# Patient Record
Sex: Male | Born: 1955 | Race: White | Hispanic: No | Marital: Married | Smoking: Never smoker
Health system: Southern US, Community
[De-identification: ages and names within clinical notes are randomized; demographics above are authoritative.]

## PROBLEM LIST (undated history)

## (undated) DIAGNOSIS — E119 Type 2 diabetes mellitus without complications: Secondary | ICD-10-CM

## (undated) DIAGNOSIS — I10 Essential (primary) hypertension: Secondary | ICD-10-CM

## (undated) HISTORY — PX: OTHER SURGICAL HISTORY: SHX169

## (undated) HISTORY — PX: TONSILLECTOMY: SUR1361

## (undated) HISTORY — DX: Essential (primary) hypertension: I10

## (undated) HISTORY — DX: Type 2 diabetes mellitus without complications: E11.9

---

## 2003-09-07 ENCOUNTER — Ambulatory Visit (HOSPITAL_COMMUNITY): Admission: RE | Admit: 2003-09-07 | Discharge: 2003-09-07 | Payer: Self-pay | Admitting: Family Medicine

## 2005-09-05 ENCOUNTER — Ambulatory Visit: Payer: Self-pay | Admitting: Sports Medicine

## 2005-09-05 ENCOUNTER — Observation Stay (HOSPITAL_COMMUNITY): Admission: EM | Admit: 2005-09-05 | Discharge: 2005-09-06 | Payer: Self-pay | Admitting: Emergency Medicine

## 2005-09-24 ENCOUNTER — Encounter: Admission: RE | Admit: 2005-09-24 | Discharge: 2005-09-24 | Payer: Self-pay | Admitting: Family Medicine

## 2009-03-20 ENCOUNTER — Encounter (HOSPITAL_BASED_OUTPATIENT_CLINIC_OR_DEPARTMENT_OTHER): Admission: RE | Admit: 2009-03-20 | Discharge: 2009-05-15 | Payer: Self-pay | Admitting: Internal Medicine

## 2009-03-21 ENCOUNTER — Ambulatory Visit (HOSPITAL_COMMUNITY): Admission: RE | Admit: 2009-03-21 | Discharge: 2009-03-21 | Payer: Self-pay | Admitting: Internal Medicine

## 2009-03-22 ENCOUNTER — Encounter (HOSPITAL_BASED_OUTPATIENT_CLINIC_OR_DEPARTMENT_OTHER): Payer: Self-pay | Admitting: Internal Medicine

## 2009-03-22 ENCOUNTER — Ambulatory Visit: Admission: RE | Admit: 2009-03-22 | Discharge: 2009-03-22 | Payer: Self-pay | Admitting: Internal Medicine

## 2009-03-22 ENCOUNTER — Ambulatory Visit: Payer: Self-pay | Admitting: Vascular Surgery

## 2009-03-25 ENCOUNTER — Emergency Department (HOSPITAL_BASED_OUTPATIENT_CLINIC_OR_DEPARTMENT_OTHER): Admission: EM | Admit: 2009-03-25 | Discharge: 2009-03-25 | Payer: Self-pay | Admitting: Emergency Medicine

## 2010-10-04 LAB — GLUCOSE, CAPILLARY: Glucose-Capillary: 180 mg/dL — ABNORMAL HIGH (ref 70–99)

## 2010-11-16 NOTE — Discharge Summary (Signed)
[name], [name]                ACCOUNT NO.:  [id number]   MEDICAL RECORD NO.:  [id number]          PATIENT TYPE:  INP   LOCATION:  6524                         FACILITY:  [facility]   PHYSICIAN:  Pearlean Brownie, M.D.DATE OF BIRTH:  1955-12-09   DATE OF ADMISSION:  09/05/2005  DATE OF DISCHARGE:  09/06/2005                                 DISCHARGE SUMMARY   PRIMARY CARE PHYSICIAN:  [facility], phone number [phone].   DISCHARGE DIAGNOSES:  1.  Chest pain, likely gastroesophageal reflux disease.  2.  New onset type 2 diabetes mellitus.  3.  Hypertension.  4.  Hyperlipidemia.   PROCEDURE:  Chest x-ray on September 05, 2005, showed no active lung disease and  mild cardiomegaly.   LABORATORY DATA:  Cardiac enzymes were negative times three. EKG was normal  sinus rhythm. Hemoglobin A1c was 11.5, creatinine 0.9.  chest x-ray showed  no acute processes. TSH was 2.043. LFTs were within normal limits. Lipid  panel revealed total cholesterol of 215, triglycerides 154, HDL 39, LDL 145.  D-dimer was within normal limits. Urinalysis negative for protein, nitrite,  or leukocyte esterase. Specific gravity elevated at 1.038.   DISCHARGE MEDICATIONS:  1.  Metformin 500 mg p.o. daily.  2.  Amaryl 2 mg p.o. daily.  3.  Aspirin 81 mg p.o. daily.  4.  Lipitor 40 mg p.o. daily.   BRIEF HISTORY OF PRESENT ILLNESS:  The patient is a 55 year old male with  past medical history significant for hypertension, who had 30 to 45 minutes  of chest discomfort described as severe pressure. The patient was seen at  [facility], was given aspirin, and sent to the emergency department  for further evaluation. The patient noted a history of having been found to  have high cholesterol as well as elevated blood sugars at an insurance  physical several weeks ago. The patient has yet to follow up with his  primary care physician at [facility]. The patient was  admitted to the  hospital for chest pain and to rule out MI.   HOSPITAL COURSE:  Problem #1:  Chest pain. The patient's cardiac enzymes  were negative times three. His EKG was normal sinus rhythm on two occasions.  The patient's chest pain at this point was felt to be secondary to GERD.  Would recommend outpatient exercise treadmill testing for the patient.   Problem #2:  New onset type 2 diabetes mellitus. The patient had an elevated  blood sugar at 389 in the emergency department. Repeat blood sugars remained  elevated as well. The patient's hemoglobin A1c was 11.5.  The patient was  started on Metformin 500 mg daily as well as Amaryl 2 mg p.o. daily. The  patient received diabetic teaching while in the hospital and will be  referred to outpatient diabetic education. The patient was also started on  aspirin 81 mg once daily.   Problem #3:  Hypertension. The patient's blood pressures were 130 systolic  over 80s. The patient's new goal blood pressures were 120/80 with diabetes,  however this can be followed as an outpatient.  Problem #4:  Hyperlipidemia. The patient's LDL of 145 and HDL of 39 are  below goal for someone with diabetes.  The patient was started on Lipitor 40  mg p.o.   DISCHARGE INSTRUCTIONS:  Carbohydrate-modified diet.   DISCHARGE FOLLOWUP:  With [facility], phone number [phone],  for an appointment as previously scheduled on Friday, September 13, 2005.   FOLLOWUP ISSUES:  Outpatient cardiac testing as well as diabetes management.      Benn Moulder, M.D.    ______________________________  Pearlean Brownie, M.D.    MR/MEDQ  D:  09/06/2005  T:  09/07/2005  Job:  [id number]   cc:   [facility]

## 2010-11-16 NOTE — H&P (Signed)
[name], [name]                ACCOUNT NO.:  [id number]   MEDICAL RECORD NO.:  [id number]          PATIENT TYPE:  INP   LOCATION:  6524                         FACILITY:  [facility]   PHYSICIAN:  Pearlean Brownie, M.D.DATE OF BIRTH:  10-Mar-1956   DATE OF ADMISSION:  09/05/2005  DATE OF DISCHARGE:                                HISTORY & PHYSICAL   PRIMARY CARE PHYSICIAN:  [facility].   CHIEF COMPLAINT:  Chest pain.   HISTORY OF PRESENT ILLNESS:  The patient is a 55 year old white male with a  history of hypertension who developed chest discomfort over his sternum  which radiated to his left chest shortly after lunch while driving back to  work.  The patient described the pain as pressure and it lasted for about 45  minutes.  It was not associated with any nausea, vomiting, or diaphoresis.  The patient was seen at Urgent Care and given aspirin before being sent to  the emergency department.  The patient states he has been stressed at work  lately and has been working exceptionally long hours.  He has had episodes  of chest pain in the past two to three years ago after injuring his  shoulder.  He had a thorough workup which included a negative exercise  treadmill test, per the patient, at that time.  Chest pain at that point was  felt to be likely musculoskeletal and/or heartburn.  Also of note, the  patient says he was found to have high cholesterol and increased blood sugar  at a recent insurance physical two to three weeks ago.  He has not followed  up with his primary MD for this yet, although he has an appointment  scheduled for next week.  The patient has been on Norvasc before for his  hypertension, but is currently controlled with diet and exercise.  He is a  non-smoker.   REVIEW OF SYSTEMS:  No nausea or diaphoresis.  No shortness of breath.  No  abdominal pain.  No palpitations.  No headaches.  No polyuria.  No  polydipsia.   PAST MEDICAL HISTORY:  1.   Hypertension.  2.  Questionable hyperlipidemia.  3.  Questionable hyperglycemia.   MEDICATIONS:  None.   ALLERGIES:  No known drug allergies.   FAMILY HISTORY:  Dad with type 2 diabetes mellitus, on oral medications, as  well as hypertension.  He is alive at age 30.  Mom age 6, without  hypertension, diabetes, or heart attacks.  Brother died at age 70 of a MI.   SOCIAL HISTORY:  The patient works as a Visual merchandiser.  He denies  any alcohol, tobacco history, or drug use.  He is currently married.   PHYSICAL EXAMINATION:  VITAL SIGNS:  Temperature 98.7, pulse 82, blood  pressure 144/85, respiratory rate 20, 98% on room air.  GENERAL:  Pleasant, in no acute distress.  HEENT:  Pupils equal, round, reactive to light and accommodation.  Extraocular movements were intact.  Mucous membranes are moist.  Sclerae are  anicteric.  NECK:  No thyromegaly or thyroid nodules.  No carotid  bruits.  LUNGS:  Clear to auscultation bilaterally.  CHEST:  Chest wall nontender to palpation.  CARDIOVASCULAR:  Regular rate and rhythm, no murmurs, rubs, or gallops.  ABDOMEN:  Positive bowel sounds, soft, nontender, nondistended, no  hepatosplenomegaly.  No mid-epigastric tenderness.  EXTREMITIES:  2+ dorsalis pedis pulses bilaterally, 2+ radial pulses  bilaterally.  No cyanosis, clubbing, or edema.  No foot ulcerations.  NEUROLOGIC:  Alert and oriented x3.  Cranial nerves II-XII intact.  The  patient with 5/5 upper and lower extremity strength bilaterally.  DTR 2+  bilaterally symmetric.   LABORATORY DATA:  Sodium 136, potassium 4.4, chloride 105, bicarbonate 26,  BUN 18, creatinine 0.9, glucose 387.  Point-of-care cardiac enzymes:  Myoglobin 66.1, CK-MB less than 1, troponin less than 0.05.  EKG with normal  sinus rhythm at 86 beats per minute.  Chest x-ray showed no acute  cardiopulmonary process.  Urinalysis is still pending.   ASSESSMENT AND PLAN:  A 55 year old male with atypical angina  and  hyperglycemia.  1.  Chest pain.  Atypical angina on his left side, but not associated with      exertional_____________.  May be brought on by stress.  Differential      diagnosis includes myocardial infarction, coronary artery disease,      angina, gastroesophageal reflux disease, or chest pain secondary to      anxiety or stress.  Risk factors for the patient include hypertension,      possible hyperlipidemia, likely diabetes mellitus, as well as a positive      family history of early heart disease.  No tobacco use.  We will admit      the patient to a telemetry bed, cycle his cardiac enzymes.  We will      recheck his EKG in the morning.  The patient is meeting risks for      coronary artery disease and will need outpatient exercise treadmill test      if myocardial infarction is ruled out.  2.  Hyperglycemia.  The patient likely with type 2 diabetes.  Received 6      units of Novolog in the emergency department.  We will follow his CBG's      q.a.c. and q.h.s.  We will check a hemoglobin A1C, as well as TSH.  His      urinalysis is pending to look for protein and ketones.  I will consider      starting Metformin in the morning.  3.  Hyperlipidemia.  We will check a fasting lipid panel in the morning.      His LDL goal is likely 100 with type 2 diabetes.  We will also check a      CMET to make sure that his liver function tests are okay before starting      possible statin therapy.  4.  Hypertension.  The patient was given Toprol XL 25 mg x1 in the emergency      room.  We will follow his blood pressures in the hospital, and continue      the Toprol XL if needed.  The patient will be on low salt diet.  5.  Gastrointestinal.  The patient with no prior history of gastroesophageal      reflux disease, although this is certainly a possibility.  We will start      him on Protonix.      Benn Moulder, M.D.    ______________________________ Pearlean Brownie, M.D.    MR/MEDQ  D:  09/05/2005  T:  09/05/2005  Job:  [id number]

## 2013-05-23 DIAGNOSIS — E113299 Type 2 diabetes mellitus with mild nonproliferative diabetic retinopathy without macular edema, unspecified eye: Secondary | ICD-10-CM | POA: Insufficient documentation

## 2013-05-23 DIAGNOSIS — I10 Essential (primary) hypertension: Secondary | ICD-10-CM | POA: Insufficient documentation

## 2018-12-16 ENCOUNTER — Encounter (HOSPITAL_BASED_OUTPATIENT_CLINIC_OR_DEPARTMENT_OTHER): Payer: 59 | Attending: Physician Assistant

## 2018-12-16 DIAGNOSIS — E1140 Type 2 diabetes mellitus with diabetic neuropathy, unspecified: Secondary | ICD-10-CM | POA: Insufficient documentation

## 2018-12-16 DIAGNOSIS — E11621 Type 2 diabetes mellitus with foot ulcer: Secondary | ICD-10-CM | POA: Insufficient documentation

## 2018-12-16 DIAGNOSIS — L97512 Non-pressure chronic ulcer of other part of right foot with fat layer exposed: Secondary | ICD-10-CM | POA: Diagnosis not present

## 2018-12-16 DIAGNOSIS — I10 Essential (primary) hypertension: Secondary | ICD-10-CM | POA: Diagnosis not present

## 2018-12-16 DIAGNOSIS — Z794 Long term (current) use of insulin: Secondary | ICD-10-CM | POA: Insufficient documentation

## 2018-12-23 ENCOUNTER — Ambulatory Visit (HOSPITAL_COMMUNITY)
Admission: RE | Admit: 2018-12-23 | Discharge: 2018-12-23 | Disposition: A | Discharge status: Home / Self Care | Payer: 59 | Source: Ambulatory Visit | Attending: Physician Assistant | Admitting: Physician Assistant

## 2018-12-23 ENCOUNTER — Other Ambulatory Visit (HOSPITAL_COMMUNITY): Payer: Self-pay | Admitting: Physician Assistant

## 2018-12-23 ENCOUNTER — Other Ambulatory Visit: Payer: Self-pay

## 2018-12-23 DIAGNOSIS — L97512 Non-pressure chronic ulcer of other part of right foot with fat layer exposed: Secondary | ICD-10-CM | POA: Diagnosis present

## 2018-12-23 DIAGNOSIS — E11621 Type 2 diabetes mellitus with foot ulcer: Secondary | ICD-10-CM | POA: Diagnosis not present

## 2018-12-25 DIAGNOSIS — E11621 Type 2 diabetes mellitus with foot ulcer: Secondary | ICD-10-CM | POA: Diagnosis not present

## 2018-12-29 DIAGNOSIS — E11621 Type 2 diabetes mellitus with foot ulcer: Secondary | ICD-10-CM | POA: Diagnosis not present

## 2018-12-30 ENCOUNTER — Other Ambulatory Visit: Payer: Self-pay

## 2018-12-30 ENCOUNTER — Encounter (HOSPITAL_BASED_OUTPATIENT_CLINIC_OR_DEPARTMENT_OTHER): Payer: 59 | Attending: Physician Assistant

## 2018-12-30 DIAGNOSIS — S91001A Unspecified open wound, right ankle, initial encounter: Secondary | ICD-10-CM | POA: Insufficient documentation

## 2018-12-30 DIAGNOSIS — I10 Essential (primary) hypertension: Secondary | ICD-10-CM | POA: Insufficient documentation

## 2018-12-30 DIAGNOSIS — E1140 Type 2 diabetes mellitus with diabetic neuropathy, unspecified: Secondary | ICD-10-CM | POA: Diagnosis not present

## 2018-12-30 DIAGNOSIS — E11621 Type 2 diabetes mellitus with foot ulcer: Secondary | ICD-10-CM | POA: Diagnosis present

## 2018-12-30 DIAGNOSIS — X58XXXA Exposure to other specified factors, initial encounter: Secondary | ICD-10-CM | POA: Diagnosis not present

## 2018-12-30 DIAGNOSIS — Z794 Long term (current) use of insulin: Secondary | ICD-10-CM | POA: Insufficient documentation

## 2018-12-30 DIAGNOSIS — L97412 Non-pressure chronic ulcer of right heel and midfoot with fat layer exposed: Secondary | ICD-10-CM | POA: Insufficient documentation

## 2019-01-06 DIAGNOSIS — E11621 Type 2 diabetes mellitus with foot ulcer: Secondary | ICD-10-CM | POA: Diagnosis not present

## 2019-01-14 DIAGNOSIS — E11621 Type 2 diabetes mellitus with foot ulcer: Secondary | ICD-10-CM | POA: Diagnosis not present

## 2019-01-20 DIAGNOSIS — E11621 Type 2 diabetes mellitus with foot ulcer: Secondary | ICD-10-CM | POA: Diagnosis not present

## 2019-02-01 ENCOUNTER — Encounter (HOSPITAL_BASED_OUTPATIENT_CLINIC_OR_DEPARTMENT_OTHER): Payer: 59 | Attending: Internal Medicine

## 2019-02-01 DIAGNOSIS — I10 Essential (primary) hypertension: Secondary | ICD-10-CM | POA: Insufficient documentation

## 2019-02-01 DIAGNOSIS — L97512 Non-pressure chronic ulcer of other part of right foot with fat layer exposed: Secondary | ICD-10-CM | POA: Insufficient documentation

## 2019-02-01 DIAGNOSIS — L84 Corns and callosities: Secondary | ICD-10-CM | POA: Insufficient documentation

## 2019-02-01 DIAGNOSIS — E11621 Type 2 diabetes mellitus with foot ulcer: Secondary | ICD-10-CM | POA: Insufficient documentation

## 2019-02-01 DIAGNOSIS — X58XXXA Exposure to other specified factors, initial encounter: Secondary | ICD-10-CM | POA: Insufficient documentation

## 2019-02-01 DIAGNOSIS — S91001A Unspecified open wound, right ankle, initial encounter: Secondary | ICD-10-CM | POA: Diagnosis not present

## 2019-02-02 ENCOUNTER — Other Ambulatory Visit: Payer: Self-pay

## 2019-02-08 DIAGNOSIS — E11621 Type 2 diabetes mellitus with foot ulcer: Secondary | ICD-10-CM | POA: Diagnosis not present

## 2019-02-22 DIAGNOSIS — E11621 Type 2 diabetes mellitus with foot ulcer: Secondary | ICD-10-CM | POA: Diagnosis not present

## 2019-02-23 DIAGNOSIS — H52222 Regular astigmatism, left eye: Secondary | ICD-10-CM | POA: Insufficient documentation

## 2019-02-23 DIAGNOSIS — H25812 Combined forms of age-related cataract, left eye: Secondary | ICD-10-CM | POA: Insufficient documentation

## 2019-12-15 NOTE — Progress Notes (Signed)
Referring-Stephen Olen Pel MD Reason for referral-Chest pain  HPI: 64 yo male for evaluation of CP at request of Orpah Melter MD. Seen at [facility] in past for PVCs.  Patient states that for the past 1 year he has noticed increased fatigue.  He has some dyspnea on exertion but no orthopnea, PND, pedal edema or syncope.  In the past several months he has had occasional substernal chest pain.  It does not radiate nor are there associated symptoms.  It is not exertional.  Lasts approximately 5 minutes and resolve spontaneously.  He does not have exertional chest pain.  Cardiology now asked to evaluate.  Current Outpatient Medications  Medication Sig Dispense Refill   lisinopril (ZESTRIL) 20 MG tablet Take 20 mg by mouth daily.     Semaglutide,0.25 or 0.5MG /DOS, (OZEMPIC, 0.25 OR 0.5 MG/DOSE,) 2 MG/1.5ML SOPN Inject 0.25 mg into the skin once a week.     sitaGLIPtin-metformin (JANUMET) 50-1000 MG tablet Take 1 tablet by mouth 2 (two) times daily.     aspirin EC 81 MG tablet Take 1 tablet (81 mg total) by mouth daily. Swallow whole. 90 tablet 3   No current facility-administered medications for this visit.    No Known Allergies   Past Medical History:  Diagnosis Date   Diabetes mellitus without complication ([place])    Hypertension     Past Surgical History:  Procedure Laterality Date   Arthroscopic knee surgery      Social History   Socioeconomic History   Marital status: Married    Spouse name: Not on file   Number of children: 4   Years of education: Not on file   Highest education level: Not on file  Occupational History   Not on file  Tobacco Use   Smoking status: Never Smoker   Smokeless tobacco: Never Used   Tobacco comment: expose to passive smoke  Vaping Use   Vaping Use: Never used  Substance and Sexual Activity   Alcohol use: Yes    Comment: occasionally   Drug use: Never   Sexual activity: Yes  Other Topics Concern   Not on file  Social  History Narrative   Not on file   Social Determinants of Health   Financial Resource Strain:    Difficulty of Paying Living Expenses:   Food Insecurity:    Worried About Charity fundraiser in the Last Year:    Arboriculturist in the Last Year:   Transportation Needs:    Film/video editor (Medical):    Lack of Transportation (Non-Medical):   Physical Activity:    Days of Exercise per Week:    Minutes of Exercise per Session:   Stress:    Feeling of Stress :   Social Connections:    Frequency of Communication with Friends and Family:    Frequency of Social Gatherings with Friends and Family:    Attends Religious Services:    Active Member of Clubs or Organizations:    Attends Music therapist:    Marital Status:   Intimate Partner Violence:    Fear of Current or Ex-Partner:    Emotionally Abused:    Physically Abused:    Sexually Abused:     Family History  Problem Relation Age of Onset   CAD Father    CAD Brother     ROS: no fevers or chills, productive cough, hemoptysis, dysphasia, odynophagia, melena, hematochezia, dysuria, hematuria, rash, seizure activity, orthopnea, PND, pedal edema, claudication.  Remaining systems are negative.  Physical Exam:   Blood pressure (!) 140/96, pulse 76, height 5\' 10"  (1.778 m), weight 222 lb (100.7 kg), SpO2 95 %.  General:  Well developed/well nourished in NAD Skin warm/dry Patient not depressed No peripheral clubbing Back-normal HEENT-normal/normal eyelids Neck supple/normal carotid upstroke bilaterally; no bruits; no JVD; no thyromegaly chest - CTA/ normal expansion CV - RRR/normal S1 and S2; no murmurs, rubs or gallops;  PMI nondisplaced Abdomen -NT/ND, no HSM, no mass, + bowel sounds, no bruit 2+ femoral pulses, no bruits Ext-no edema, chords, 2+ DP Neuro-grossly nonfocal  ECG -occasional PVC, nonspecific ST changes.  Personally reviewed  A/P  1 CP-symptoms are somewhat  atypical.  However he has multiple risk factors including strong family history of coronary disease as well as diabetes mellitus.  I will arrange a CTA to further assess.  2 hypertension-BP elevated; however he is not taking his lisinopril regularly.  He will begin and we will follow blood pressure and increase medications as needed.  3 hyperlipidemia-we will obtain most recent lipids from primary care.  Low threshold for statin given diabetes mellitus and family history.  4 DM-per primary care.  , MD

## 2019-12-16 ENCOUNTER — Encounter: Payer: Self-pay | Admitting: *Deleted

## 2019-12-16 DIAGNOSIS — R079 Chest pain, unspecified: Secondary | ICD-10-CM

## 2019-12-16 DIAGNOSIS — R5383 Other fatigue: Secondary | ICD-10-CM | POA: Insufficient documentation

## 2019-12-16 NOTE — Progress Notes (Signed)
Notes fax to [facility] [phone]

## 2019-12-22 ENCOUNTER — Other Ambulatory Visit: Payer: Self-pay

## 2019-12-22 ENCOUNTER — Encounter: Payer: Self-pay | Admitting: Cardiology

## 2019-12-22 ENCOUNTER — Ambulatory Visit (INDEPENDENT_AMBULATORY_CARE_PROVIDER_SITE_OTHER): Payer: 59 | Admitting: Cardiology

## 2019-12-22 VITALS — BP 140/96 | HR 76 | Ht 70.0 in | Wt 222.0 lb

## 2019-12-22 DIAGNOSIS — E7800 Pure hypercholesterolemia, unspecified: Secondary | ICD-10-CM

## 2019-12-22 DIAGNOSIS — I10 Essential (primary) hypertension: Secondary | ICD-10-CM

## 2019-12-22 DIAGNOSIS — R079 Chest pain, unspecified: Secondary | ICD-10-CM | POA: Diagnosis not present

## 2019-12-22 DIAGNOSIS — R072 Precordial pain: Secondary | ICD-10-CM | POA: Diagnosis not present

## 2019-12-22 MED ORDER — ASPIRIN EC 81 MG PO TBEC: 81.0000 mg | DELAYED_RELEASE_TABLET | Freq: Every day | ORAL | 3 refills | Status: AC

## 2019-12-22 MED ORDER — METOPROLOL TARTRATE 100 MG PO TABS: ORAL_TABLET | ORAL | 0 refills | Status: AC

## 2019-12-22 NOTE — Patient Instructions (Signed)
Medication Instructions:   START ASPIRIN 81 MG ONCE DAILY  *If you need a refill on your cardiac medications before your next appointment, please call your pharmacy*   Lab Work: If you have labs (blood work) drawn today and your tests are completely normal, you will receive your results only by:  [place] (if you have MyChart) OR  A paper copy in the mail If you have any lab test that is abnormal or we need to change your treatment, we will call you to review the results.   Testing/Procedures:  Your cardiac CT will be scheduled at one of the below locations:   [facility] [address] [place], [place] [address] [phone]  If scheduled at [facility], please arrive at the [facility] main entrance of [facility] 30 minutes prior to test start time. Proceed to the [facility] Radiology Department (first floor) to check-in and test prep.  Please follow these instructions carefully (unless otherwise directed):  Hold all erectile dysfunction medications at least 3 days (72 hrs) prior to test.  On the Night Before the Test:  Be sure to Drink plenty of water.  Do not consume any caffeinated/decaffeinated beverages or chocolate 12 hours prior to your test.  Do not take any antihistamines 12 hours prior to your test.  If the patient has contrast allergy:  On the Day of the Test:  Drink plenty of water. Do not drink any water within one hour of the test.  Do not eat any food 4 hours prior to the test.  You may take your regular medications prior to the test.   Take metoprolol (Lopressor) 100 MG two hours prior to test.      After the Test:  Drink plenty of water.  After receiving IV contrast, you may experience a mild flushed feeling. This is normal.  On occasion, you may experience a mild rash up to 24 hours after the test. This is not dangerous. If this occurs, you can take Benadryl 25 mg and increase your fluid intake.  If  you experience trouble breathing, this can be serious. If it is severe call 911 IMMEDIATELY. If it is mild, please call our office.  If you take any of these medications: Glipizide/Metformin, Avandament, Glucavance, please do not take 48 hours after completing test unless otherwise instructed.   Once we have confirmed authorization from your insurance company, we will call you to set up a date and time for your test.   For non-scheduling related questions, please contact the cardiac imaging nurse navigator should you have any questions/concerns: Marchia Bond, Cardiac Imaging Nurse Navigator Burley Saver, Interim Cardiac Imaging Nurse [place] [facility] Direct Office Dial: [phone]   For scheduling needs, including cancellations and rescheduling, please call [phone].      Follow-Up: At [facility], you and your health needs are our priority.  As part of our continuing mission to provide you with exceptional heart care, we have created designated Provider Care Teams.  These Care Teams include your primary Cardiologist (physician) and Advanced Practice Providers (APPs -  Physician Assistants and Nurse Practitioners) who all work together to provide you with the care you need, when you need it.  We recommend signing up for the patient portal called "MyChart".  Sign up information is provided on this After Visit Summary.  MyChart is used to connect with patients for Virtual Visits (Telemedicine).  Patients are able to view lab/test results, encounter  notes, upcoming appointments, etc.  Non-urgent messages can be sent to your provider as well.   To learn more about what you can do with MyChart, go to [email or web address].    Your next appointment:   3 month(s)  The format for your next appointment:   In Person  Provider:   Kirk Ruths, MD

## 2020-01-05 ENCOUNTER — Telehealth: Payer: Self-pay | Admitting: *Deleted

## 2020-01-05 DIAGNOSIS — E7800 Pure hypercholesterolemia, unspecified: Secondary | ICD-10-CM

## 2020-01-05 MED ORDER — ROSUVASTATIN CALCIUM 20 MG PO TABS: 20.0000 mg | ORAL_TABLET | Freq: Every day | ORAL | 3 refills | Status: DC

## 2020-01-05 NOTE — Telephone Encounter (Signed)
Lab work drawn 05/04/19 at [facility] reviewed by dr Jens Som, LDL=95. Dr Jens Som would like patient to start crestor 20 mg once daily with lipid/liver lab work in 12 weeks.   Spoke with pt, Aware of dr Ludwig Clarks recommendations. New script sent to the pharmacy AND Lab orders mailed to the pt

## 2020-01-13 ENCOUNTER — Other Ambulatory Visit: Payer: Self-pay | Admitting: *Deleted

## 2020-01-13 DIAGNOSIS — R079 Chest pain, unspecified: Secondary | ICD-10-CM

## 2020-01-13 DIAGNOSIS — R072 Precordial pain: Secondary | ICD-10-CM

## 2020-01-19 ENCOUNTER — Telehealth: Payer: Self-pay | Admitting: Cardiology

## 2020-01-19 NOTE — Telephone Encounter (Signed)
Pt mentionned that he is claustrophobic and might need something to calm him down. Has CT Scan coming up on 01/24/20

## 2020-01-19 NOTE — Telephone Encounter (Signed)
Will forward to Dr Crenshaw for review ./cy 

## 2020-01-20 NOTE — Telephone Encounter (Signed)
CTA is quick and should not require sedation [name]

## 2020-01-20 NOTE — Telephone Encounter (Signed)
Spoke with pt, Aware of dr crenshaw's recommendations.  °

## 2020-01-21 ENCOUNTER — Encounter: Payer: Self-pay | Admitting: *Deleted

## 2020-01-21 ENCOUNTER — Telehealth (HOSPITAL_COMMUNITY): Payer: Self-pay | Admitting: *Deleted

## 2020-01-21 LAB — BASIC METABOLIC PANEL
BUN/Creatinine Ratio: 22 (ref 10–24)
BUN: 27 mg/dL (ref 8–27)
CO2: 24 mmol/L (ref 20–29)
Calcium: 9.8 mg/dL (ref 8.6–10.2)
Chloride: 102 mmol/L (ref 96–106)
Creatinine, Ser: 1.21 mg/dL (ref 0.76–1.27)
GFR calc Af Amer: 73 mL/min/{1.73_m2} (ref >59)
GFR calc non Af Amer: 63 mL/min/{1.73_m2} (ref >59)
Glucose: 269 mg/dL — ABNORMAL HIGH (ref 65–99)
Potassium: 4.8 mmol/L (ref 3.5–5.2)
Sodium: 141 mmol/L (ref 134–144)

## 2020-01-21 NOTE — Telephone Encounter (Signed)
Reaching out to patient to offer assistance regarding upcoming cardiac imaging study; pt verbalizes understanding of appt date/time, parking situation and where to check in, pre-test NPO status and medications ordered, and verified current allergies; name and call back number provided for further questions should they arise ° °[name] Tai RN Navigator Cardiac Imaging °[facility] °[phone] office °[phone] cell ° °

## 2020-01-24 ENCOUNTER — Encounter (HOSPITAL_COMMUNITY): Payer: Self-pay

## 2020-01-24 ENCOUNTER — Ambulatory Visit (HOSPITAL_COMMUNITY)
Admission: RE | Admit: 2020-01-24 | Discharge: 2020-01-24 | Disposition: A | Discharge status: Home / Self Care | Payer: 59 | Source: Ambulatory Visit | Attending: Cardiology | Admitting: Cardiology

## 2020-01-24 ENCOUNTER — Other Ambulatory Visit: Payer: Self-pay

## 2020-01-24 DIAGNOSIS — R072 Precordial pain: Secondary | ICD-10-CM | POA: Diagnosis present

## 2020-01-24 MED ORDER — IOHEXOL 350 MG/ML SOLN
100.0000 mL | Freq: Once | INTRAVENOUS | Status: AC | PRN
  Administered 2020-01-24: 100 mL via INTRAVENOUS

## 2020-01-24 MED ORDER — NITROGLYCERIN 0.4 MG SL SUBL
SUBLINGUAL_TABLET | SUBLINGUAL | Status: AC
  Filled 2020-01-24: qty 2

## 2020-01-24 MED ORDER — NITROGLYCERIN 0.4 MG SL SUBL
0.8000 mg | SUBLINGUAL_TABLET | Freq: Once | SUBLINGUAL | Status: AC
  Administered 2020-01-24: 0.8 mg via SUBLINGUAL

## 2020-01-25 ENCOUNTER — Encounter (HOSPITAL_BASED_OUTPATIENT_CLINIC_OR_DEPARTMENT_OTHER): Payer: 59 | Attending: Physician Assistant | Admitting: Internal Medicine

## 2020-01-25 ENCOUNTER — Encounter (HOSPITAL_BASED_OUTPATIENT_CLINIC_OR_DEPARTMENT_OTHER): Payer: 59 | Admitting: Internal Medicine

## 2020-01-25 DIAGNOSIS — I10 Essential (primary) hypertension: Secondary | ICD-10-CM | POA: Diagnosis not present

## 2020-01-25 DIAGNOSIS — E1136 Type 2 diabetes mellitus with diabetic cataract: Secondary | ICD-10-CM | POA: Insufficient documentation

## 2020-01-25 DIAGNOSIS — E1141 Type 2 diabetes mellitus with diabetic mononeuropathy: Secondary | ICD-10-CM | POA: Diagnosis not present

## 2020-01-25 DIAGNOSIS — E11621 Type 2 diabetes mellitus with foot ulcer: Secondary | ICD-10-CM | POA: Diagnosis present

## 2020-01-25 DIAGNOSIS — L98499 Non-pressure chronic ulcer of skin of other sites with unspecified severity: Secondary | ICD-10-CM | POA: Insufficient documentation

## 2020-01-25 NOTE — Progress Notes (Signed)
[name], [name] ([id number]) Visit Report for 01/25/2020 Abuse/Suicide Risk Screen Details Patient Name: Date of Service: [name], [name] 01/25/2020 2:45 PM Medical Record Number: [id number] Patient Account Number: [id number] Date of Birth/Sex: Treating RN: 09/08/1955 (64 y.o. [name] Primary Care Malky Rudzinski: Cynda Familia Other Clinician: Referring Briar Witherspoon: Treating Cigi Bega/Extender: Willia Craze in Treatment: 0 Abuse/Suicide Risk Screen Items Answer ABUSE RISK SCREEN: Has anyone close to you tried to hurt or harm you recentlyo No Do you feel uncomfortable with anyone in your familyo No Has anyone forced you do things that you didnt want to doo No Electronic Signature(s) Signed: 01/25/2020 5:47:23 PM By: Zenaida Deed RN, BSN Entered By: Zenaida Deed on 01/25/2020 15:14:54 -------------------------------------------------------------------------------- Activities of Daily Living Details Patient Name: Date of Service: [name], [name] 01/25/2020 2:45 PM Medical Record Number: [id number] Patient Account Number: [id number] Date of Birth/Sex: Treating RN: 10/30/1955 (64 y.o. [name] Primary Care Coreena Rubalcava: Cynda Familia Other Clinician: Referring Trevino Wyatt: Treating Triston Skare/Extender: Willia Craze in Treatment: 0 Activities of Daily Living Items Answer Activities of Daily Living (Please select one for each item) Drive Automobile Completely Able T Medications ake Completely Able Use T elephone Completely Able Care for Appearance Completely Able Use T oilet Completely Able Bath / Shower Completely Able Dress Self Completely Able Feed Self Completely Able Walk Completely Able Get In / Out Bed Completely Able Housework Completely Able Prepare Meals Completely Able Handle Money Completely Able Shop for Self Completely Able Electronic Signature(s) Signed: 01/25/2020 5:47:23 PM By: Zenaida Deed RN,  BSN Entered By: Zenaida Deed on 01/25/2020 15:15:14 -------------------------------------------------------------------------------- Education Screening Details Patient Name: Date of Service: [name], [name] 01/25/2020 2:45 PM Medical Record Number: [id number] Patient Account Number: [id number] Date of Birth/Sex: Treating RN: 1956/05/31 (64 y.o. [name] Primary Care Seward Coran: Cynda Familia Other Clinician: Referring Tacuma Graffam: Treating Skylier Kretschmer/Extender: Willia Craze in Treatment: 0 Primary Learner Assessed: Patient Learning Preferences/Education Level/Primary Language Learning Preference: Explanation, Demonstration, Printed Material Highest Education Level: High School Preferred Language: English Cognitive Barrier Language Barrier: No Translator Needed: No Memory Deficit: No Emotional Barrier: No Cultural/Religious Beliefs Affecting Medical Care: No Physical Barrier Impaired Vision: No Impaired Hearing: No Decreased Hand dexterity: No Knowledge/Comprehension Knowledge Level: High Comprehension Level: High Ability to understand written instructions: High Ability to understand verbal instructions: High Motivation Anxiety Level: Calm Cooperation: Cooperative Education Importance: Acknowledges Need Interest in Health Problems: Asks Questions Perception: Coherent Willingness to Engage in Self-Management High Activities: Readiness to Engage in Self-Management High Activities: Electronic Signature(s) Signed: 01/25/2020 5:47:23 PM By: Zenaida Deed RN, BSN Entered By: Zenaida Deed on 01/25/2020 15:15:50 -------------------------------------------------------------------------------- Fall Risk Assessment Details Patient Name: Date of Service: [name], [name] 01/25/2020 2:45 PM Medical Record Number: [id number] Patient Account Number: [id number] Date of Birth/Sex: Treating RN: January 19, 1956 (64 y.o. [name] Primary Care  Tayra Dawe: Cynda Familia Other Clinician: Referring Demarie Hyneman: Treating Laverna Dossett/Extender: Willia Craze in Treatment: 0 Fall Risk Assessment Items Have you had 2 or more falls in the last 12 monthso 0 No Have you had any fall that resulted in injury in the last 12 monthso 0 No FALLS RISK SCREEN History of falling - immediate or within 3 months 0 No Secondary diagnosis (Do you have 2 or more medical diagnoseso) 0 No Ambulatory aid None/bed rest/wheelchair/nurse 0 Yes Crutches/cane/walker 0 No Furniture 0 No Intravenous therapy Access/Saline/Heparin Lock 0 No Gait/Transferring Normal/ bed rest/ wheelchair 0 Yes Weak (  short steps with or without shuffle, stooped but able to lift head while walking, may seek 0 No support from furniture) Impaired (short steps with shuffle, may have difficulty arising from chair, head down, impaired 0 No balance) Mental Status Oriented to own ability 0 Yes Electronic Signature(s) Signed: 01/25/2020 5:47:23 PM By: Zenaida Deed RN, BSN Entered By: Zenaida Deed on 01/25/2020 15:16:00 -------------------------------------------------------------------------------- Foot Assessment Details Patient Name: Date of Service: [name], [name] 01/25/2020 2:45 PM Medical Record Number: [id number] Patient Account Number: [id number] Date of Birth/Sex: Treating RN: 06-05-1956 (64 y.o. [name] Primary Care Quinn Quam: Cynda Familia Other Clinician: Referring Monica Zahler: Treating Jeromey Kruer/Extender: Willia Craze in Treatment: 0 Foot Assessment Items Site Locations + = Sensation present, - = Sensation absent, C = Callus, U = Ulcer R = Redness, W = Warmth, M = Maceration, PU = Pre-ulcerative lesion F = Fissure, S = Swelling, D = Dryness Assessment Right: Left: Other Deformity: No No Prior Foot Ulcer: Yes No Prior Amputation: No No Charcot Joint: No No Ambulatory Status: Ambulatory Without  Help Gait: Steady Electronic Signature(s) Signed: 01/25/2020 5:47:23 PM By: Zenaida Deed RN, BSN Entered By: Zenaida Deed on 01/25/2020 15:17:39 -------------------------------------------------------------------------------- Nutrition Risk Screening Details Patient Name: Date of Service: [name], [name] 01/25/2020 2:45 PM Medical Record Number: [id number] Patient Account Number: [id number] Date of Birth/Sex: Treating RN: 02-23-1956 (64 y.o. [name] Primary Care Keriann Rankin: Cynda Familia Other Clinician: Referring Cameron Katayama: Treating Jacia Sickman/Extender: Willia Craze in Treatment: 0 Height (in): 70 Weight (lbs): 222 Body Mass Index (BMI): 31.9 Nutrition Risk Screening Items Score Screening NUTRITION RISK SCREEN: I have an illness or condition that made me change the kind and/or amount of food I eat 0 No I eat fewer than two meals per day 0 No I eat few fruits and vegetables, or milk products 0 No I have three or more drinks of beer, liquor or wine almost every day 0 No I have tooth or mouth problems that make it hard for me to eat 0 No I don't always have enough money to buy the food I need 0 No I eat alone most of the time 0 No I take three or more different prescribed or over-the-counter drugs a day 1 Yes Without wanting to, I have lost or gained 10 pounds in the last six months 0 No I am not always physically able to shop, cook and/or feed myself 0 No Nutrition Protocols Good Risk Protocol 0 No interventions needed Moderate Risk Protocol High Risk Proctocol Risk Level: Good Risk Score: 1 Electronic Signature(s) Signed: 01/25/2020 5:47:23 PM By: Zenaida Deed RN, BSN Entered By: Zenaida Deed on 01/25/2020 15:16:35

## 2020-01-26 ENCOUNTER — Ambulatory Visit (HOSPITAL_COMMUNITY)
Admission: RE | Admit: 2020-01-26 | Discharge: 2020-01-26 | Disposition: A | Discharge status: Home / Self Care | Payer: 59 | Source: Ambulatory Visit | Attending: Internal Medicine | Admitting: Internal Medicine

## 2020-01-26 ENCOUNTER — Telehealth: Payer: Self-pay | Admitting: *Deleted

## 2020-01-26 ENCOUNTER — Other Ambulatory Visit (HOSPITAL_COMMUNITY): Payer: Self-pay | Admitting: Internal Medicine

## 2020-01-26 ENCOUNTER — Other Ambulatory Visit: Payer: Self-pay

## 2020-01-26 DIAGNOSIS — M869 Osteomyelitis, unspecified: Secondary | ICD-10-CM

## 2020-01-26 DIAGNOSIS — L97509 Non-pressure chronic ulcer of other part of unspecified foot with unspecified severity: Secondary | ICD-10-CM | POA: Insufficient documentation

## 2020-01-26 DIAGNOSIS — E1169 Type 2 diabetes mellitus with other specified complication: Secondary | ICD-10-CM | POA: Diagnosis present

## 2020-01-26 DIAGNOSIS — E11621 Type 2 diabetes mellitus with foot ulcer: Secondary | ICD-10-CM

## 2020-01-26 DIAGNOSIS — E7800 Pure hypercholesterolemia, unspecified: Secondary | ICD-10-CM

## 2020-01-26 MED ORDER — ROSUVASTATIN CALCIUM 40 MG PO TABS: 40.0000 mg | ORAL_TABLET | Freq: Every day | ORAL | 3 refills | Status: DC

## 2020-01-26 NOTE — Telephone Encounter (Addendum)
°  Spoke with pt, Aware of dr Ludwig Clarks recommendations. New script sent to the pharmacy and Lab orders mailed to the pt   ----- Message from Lewayne Bunting, MD sent at 01/24/2020  5:45 PM EDT ----- Mild CAD; would treat with ASA 81 mg daily and increase crestor to 40 mg daily; lipids and liver 12 weeks Olga Millers

## 2020-01-28 ENCOUNTER — Encounter (HOSPITAL_BASED_OUTPATIENT_CLINIC_OR_DEPARTMENT_OTHER): Payer: 59 | Admitting: Internal Medicine

## 2020-01-28 NOTE — Progress Notes (Signed)
AGNES, PROBERT ([id number]) Visit Report for 01/25/2020 Allergy List Details Patient Name: Date of Service: [place], [place] 01/25/2020 2:45 PM Medical Record Number: [id number] Patient Account Number: [id number] Date of Birth/Sex: Treating RN: 1956-06-26 (64 y.o. [name] Primary Care Rashad Auld: Cynda Familia Other Clinician: Referring Zurri Rudden: Treating Anayla Giannetti/Extender: Willia Craze in Treatment: 0 Allergies Active Allergies No Known Drug Allergies Allergy Notes Electronic Signature(s) Signed: 01/25/2020 5:47:23 PM By: Zenaida Deed RN, BSN Entered By: Zenaida Deed on 01/25/2020 15:07:46 -------------------------------------------------------------------------------- Arrival Information Details Patient Name: Date of Service: [name], [name] 01/25/2020 2:45 PM Medical Record Number: [id number] Patient Account Number: [id number] Date of Birth/Sex: Treating RN: 03/11/1956 (64 y.o. [name] Primary Care Catheline Hixon: Cynda Familia Other Clinician: Referring Cletis Muma: Treating Zena Vitelli/Extender: Willia Craze in Treatment: 0 Visit Information Patient Arrived: Ambulatory Arrival Time: 15:00 Accompanied By: self Transfer Assistance: None Patient Identification Verified: Yes Secondary Verification Process Completed: Yes Patient Requires Transmission-Based Precautions: No Patient Has Alerts: No History Since Last Visit Has Dressing in Place as Prescribed: Yes Electronic Signature(s) Signed: 01/25/2020 5:47:23 PM By: Zenaida Deed RN, BSN Entered By: Zenaida Deed on 01/25/2020 15:03:59 -------------------------------------------------------------------------------- Clinic Level of Care Assessment Details Patient Name: Date of Service: [place], [name] 01/25/2020 2:45 PM Medical Record Number: [id number] Patient Account Number: [id number] Date of Birth/Sex: Treating RN: 1956-04-12 (64 y.o. Melonie [place] Primary Care Adriauna Campton: Cynda Familia Other Clinician: Referring Safal Halderman: Treating Ziva Nunziata/Extender: Willia Craze in Treatment: 0 Clinic Level of Care Assessment Items TOOL 1 Quantity Score X- 1 0 Use when EandM and Procedure is performed on INITIAL visit ASSESSMENTS - Nursing Assessment / Reassessment []  - 0 General Physical Exam (combine w/ comprehensive assessment (listed just below) when performed on new pt. evals) []  - 0 Comprehensive Assessment (HX, ROS, Risk Assessments, Wounds Hx, etc.) ASSESSMENTS - Wound and Skin Assessment / Reassessment []  - 0 Dermatologic / Skin Assessment (not related to wound area) ASSESSMENTS - Ostomy and/or Continence Assessment and Care []  - 0 Incontinence Assessment and Management []  - 0 Ostomy Care Assessment and Management (repouching, etc.) PROCESS - Coordination of Care X - Simple Patient / Family Education for ongoing care 1 15 []  - 0 Complex (extensive) Patient / Family Education for ongoing care X- 1 10 Staff obtains , Records, T Results / Process Orders est []  - 0 Staff telephones HHA, Nursing Homes / Clarify orders / etc []  - 0 Routine Transfer to another Facility (non-emergent condition) []  - 0 Routine Hospital Admission (non-emergent condition) X- 1 15 New Admissions / / Ordering NPWT Apligraf, etc. , []  - 0 Emergency Hospital Admission (emergent condition) PROCESS - Special Needs []  - 0 Pediatric / Minor Patient Management []  - 0 Isolation Patient Management []  - 0 Hearing / Language / Visual special needs []  - 0 Assessment of Community assistance (transportation, D/C planning, etc.) []  - 0 Additional assistance / Altered mentation []  - 0 Support Surface(s) Assessment (bed, cushion, seat, etc.) INTERVENTIONS - Miscellaneous []  - 0 External ear exam []  - 0 Patient Transfer (multiple staff / / Similar devices) []  - 0 Simple Staple  / Suture removal (25 or less) []  - 0 Complex Staple / Suture removal (26 or more) []  - 0 Hypo/Hyperglycemic Management (do not check if billed separately) X- 1 15 Ankle / Brachial Index (ABI) - do not check if billed separately Has the patient been seen at the hospital within  the last three years: Yes Total Score: 55 Level Of Care: New/Established - Level 2 Electronic Signature(s) Signed: 01/26/2020 6:04:59 PM By: Yevonne PaxEpps, Carrie RN Entered By: Yevonne PaxEpps, Carrie on 01/25/2020 16:17:45 -------------------------------------------------------------------------------- Encounter Discharge Information Details Patient Name: Date of Service: [place] 01/25/2020 2:45 PM Medical Record Number: [id number] Patient Account Number: [id number] Date of Birth/Sex: Treating RN: [birth year] (64 y.o. Katherina RightM) Dwiggins, Shannon Primary Care Cosme Jacob: Cynda FamiliaMeyers, Stephen C Other Clinician: Referring Rachael Zapanta: Treating Corrin Sieling/Extender: Willia Crazeobson, Michael Meyers, Stephen C Weeks in Treatment: 0 Encounter Discharge Information Items Post Procedure Vitals Discharge Condition: Stable Temperature (F): 98.4 Ambulatory Status: Ambulatory Pulse (bpm): 74 Discharge Destination: Home Respiratory Rate (breaths/min): 18 Transportation: Private Auto Blood Pressure (mmHg): 164/95 Accompanied By: self Schedule Follow-up Appointment: Yes Clinical Summary of Care: Patient Declined Electronic Signature(s) Signed: 01/28/2020 5:02:39 PM By: Cherylin Mylarwiggins, Shannon Entered By: Cherylin Mylarwiggins, Shannon on 01/25/2020 16:58:56 -------------------------------------------------------------------------------- Lower Extremity Assessment Details Patient Name: Date of Service: [place] 01/25/2020 2:45 PM Medical Record Number: [id number] Patient Account Number: [id number] Date of Birth/Sex: Treating RN: [birth year] (64 y.o. Damaris SchoonerM) Boehlein, Linda Primary Care Shanekia Latella: Cynda FamiliaMeyers, Stephen C Other Clinician: Referring Cloy Cozzens: Treating  Kyshawn Teal/Extender: Willia Crazeobson, Michael Meyers, Stephen C Weeks in Treatment: 0 Edema Assessment Assessed: Kyra Searles[Left: No] [Right: No] Edema: [Left: N] [Right: o] Calf Left: Right: Point of Measurement: cm From Medial Instep cm 40 cm Ankle Left: Right: Point of Measurement: cm From Medial Instep cm 23.3 cm Vascular Assessment Pulses: Dorsalis Pedis Palpable: [Right:Yes] Blood Pressure: Brachial: [Right:164] Dorsalis Pedis: 192 Ankle: Posterior Tibial: 184 Ankle Brachial Index: [Right:1.17] Electronic Signature(s) Signed: 01/25/2020 5:47:23 PM By: Zenaida DeedBoehlein, Linda RN, BSN Entered By: Zenaida DeedBoehlein, Linda on 01/25/2020 15:32:33 -------------------------------------------------------------------------------- Multi Wound Chart Details Patient Name: [place]: [name] 01/25/2020 2:45 PM Medical Record Number: [id number] Patient Account Number: [id number] Date of Birth/Sex: Treating RN: [birth year] (64 y.o. Melonie [place]) Epps, Carrie Primary Care Chandlar Staebell: Cynda FamiliaMeyers, Stephen C Other Clinician: Referring Jabbar Palmero: Treating Tallie Dodds/Extender: Willia Crazeobson, Michael Meyers, Stephen C Weeks in Treatment: 0 Vital Signs Height(in): 70 Capillary Blood Glucose(mg/dl): 086180 Weight(lbs): 578222 Pulse(bpm): 74 Body Mass Index(BMI): 32 Blood Pressure(mmHg): 164/95 Temperature(F): 98.4 Respiratory Rate(breaths/min): 18 Photos: [4:No Photos Right T Great oe] [N/A:N/A N/A] Wound Location: [4:Blister] [N/A:N/A] Wounding Event: [4:Diabetic Wound/Ulcer of the Lower] [N/A:N/A] Primary Etiology: [4:Extremity Cataracts, Hypertension, Type II] [N/A:N/A] Comorbid History: [4:Diabetes, Neuropathy, Confinement Anxiety 12/24/2019] [N/A:N/A] Date Acquired: [4:0] [N/A:N/A] Weeks of Treatment: [4:Open] [N/A:N/A] Wound Status: [4:0.6x0.3x0.5] [N/A:N/A] Measurements L x W x D (cm) [4:0.141] [N/A:N/A] A (cm) : rea [4:0.071] [N/A:N/A] Volume (cm) : [4:Grade 1] [N/A:N/A] Classification: [4:Small] [N/A:N/A] Exudate A mount:  [4:Serosanguineous] [N/A:N/A] Exudate Type: [4:red, brown] [N/A:N/A] Exudate Color: [4:Thickened] [N/A:N/A] Wound Margin: [4:Large (67-100%)] [N/A:N/A] Granulation A mount: [4:Red] [N/A:N/A] Granulation Quality: [4:None Present (0%)] [N/A:N/A] Necrotic A mount: [4:Fat Layer (Subcutaneous Tissue)] [N/A:N/A] Exposed Structures: [4:Exposed: Yes Fascia: No Tendon: No Muscle: No Joint: No Bone: No Small (1-33%)] [N/A:N/A] Epithelialization: [4:Debridement - Excisional] [N/A:N/A] Debridement: Pre-procedure Verification/Time Out 16:05 [N/A:N/A] Taken: [4:Lidocaine 5% topical ointment] [N/A:N/A] Pain Control: [4:Callus, Subcutaneous, Slough] [N/A:N/A] Tissue Debrided: [4:Skin/Subcutaneous Tissue] [N/A:N/A] Level: [4:0.18] [N/A:N/A] Debridement A (sq cm): [4:rea Curette] [N/A:N/A] Instrument: [4:Moderate] [N/A:N/A] Bleeding: [4:Silver Nitrate] [N/A:N/A] Hemostasis A chieved: [4:0] [N/A:N/A] Procedural Pain: [4:0] [N/A:N/A] Post Procedural Pain: [4:Procedure was tolerated well] [N/A:N/A] Debridement Treatment Response: [4:0.6x0.3x0.5] [N/A:N/A] Post Debridement Measurements L x W x D (cm) [4:0.071] [N/A:N/A] Post Debridement Volume: (cm) [4:Debridement] [N/A:N/A] Treatment Notes Electronic Signature(s) Signed: 01/26/2020 3:52:22 AM By: Baltazar Najjarobson, Michael MD Signed: 01/26/2020 6:04:59 PM By: Yevonne PaxEpps, Carrie RN Entered  By: Baltazar Najjar on 01/25/2020 16:17:39 -------------------------------------------------------------------------------- Multi-Disciplinary Care Plan Details Patient Name: Date of Service: [place], [place] 01/25/2020 2:45 PM Medical Record Number: [id number] Patient Account Number: [id number] Date of Birth/Sex: Treating RN: Feb 21, 1956 (64 y.o. Judie Petit) [name] Primary Care Ladaja Yusupov: Cynda Familia Other Clinician: Referring Chantea Surace: Treating Dilan Fullenwider/Extender: Willia Craze in Treatment: 0 Active Inactive Wound/Skin Impairment Nursing  Diagnoses: Knowledge deficit related to ulceration/compromised skin integrity Goals: Patient/caregiver will verbalize understanding of skin care regimen Date Initiated: 01/25/2020 Target Resolution Date: 02/25/2020 Goal Status: Active Ulcer/skin breakdown will have a volume reduction of 30% by week 4 Date Initiated: 01/25/2020 Target Resolution Date: 02/25/2020 Goal Status: Active Interventions: Assess patient/caregiver ability to obtain necessary supplies Assess patient/caregiver ability to perform ulcer/skin care regimen upon admission and as needed Assess ulceration(s) every visit Notes: Electronic Signature(s) Signed: 01/26/2020 6:04:59 PM By: [name] RN Entered By: [name] on 01/25/2020 16:06:16 -------------------------------------------------------------------------------- Pain Assessment Details Patient Name: Date of Service: RAMIZ, [place] 01/25/2020 2:45 PM Medical Record Number: [id number] Patient Account Number: [id number] Date of Birth/Sex: Treating RN: 09/20/1955 (64 y.o. [name] Primary Care Akari Crysler: Cynda Familia Other Clinician: Referring Janmarie Smoot: Treating Ladale Sherburn/Extender: Willia Craze in Treatment: 0 Active Problems Location of Pain Severity and Description of Pain Patient Has Paino No Site Locations Rate the pain. Rate the pain. Current Pain Level: 0 Pain Management and Medication Current Pain Management: Electronic Signature(s) Signed: 01/25/2020 5:47:23 PM By: Zenaida Deed RN, BSN Entered By: Zenaida Deed on 01/25/2020 15:27:09 -------------------------------------------------------------------------------- Patient/Caregiver Education Details Patient Name: Date of Service: Lorrene Reid RK 7/27/2021andnbsp2:45 PM Medical Record Number: [id number] Patient Account Number: [id number] Date of Birth/Gender: Treating RN: [birth year] (64 y.o. Melonie [place] Primary Care Physician: Cynda Familia Other Clinician: Referring Physician: Treating Physician/Extender: Willia Craze in Treatment: 0 Education Assessment Education Provided To: Patient Education Topics Provided Wound/Skin Impairment: Methods: Explain/Verbal Responses: State content correctly Electronic Signature(s) Signed: 01/26/2020 6:04:59 PM By: [name] RN Entered By: [name] on 01/25/2020 16:06:26 -------------------------------------------------------------------------------- Wound Assessment Details Patient Name: Date of Service: SEUNG, [place] 01/25/2020 2:45 PM Medical Record Number: [id number] Patient Account Number: [id number] Date of Birth/Sex: Treating RN: 04-Jun-1956 (64 y.o. [name] Primary Care Jonmichael Beadnell: Cynda Familia Other Clinician: Referring Chilton Sallade: Treating Abryana Lykens/Extender: Willia Craze in Treatment: 0 Wound Status Wound Number: 4 Primary Diabetic Wound/Ulcer of the Lower Extremity Etiology: Wound Location: Right T Great oe Wound Status: Open Wounding Event: Blister Comorbid Cataracts, Hypertension, Type II Diabetes, Neuropathy, Date Acquired: 12/24/2019 History: Confinement Anxiety Weeks Of Treatment: 0 Clustered Wound: No Photos Photo Uploaded By: Benjaman Kindler on 01/26/2020 11:11:01 Wound Measurements Length: (cm) 0.6 Width: (cm) 0.3 Depth: (cm) 0.5 Area: (cm) 0.141 Volume: (cm) 0.071 % Reduction in Area: % Reduction in Volume: Epithelialization: Small (1-33%) Tunneling: No Undermining: No Wound Description Classification: Grade 1 Wound Margin: Thickened Exudate Amount: Small Exudate Type: Serosanguineous Exudate Color: red, brown Foul Odor After Cleansing: No Slough/Fibrino No Wound Bed Granulation Amount: Large (67-100%) Exposed Structure Granulation Quality: Red Fascia Exposed: No Necrotic Amount: None Present (0%) Fat Layer (Subcutaneous Tissue) Exposed: Yes Tendon Exposed:  No Muscle Exposed: No Joint Exposed: No Bone Exposed: No Treatment Notes Wound #4 (Right Toe Great) 1. Cleanse With Wound Cleanser 2. Periwound Care Skin Prep 3. Primary Dressing Applied Col[employer] 4. Secondary Dressing Dry Gauze Roll Gauze 5. Secured With Tape 7. Footwear/Offloading device applied Felt/Foam Surgical shoe  Electronic Signature(s) Signed: 01/25/2020 5:47:23 PM By: Zenaida Deed RN, BSN Entered By: Zenaida Deed on 01/25/2020 15:26:46 -------------------------------------------------------------------------------- Vitals Details Patient Name: Date of Service: [name], [name] 01/25/2020 2:45 PM Medical Record Number: [id number] Patient Account Number: [id number] Date of Birth/Sex: Treating RN: June 04, 1956 (64 y.o. [name] Primary Care Christy Ehrsam: Cynda Familia Other Clinician: Referring Phoenix Dresser: Treating Kenden Brandt/Extender: Willia Craze in Treatment: 0 Vital Signs Time Taken: 15:05 Temperature (F): 98.4 Height (in): 70 Pulse (bpm): 74 Source: Stated Respiratory Rate (breaths/min): 18 Weight (lbs): 222 Blood Pressure (mmHg): 164/95 Source: Stated Capillary Blood Glucose (mg/dl): 655 Body Mass Index (BMI): 31.9 Reference Range: 80 - 120 mg / dl Notes glucose per pt report a few days ago Electronic Signature(s) Signed: 01/25/2020 5:47:23 PM By: Zenaida Deed RN, BSN Entered By: Zenaida Deed on 01/25/2020 15:07:18

## 2020-02-08 ENCOUNTER — Encounter (HOSPITAL_BASED_OUTPATIENT_CLINIC_OR_DEPARTMENT_OTHER): Payer: 59 | Admitting: Internal Medicine

## 2020-02-09 ENCOUNTER — Encounter (HOSPITAL_BASED_OUTPATIENT_CLINIC_OR_DEPARTMENT_OTHER): Payer: 59 | Admitting: Physician Assistant

## 2020-02-11 ENCOUNTER — Encounter (HOSPITAL_BASED_OUTPATIENT_CLINIC_OR_DEPARTMENT_OTHER): Payer: 59 | Attending: Internal Medicine | Admitting: Internal Medicine

## 2020-02-11 DIAGNOSIS — E11621 Type 2 diabetes mellitus with foot ulcer: Secondary | ICD-10-CM | POA: Diagnosis present

## 2020-02-11 DIAGNOSIS — E1142 Type 2 diabetes mellitus with diabetic polyneuropathy: Secondary | ICD-10-CM | POA: Diagnosis not present

## 2020-02-11 DIAGNOSIS — I10 Essential (primary) hypertension: Secondary | ICD-10-CM | POA: Insufficient documentation

## 2020-02-11 DIAGNOSIS — L97512 Non-pressure chronic ulcer of other part of right foot with fat layer exposed: Secondary | ICD-10-CM | POA: Diagnosis not present

## 2020-02-11 DIAGNOSIS — E785 Hyperlipidemia, unspecified: Secondary | ICD-10-CM | POA: Insufficient documentation

## 2020-02-11 NOTE — Progress Notes (Signed)
[name], [name] ([id number]) Visit Report for 02/11/2020 Arrival Information Details Patient Name: Date of Service: [name], [name] RK 02/11/2020 8:00 A M Medical Record Number: [id number] Patient Account Number: [id number] Date of Birth/Sex: Treating RN: 04/29/1956 (64 y.o. [name] Primary Care [name]: Cynda Familia Other Clinician: Referring [name]: Treating [name]/Extender: Windy Canny in Treatment: 2 Visit Information History Since Last Visit Added or deleted any medications: No Patient Arrived: Ambulatory Any new allergies or adverse reactions: No Arrival Time: 08:18 Had a fall or experienced change in No Accompanied By: self activities of daily living that may affect Transfer Assistance: None risk of falls: Patient Identification Verified: Yes Signs or symptoms of abuse/neglect since last visito No Secondary Verification Process Completed: Yes Hospitalized since last visit: No Patient Requires Transmission-Based Precautions: No Implantable device outside of the clinic excluding No Patient Has Alerts: No cellular tissue based products placed in the center since last visit: Pain Present Now: No Electronic Signature(s) Signed: 02/11/2020 4:53:28 PM By: Cherylin Mylar Entered By: Cherylin Mylar on 02/11/2020 08:19:02 -------------------------------------------------------------------------------- Encounter Discharge Information Details Patient Name: Date of Service: Todd Price, [name] 02/11/2020 8:00 A M Medical Record Number: [id number] Patient Account Number: [id number] Date of Birth/Sex: Treating RN: 1955-09-04 (64 y.o. [name] Primary Care Tiajuana Leppanen: Cynda Familia Other Clinician: Referring Kaianna Dolezal: Treating [name]/Extender: Windy Canny in Treatment: 2 Encounter Discharge Information Items Post Procedure Vitals Discharge Condition: Stable Temperature (F): 98.3 Ambulatory Status:  Ambulatory Pulse (bpm): 67 Discharge Destination: Home Respiratory Rate (breaths/min): 18 Transportation: Private Auto Blood Pressure (mmHg): 200/103 Accompanied By: self Schedule Follow-up Appointment: Yes Clinical Summary of Care: Patient Declined Electronic Signature(s) Signed: 02/11/2020 5:26:58 PM By: Todd Deed RN, BSN Entered By: [name] on 02/11/2020 08:43:05 -------------------------------------------------------------------------------- Lower Extremity Assessment Details Patient Name: Date of Service: [name], [name] RK 02/11/2020 8:00 A M Medical Record Number: [id number] Patient Account Number: [id number] Date of Birth/Sex: Treating RN: 1956-06-30 (64 y.o. [name] Primary Care Demetria Lightsey: Cynda Familia Other Clinician: Referring Aaira Oestreicher: Treating [name]/Extender: Ola Spurr Weeks in Treatment: 2 Edema Assessment Assessed: [Left: No] [Collier: No] Edema: [Left: N] [Collier: o] Calf Left: Collier: Point of Measurement: cm From Medial Instep cm 38 cm Ankle Left: Collier: Point of Measurement: cm From Medial Instep cm 22 cm Vascular Assessment Pulses: Dorsalis Pedis Palpable: [Collier:Yes] Electronic Signature(s) Signed: 02/11/2020 4:53:28 PM By: Cherylin Mylar Entered By: Cherylin Mylar on 02/11/2020 08:21:57 -------------------------------------------------------------------------------- Multi-Disciplinary Care Plan Details Patient Name: Date of Service: Todd Price, [name] 02/11/2020 8:00 A M Medical Record Number: [id number] Patient Account Number: [id number] Date of Birth/Sex: Treating RN: 21-Sep-1955 (64 y.o. [name] Primary Care [name]: Cynda Familia Other Clinician: Referring Kenzel Ruesch: Treating [name]/Extender: Windy Canny in Treatment: 2 Active Inactive Wound/Skin Impairment Nursing Diagnoses: Knowledge deficit related to ulceration/compromised skin  integrity Goals: Patient/caregiver will verbalize understanding of skin care regimen Date Initiated: 01/25/2020 Target Resolution Date: 02/25/2020 Goal Status: Active Ulcer/skin breakdown will have a volume reduction of 30% by week 4 Date Initiated: 01/25/2020 Target Resolution Date: 02/25/2020 Goal Status: Active Interventions: Assess patient/caregiver ability to obtain necessary supplies Assess patient/caregiver ability to perform ulcer/skin care regimen upon admission and as needed Assess ulceration(s) every visit Notes: Electronic Signature(s) Signed: 02/11/2020 4:53:28 PM By: Cherylin Mylar Entered By: Cherylin Mylar on 02/11/2020 08:24:11 -------------------------------------------------------------------------------- Pain Assessment Details Patient Name: Date of Service: Todd Price, [name] 02/11/2020 8:00 A M Medical Record Number: [id number] Patient Account Number: [id number]  Date of Birth/Sex: Treating RN: 11/18/1955 (64 y.o. [name] Primary Care [name]: Cynda Familia Other Clinician: Referring Kelci Petrella: Treating [name]/Extender: Windy Canny in Treatment: 2 Active Problems Location of Pain Severity and Description of Pain Patient Has Paino No Site Locations Pain Management and Medication Current Pain Management: Electronic Signature(s) Signed: 02/11/2020 4:53:28 PM By: Cherylin Mylar Entered By: Cherylin Mylar on 02/11/2020 08:21:20 -------------------------------------------------------------------------------- Patient/Caregiver Education Details Patient Name: Date of Service: Spanbauer, [name] 8/13/2021andnbsp8:00 A M Medical Record Number: [id number] Patient Account Number: [id number] Date of Birth/Gender: Treating RN: December 26, 1955 (64 y.o. [name] Primary Care Physician: Cynda Familia Other Clinician: Referring Physician: Treating Physician/Extender: Windy Canny  in Treatment: 2 Education Assessment Education Provided To: Patient Education Topics Provided Wound/Skin Impairment: Handouts: Caring for Your Ulcer Methods: Explain/Verbal Responses: State content correctly Electronic Signature(s) Signed: 02/11/2020 4:53:28 PM By: Cherylin Mylar Entered By: Cherylin Mylar on 02/11/2020 08:24:22 -------------------------------------------------------------------------------- Wound Assessment Details Patient Name: Date of Service: Todd Price, [name] 02/11/2020 8:00 A M Medical Record Number: [id number] Patient Account Number: [id number] Date of Birth/Sex: Treating RN: September 29, 1955 (64 y.o. [name] Primary Care Shalonda Sachse: Cynda Familia Other Clinician: Referring Cherre Kothari: Treating Paola Aleshire/Extender: Ola Spurr Weeks in Treatment: 2 Wound Status Wound Number: 4 Primary Diabetic Wound/Ulcer of the Lower Extremity Etiology: Wound Location: Collier T Great oe Wound Open Wounding Event: Blister Status: Date Acquired: 12/24/2019 Comorbid Cataracts, Hypertension, Type II Diabetes, Neuropathy, Weeks Of Treatment: 2 History: Confinement Anxiety Clustered Wound: No Wound Measurements Length: (cm) 0.6 Width: (cm) 0.4 Depth: (cm) 0.4 Area: (cm) 0.188 Volume: (cm) 0.075 % Reduction in Area: -33.3% % Reduction in Volume: -5.6% Epithelialization: Small (1-33%) Tunneling: No Undermining: Yes Starting Position (o'clock): 12 Ending Position (o'clock): 12 Maximum Distance: (cm) 0.5 Wound Description Classification: Grade 2 Wound Margin: Thickened Exudate Amount: Small Exudate Type: Serosanguineous Exudate Color: red, brown Foul Odor After Cleansing: No Slough/Fibrino No Wound Bed Granulation Amount: Large (67-100%) Exposed Structure Granulation Quality: Red Fascia Exposed: No Necrotic Amount: None Present (0%) Fat Layer (Subcutaneous Tissue) Exposed: Yes Tendon Exposed: No Muscle Exposed: No Joint  Exposed: No Bone Exposed: No Treatment Notes Wound #4 (Collier Toe Great) 3. Primary Dressing Applied Collegen AG 4. Secondary Dressing Dry Gauze Roll Gauze 7. Footwear/Offloading device applied Felt/Foam Electronic Signature(s) Signed: 02/11/2020 4:53:28 PM By: Cherylin Mylar Entered By: Cherylin Mylar on 02/11/2020 08:23:37 -------------------------------------------------------------------------------- Vitals Details Patient Name: Date of Service: Todd Price, [name] 02/11/2020 8:00 A M Medical Record Number: [id number] Patient Account Number: [id number] Date of Birth/Sex: Treating RN: 1956-03-27 (64 y.o. [name] Primary Care Annelie Boak: Cynda Familia Other Clinician: Referring Zanyia Silbaugh: Treating Karelyn Brisby/Extender: Windy Canny in Treatment: 2 Vital Signs Time Taken: 08:19 Temperature (F): 98.3 Height (in): 70 Pulse (bpm): 67 Weight (lbs): 222 Respiratory Rate (breaths/min): 18 Body Mass Index (BMI): 31.9 Blood Pressure (mmHg): 200/103 Reference Range: 80 - 120 mg / dl Electronic Signature(s) Signed: 02/11/2020 4:53:28 PM By: Cherylin Mylar Entered By: Cherylin Mylar on 02/11/2020 08:21:14

## 2020-02-11 NOTE — Progress Notes (Signed)
[name], [name] ([id number]) Visit Report for 02/11/2020 Debridement Details Patient Name: Date of Service: [name], [name] [name] 02/11/2020 8:00 A M Medical Record Number: [id number] Patient Account Number: [id number] Date of Birth/Sex: Treating RN: 1956/03/06 (64 y.o. Katherina Right Primary Care Provider: Cynda Familia Other Clinician: Referring Provider: Treating Provider/Extender: Windy Canny in Treatment: 2 Debridement Performed for Assessment: Wound #4 Right T Great oe Performed By: Physician Cassandria Anger, MD Debridement Type: Debridement Severity of Tissue Pre Debridement: Fat layer exposed Level of Consciousness (Pre-procedure): Awake and Alert Pre-procedure Verification/Time Out Yes - 08:29 Taken: Start Time: 08:29 Pain Control: Lidocaine 4% T opical Solution T Area Debrided (L x W): otal 0.6 (cm) x 0.4 (cm) = 0.24 (cm) Tissue and other material debrided: Viable, Non-Viable, Callus Level: Non-Viable Tissue Debridement Description: Selective/Open Wound Instrument: Curette Bleeding: Minimum Hemostasis Achieved: Pressure End Time: 08:30 Procedural Pain: 0 Post Procedural Pain: 0 Response to Treatment: Procedure was tolerated well Level of Consciousness (Post- Awake and Alert procedure): Post Debridement Measurements of Total Wound Length: (cm) 0.6 Width: (cm) 0.4 Depth: (cm) 0.4 Volume: (cm) 0.075 Character of Wound/Ulcer Post Debridement: Improved Severity of Tissue Post Debridement: Fat layer exposed Post Procedure Diagnosis Same as Pre-procedure Electronic Signature(s) Signed: 02/11/2020 9:39:52 AM By: Cassandria Anger MD, MBA Signed: 02/11/2020 4:53:28 PM By: Cherylin Mylar Entered By: Cherylin Mylar on 02/11/2020 08:31:36 -------------------------------------------------------------------------------- HPI Details Patient Name: Date of Service: Todd Price, [name] 02/11/2020 8:00 A M Medical Record Number: [id number] Patient  Account Number: [id number] Date of Birth/Sex: Treating RN: [birth year] (64 y.o. Katherina Right Primary Care Provider: Cynda Familia Other Clinician: Referring Provider: Treating Provider/Extender: Windy Canny in Treatment: 2 History of Present Illness HPI Description: 12/16/18 on evaluation today patient presents for initial evaluation our clinic as a self-referral due to a wound that is had on his right first toe for 6-8 weeks. He states his last hemoglobin A1c which was roughly 4 months ago was 11.0 he knows he has not been taking care of himself in this regard. He's actually been out of medications for two weeks both in regard to his diabetes as well as his high blood pressure. He states he is going to see those doctors in the next week. Subsequently he actually told me that being here in the clinic and seeing so many patients that did have applications in such has really got him thinking about the fact that he needs to take better care of himself in this regard. Fortunately there's no signs of active infection at this time which is good news. No fevers, chills, nausea, or vomiting noted at this time. He has been seen a local podiatrist for the past 6-8 weeks he was recommending for the patient that he soak in Epsom salts and come in on the patient tells me generally Tuesday and Thursday for debridement's and otherwise was told to leave the wound open to air just put a sock on and go about his business and that "it would scab up and be okay". With that being said the patient states when he realized things were not getting better and he really did not agree with the treatment plan he decided to get a second opinion here the wound care center. Apparently he's had x-rays although the patient's podiatrist has not sent [place] records at this point I think we may want to send them to the hospital for an x-ray just to have everything documented before we see about  what we  need to do from the standpoint of more aggressive treatment such as potentially casting all over this be his right foot that's gonna be somewhat difficult in my pinion. 12/23/18 on evaluation today patient actually appears to be doing okay in regard to his plantar foot ulcer. Unfortunately he was not able to go get the x-ray during the last visit here in the office. Subsequently he states that he actually forgot to go do that in the interim between last week and this week. That is something is gonna go do as soon as he leaves here today. We discussed again that I do think the total contact cast would be the appropriate way to go in the best way to get the news to heal. With that being said beginning the x-ray clear before we proceed with the cast he understands. 6/26; patient here for obligatory total contact cast change no complaints 12/30/18 on evaluation today patient actually appears to be doing excellent in regard to his plantar foot wound. This is looking tremendously better there's new callous buildup it's not as deep and overall show signs of excellent improvement. I'm very pleased in this regard. Overall I think that he should continue with the Current wound care measures including the total contact cast as I believe this is gonna be most helpful in getting this to heal very quickly. 01/06/19 on evaluation today patient actually appears to be doing quite well with regard to his toe ulcer. This is shown signs of good improvement in fact is measuring smaller by about half of what it was last week. Nonetheless I think this is excellent improvement is filled in an overall is making great progress. No fevers, chills, nausea, or vomiting noted at this time. 7/16; patient is here for review of a wound on the plantar aspect of his right great toe. Been using silver collagen under a total contact cast 01/20/19 on evaluation today patient actually presents for a slightly early follow-up due to the fact that  his cast has been rubbing on the right medial ankle region. Unfortunately he has a new wound at this location because of the rubbing of the cast. There does not appear to be signs of active infection which is good news. No fevers, chills, nausea, or vomiting noted at this time. Overall been pleased with how things have progressed in regard to his toe again today this appears to be doing well. 02/01/19-Patient returns at 12 days being out of the cast on account of going to the beach, he has been using Prisma to his wounds on his right medial ankle and right great toe plantar aspect. Both appear to be around the same dimensions. 02/08/19-Patient returns at 1 week after being in a cast both the wounds are better in fact the right great plantar wound is healed, right medial ankle wound looks better. We are using Prisma. Does not need to be in cast anymore 8/24; 2-week follow-up. Everything is healed here including the right great plantar toe and the cast injury on the medial ankle. He still has callus on the plantar right great toe. He has gone to [employer] I believe and has custom inserts at least. His wounds have closed over READMISSION 01/25/2020. This is a 64 year old man with poorly controlled type 2 diabetes with a recent hemoglobin A1c of 9.3. He also has diabetic neuropathy. We had him here extensively in 2020 again for a wound on the plantar aspect of the right great toe. Eventually we heal  this over and a total contact cast. He got custom inserts for his running shoes at [employer]. He has been using them reliably. He states that roughly 3 to 4 weeks ago he developed a thick callus on the bottom of the toe it eventually separated or split and underneath was an open wound. He saw his primary doctor who would and put him on doxycycline for 10 days. Past medical history type 2 diabetes with peripheral neuropathy, hyperlipidemia and hypertension. ABI in our clinic was 1.17 on the right 02/11/20-Patient  back at 2 weeks, right great toe plantar wound is about the same with slight callus build up. Electronic Signature(s) Signed: 02/11/2020 8:33:54 AM By: Cassandria Anger MD, MBA Entered By: Cassandria Anger on 02/11/2020 08:33:54 -------------------------------------------------------------------------------- Physical Exam Details Patient Name: Date of Service: Todd Price, [name] 02/11/2020 8:00 A M Medical Record Number: [id number] Patient Account Number: [id number] Date of Birth/Sex: Treating RN: 1956-05-30 (64 y.o. Katherina Right Primary Care Provider: Cynda Familia Other Clinician: Referring Provider: Treating Provider/Extender: Windy Canny in Treatment: 2 Constitutional alert and oriented x 3. sitting or standing blood pressure is within target range for patient.. supine blood pressure is within target range for patient.. pulse regular and within target range for patient.Marland Kitchen respirations regular, non-labored and within target range for patient.Marland Kitchen temperature within target range for patient.. . . Well- nourished and well-hydrated in no acute distress. Notes right great toe wound on plantar surface with minimal callus rim was debrided with curette Electronic Signature(s) Signed: 02/11/2020 8:34:44 AM By: Cassandria Anger MD, MBA Entered By: Cassandria Anger on 02/11/2020 08:34:43 -------------------------------------------------------------------------------- Physician Orders Details Patient Name: Date of Service: Todd Price, [name] 02/11/2020 8:00 A M Medical Record Number: [id number] Patient Account Number: [id number] Date of Birth/Sex: Treating RN: [birth year] (64 y.o. Katherina Right Primary Care Provider: Cynda Familia Other Clinician: Referring Provider: Treating Provider/Extender: Windy Canny in Treatment: 2 Verbal / Phone Orders: No Diagnosis Coding ICD-10 Coding Code Description E11.621 Type 2 diabetes mellitus  with foot ulcer E11.42 Type 2 diabetes mellitus with diabetic polyneuropathy L97.512 Non-pressure chronic ulcer of other part of right foot with fat layer exposed Follow-up Appointments Return Appointment in 2 weeks. Dressing Change Frequency Change Dressing every other day. Wound Cleansing May shower and wash wound with soap and water. Primary Wound Dressing Silver Collagen - moisten with hydrogel Secondary Dressing Wound #4 Right T Great oe Dry Gauze - secure with tape Other: - felt Off-Loading Wound #4 Right T Great oe Other: - surgical shoe Electronic Signature(s) Signed: 02/11/2020 9:39:52 AM By: Cassandria Anger MD, MBA Signed: 02/11/2020 4:53:28 PM By: Cherylin Mylar Entered By: Cherylin Mylar on 02/11/2020 08:24:03 -------------------------------------------------------------------------------- Problem List Details Patient Name: Date of Service: Todd Price, [name] 02/11/2020 8:00 A M Medical Record Number: [id number] Patient Account Number: [id number] Date of Birth/Sex: Treating RN: [birth year] (64 y.o. Katherina Right Primary Care Provider: Cynda Familia Other Clinician: Referring Provider: Treating Provider/Extender: Windy Canny in Treatment: 2 Active Problems ICD-10 Encounter Code Description Active Date MDM Diagnosis E11.621 Type 2 diabetes mellitus with foot ulcer 01/25/2020 No Yes E11.42 Type 2 diabetes mellitus with diabetic polyneuropathy 01/25/2020 No Yes L97.512 Non-pressure chronic ulcer of other part of right foot with fat layer exposed 01/25/2020 No Yes Inactive Problems Resolved Problems Electronic Signature(s) Signed: 02/11/2020 9:39:52 AM By: Cassandria Anger MD, MBA Signed: 02/11/2020 4:53:28 PM By: Cherylin Mylar Entered By: Cherylin Mylar on 02/11/2020 08:23:43 -------------------------------------------------------------------------------- Progress Note  Details Patient Name: Date of Service: Todd [place], [place]  [name] 02/11/2020 8:00 A M Medical Record Number: [id number] Patient Account Number: [id number] Date of Birth/Sex: Treating RN: 07-Nov-1955 (64 y.o. Katherina RightM) Dwiggins, Shannon Primary Care Provider: Cynda FamiliaMeyers, Stephen C Other Clinician: Referring Provider: Treating Provider/Extender: Windy CannyMadduri, Nyelle Wolfson Meyers, Stephen C Weeks in Treatment: 2 Subjective History of Present Illness (HPI) 12/16/18 on evaluation today patient presents for initial evaluation our clinic as a self-referral due to a wound that is had on his right first toe for 6-8 weeks. He states his last hemoglobin A1c which was roughly 4 months ago was 11.0 he knows he has not been taking care of himself in this regard. He's actually been out of medications for two weeks both in regard to his diabetes as well as his high blood pressure. He states he is going to see those doctors in the next week. Subsequently he actually told me that being here in the clinic and seeing so many patients that did have applications in such has really got him thinking about the fact that he needs to take better care of himself in this regard. Fortunately there's no signs of active infection at this time which is good news. No fevers, chills, nausea, or vomiting noted at this time. He has been seen a local podiatrist for the past 6-8 weeks he was recommending for the patient that he soak in Epsom salts and come in on the patient tells me generally Tuesday and Thursday for debridement's and otherwise was told to leave the wound open to air just put a sock on and go about his business and that "it would scab up and be okay". With that being said the patient states when he realized things were not getting better and he really did not agree with the treatment plan he decided to get a second opinion here the wound care center. Apparently he's had x-rays although the patient's podiatrist has not sent us records at this point I think we may want to send them to the hospital for an  x-ray just to have everything documented before we see about what we need to do from the standpoint of more aggressive treatment such as potentially casting all over this be his right foot that's gonna be somewhat difficult in my pinion. 12/23/18 on evaluation today patient actually appears to be doing okay in regard to his plantar foot ulcer. Unfortunately he was not able to go get the x-ray during the last visit here in the office. Subsequently he states that he actually forgot to go do that in the interim between last week and this week. That is something is gonna go do as soon as he leaves here today. We discussed again that I do think the total contact cast would be the appropriate way to go in the best way to get the news to heal. With that being said beginning the x-ray clear before we proceed with the cast he understands. 6/26; patient here for obligatory total contact cast change no complaints 12/30/18 on evaluation today patient actually appears to be doing excellent in regard to his plantar foot wound. This is looking tremendously better there's new callous buildup it's not as deep and overall show signs of excellent improvement. I'm very pleased in this regard. Overall I think that he should continue with the Current wound care measures including the total contact cast as I believe this is gonna be most helpful in getting this to heal very quickly. 01/06/19 on evaluation today  patient actually appears to be doing quite well with regard to his toe ulcer. This is shown signs of good improvement in fact is measuring smaller by about half of what it was last week. Nonetheless I think this is excellent improvement is filled in an overall is making great progress. No fevers, chills, nausea, or vomiting noted at this time. 7/16; patient is here for review of a wound on the plantar aspect of his right great toe. Been using silver collagen under a total contact cast 01/20/19 on evaluation today patient  actually presents for a slightly early follow-up due to the fact that his cast has been rubbing on the right medial ankle region. Unfortunately he has a new wound at this location because of the rubbing of the cast. There does not appear to be signs of active infection which is good news. No fevers, chills, nausea, or vomiting noted at this time. Overall been pleased with how things have progressed in regard to his toe again today this appears to be doing well. 02/01/19-Patient returns at 12 days being out of the cast on account of going to the beach, he has been using Prisma to his wounds on his right medial ankle and right great toe plantar aspect. Both appear to be around the same dimensions. 02/08/19-Patient returns at 1 week after being in a cast both the wounds are better in fact the right great plantar wound is healed, right medial ankle wound looks better. We are using Prisma. Does not need to be in cast anymore 8/24; 2-week follow-up. Everything is healed here including the right great plantar toe and the cast injury on the medial ankle. He still has callus on the plantar right great toe. He has gone to [employer] I believe and has custom inserts at least. His wounds have closed over READMISSION 01/25/2020. This is a 64 year old man with poorly controlled type 2 diabetes with a recent hemoglobin A1c of 9.3. He also has diabetic neuropathy. We had him here extensively in 2020 again for a wound on the plantar aspect of the right great toe. Eventually we heal this over and a total contact cast. He got custom inserts for his running shoes at [employer]. He has been using them reliably. He states that roughly 3 to 4 weeks ago he developed a thick callus on the bottom of the toe it eventually separated or split and underneath was an open wound. He saw his primary doctor who would and put him on doxycycline for 10 days. Past medical history type 2 diabetes with peripheral neuropathy, hyperlipidemia and  hypertension. ABI in our clinic was 1.17 on the right 02/11/20-Patient back at 2 weeks, right great toe plantar wound is about the same with slight callus build up. Objective Constitutional alert and oriented x 3. sitting or standing blood pressure is within target range for patient.. supine blood pressure is within target range for patient.. pulse regular and within target range for patient.Marland Kitchen respirations regular, non-labored and within target range for patient.Marland Kitchen temperature within target range for patient.. Well- nourished and well-hydrated in no acute distress. Vitals Time Taken: 8:19 AM, Height: 70 in, Weight: 222 lbs, BMI: 31.9, Temperature: 98.3 F, Pulse: 67 bpm, Respiratory Rate: 18 breaths/min, Blood Pressure: 200/103 mmHg. General Notes: right great toe wound on plantar surface with minimal callus rim was debrided with curette Integumentary (Hair, Skin) Wound #4 status is Open. Original cause of wound was Blister. The wound is located on the Right T Great. The wound measures 0.6cm  length x 0.4cm width oe x 0.4cm depth; 0.188cm^2 area and 0.075cm^3 volume. There is Fat Layer (Subcutaneous Tissue) Exposed exposed. There is no tunneling noted, however, there is undermining starting at 12:00 and ending at 12:00 with a maximum distance of 0.5cm. There is a small amount of serosanguineous drainage noted. The wound margin is thickened. There is large (67-100%) red granulation within the wound bed. There is no necrotic tissue within the wound bed. Assessment Active Problems ICD-10 Type 2 diabetes mellitus with foot ulcer Type 2 diabetes mellitus with diabetic polyneuropathy Non-pressure chronic ulcer of other part of right foot with fat layer exposed Procedures Wound #4 Pre-procedure diagnosis of Wound #4 is a Diabetic Wound/Ulcer of the Lower Extremity located on the Right T Great .Severity of Tissue Pre Debridement is: oe Fat layer exposed. There was a Selective/Open Wound Non-Viable  Tissue Debridement with a total area of 0.24 sq cm performed by Cassandria Anger, MD. With the following instrument(s): Curette to remove Viable and Non-Viable tissue/material. Material removed includes Callus after achieving pain control using Lidocaine 4% T opical Solution. No specimens were taken. A time out was conducted at 08:29, prior to the start of the procedure. A Minimum amount of bleeding was controlled with Pressure. The procedure was tolerated well with a pain level of 0 throughout and a pain level of 0 following the procedure. Post Debridement Measurements: 0.6cm length x 0.4cm width x 0.4cm depth; 0.075cm^3 volume. Character of Wound/Ulcer Post Debridement is improved. Severity of Tissue Post Debridement is: Fat layer exposed. Post procedure Diagnosis Wound #4: Same as Pre-Procedure Plan Follow-up Appointments: Return Appointment in 2 weeks. Dressing Change Frequency: Change Dressing every other day. Wound Cleansing: May shower and wash wound with soap and water. Primary Wound Dressing: Silver Collagen - moisten with hydrogel Secondary Dressing: Wound #4 Right T Great: oe Dry Gauze - secure with tape Other: - felt Off-Loading: Wound #4 Right T Great: oe Other: - surgical shoe -Continue Silver collagen to wound , every other day -Using surgical shoe to offload -Return to clinic in 2 weeks Electronic Signature(s) Signed: 02/11/2020 8:35:30 AM By: Cassandria Anger MD, MBA Entered By: Cassandria Anger on 02/11/2020 08:35:30 -------------------------------------------------------------------------------- SuperBill Details Patient Name: Date of Service: Todd Price, [name] 02/11/2020 Medical Record Number: [id number] Patient Account Number: [id number] Date of Birth/Sex: Treating RN: Jun 07, 1956 (64 y.o. Katherina Right Primary Care Provider: Cynda Familia Other Clinician: Referring Provider: Treating Provider/Extender: Windy Canny in  Treatment: 2 Diagnosis Coding ICD-10 Codes Code Description [phone] Type 2 diabetes mellitus with foot ulcer E11.42 Type 2 diabetes mellitus with diabetic polyneuropathy L97.512 Non-pressure chronic ulcer of other part of right foot with fat layer exposed Facility Procedures CPT4 Code: [id number] Description: [phone] - DEBRIDE WOUND 1ST 20 SQ CM OR < ICD-10 Diagnosis Description L97.512 Non-pressure chronic ulcer of other part of right foot with fat layer exposed Modifier: Quantity: 1 Physician Procedures : CPT4 Code Description Modifier [id number] - WC PHYS DEBR WO ANESTH 20 SQ CM ICD-10 Diagnosis Description L97.512 Non-pressure chronic ulcer of other part of right foot with fat layer exposed Quantity: 1 Electronic Signature(s) Signed: 02/11/2020 8:35:38 AM By: Cassandria Anger MD, MBA Entered By: Cassandria Anger on 02/11/2020 08:35:38

## 2020-02-15 NOTE — Progress Notes (Signed)
ANIK, WESCH ([id number]) Visit Report for 01/25/2020 Chief Complaint Document Details Patient Name: Date of Service: [place], [place] 01/25/2020 2:45 PM Medical Record Number: [id number] Patient Account Number: [id number] Date of Birth/Sex: Treating RN: March 01, 1956 (64 y.o. Judie Petit) [name] Primary Care Provider: Cynda Familia Other Clinician: Referring Provider: Treating Provider/Extender: Willia Craze in Treatment: 0 Information Obtained from: Patient Chief Complaint Right 1st T Ulcer oe 01/25/2020; patient returns to clinic for review of a wound on again on the right plantar first toe Electronic Signature(s) Signed: 01/26/2020 3:52:22 AM By: Baltazar Najjar MD Entered By: Baltazar Najjar on 01/25/2020 16:18:21 -------------------------------------------------------------------------------- Debridement Details Patient Name: Date of Service: Lorrene Reid RK 01/25/2020 2:45 PM Medical Record Number: [id number] Patient Account Number: [id number] Date of Birth/Sex: Treating RN: 1955-08-11 (64 y.o. Melonie [place] Primary Care Provider: Cynda Familia Other Clinician: Referring Provider: Treating Provider/Extender: Willia Craze in Treatment: 0 Debridement Performed for Assessment: Wound #4 Right T Great oe Performed By: Physician Maxwell Caul., MD Debridement Type: Debridement Severity of Tissue Pre Debridement: Fat layer exposed Level of Consciousness (Pre-procedure): Awake and Alert Pre-procedure Verification/Time Out Yes - 16:05 Taken: Start Time: 16:05 Pain Control: Lidocaine 5% topical ointment T Area Debrided (L x W): otal 0.6 (cm) x 0.3 (cm) = 0.18 (cm) Tissue and other material debrided: Viable, Non-Viable, Callus, Slough, Subcutaneous, Skin: Dermis , Skin: Epidermis, Slough Level: Skin/Subcutaneous Tissue Debridement Description: Excisional Instrument: Curette Bleeding: Moderate Hemostasis Achieved: Silver  Nitrate End Time: 16:08 Procedural Pain: 0 Post Procedural Pain: 0 Response to Treatment: Procedure was tolerated well Level of Consciousness (Post- Awake and Alert procedure): Post Debridement Measurements of Total Wound Length: (cm) 0.6 Width: (cm) 0.3 Depth: (cm) 0.5 Volume: (cm) 0.071 Character of Wound/Ulcer Post Debridement: Improved Severity of Tissue Post Debridement: Fat layer exposed Post Procedure Diagnosis Same as Pre-procedure Electronic Signature(s) Signed: 01/26/2020 3:52:22 AM By: Baltazar Najjar MD Signed: 01/26/2020 6:04:59 PM By: [name] RN Entered By: Baltazar Najjar on 01/25/2020 16:17:54 -------------------------------------------------------------------------------- HPI Details Patient Name: Date of Service: [name], [name] 01/25/2020 2:45 PM Medical Record Number: [id number] Patient Account Number: [id number] Date of Birth/Sex: Treating RN: May 30, 1956 (64 y.o. Melonie [place] Primary Care Provider: Cynda Familia Other Clinician: Referring Provider: Treating Provider/Extender: Willia Craze in Treatment: 0 History of Present Illness HPI Description: 12/16/18 on evaluation today patient presents for initial evaluation our clinic as a self-referral due to a wound that is had on his right first toe for 6-8 weeks. He states his last hemoglobin A1c which was roughly 4 months ago was 11.0 he knows he has not been taking care of himself in this regard. He's actually been out of medications for two weeks both in regard to his diabetes as well as his high blood pressure. He states he is going to see those doctors in the next week. Subsequently he actually told me that being here in the clinic and seeing so many patients that did have applications in such has really got him thinking about the fact that he needs to take better care of himself in this regard. Fortunately there's no signs of active infection at this time which is good news.  No fevers, chills, nausea, or vomiting noted at this time. He has been seen a local podiatrist for the past 6-8 weeks he was recommending for the patient that he soak in Epsom salts and come in on the patient tells me generally Tuesday and  Thursday for debridement's and otherwise was told to leave the wound open to air just put a sock on and go about his business and that "it would scab up and be okay". With that being said the patient states when he realized things were not getting better and he really did not agree with the treatment plan he decided to get a second opinion here the wound care center. Apparently he's had x-rays although the patient's podiatrist has not sent [place] records at this point I think we may want to send them to the hospital for an x-ray just to have everything documented before we see about what we need to do from the standpoint of more aggressive treatment such as potentially casting all over this be his right foot that's gonna be somewhat difficult in my pinion. 12/23/18 on evaluation today patient actually appears to be doing okay in regard to his plantar foot ulcer. Unfortunately he was not able to go get the x-ray during the last visit here in the office. Subsequently he states that he actually forgot to go do that in the interim between last week and this week. That is something is gonna go do as soon as he leaves here today. We discussed again that I do think the total contact cast would be the appropriate way to go in the best way to get the news to heal. With that being said beginning the x-ray clear before we proceed with the cast he understands. 6/26; patient here for obligatory total contact cast change no complaints 12/30/18 on evaluation today patient actually appears to be doing excellent in regard to his plantar foot wound. This is looking tremendously better there's new callous buildup it's not as deep and overall show signs of excellent improvement. I'm very  pleased in this regard. Overall I think that he should continue with the Current wound care measures including the total contact cast as I believe this is gonna be most helpful in getting this to heal very quickly. 01/06/19 on evaluation today patient actually appears to be doing quite well with regard to his toe ulcer. This is shown signs of good improvement in fact is measuring smaller by about half of what it was last week. Nonetheless I think this is excellent improvement is filled in an overall is making great progress. No fevers, chills, nausea, or vomiting noted at this time. 7/16; patient is here for review of a wound on the plantar aspect of his right great toe. Been using silver collagen under a total contact cast 01/20/19 on evaluation today patient actually presents for a slightly early follow-up due to the fact that his cast has been rubbing on the right medial ankle region. Unfortunately he has a new wound at this location because of the rubbing of the cast. There does not appear to be signs of active infection which is good news. No fevers, chills, nausea, or vomiting noted at this time. Overall been pleased with how things have progressed in regard to his toe again today this appears to be doing well. 02/01/19-Patient returns at 12 days being out of the cast on account of going to the beach, he has been using Prisma to his wounds on his right medial ankle and right great toe plantar aspect. Both appear to be around the same dimensions. 02/08/19-Patient returns at 1 week after being in a cast both the wounds are better in fact the right great plantar wound is healed, right medial ankle wound looks better. We  are using Prisma. Does not need to be in cast anymore 8/24; 2-week follow-up. Everything is healed here including the right great plantar toe and the cast injury on the medial ankle. He still has callus on the plantar right great toe. He has gone to [employer] I believe and has custom  inserts at least. His wounds have closed over READMISSION 01/25/2020. This is a 64 year old man with poorly controlled type 2 diabetes with a recent hemoglobin A1c of 9.3. He also has diabetic neuropathy. We had him here extensively in 2020 again for a wound on the plantar aspect of the right great toe. Eventually we heal this over and a total contact cast. He got custom inserts for his running shoes at [employer]. He has been using them reliably. He states that roughly 3 to 4 weeks ago he developed a thick callus on the bottom of the toe it eventually separated or split and underneath was an open wound. He saw his primary doctor who would and put him on doxycycline for 10 days. Past medical history type 2 diabetes with peripheral neuropathy, hyperlipidemia and hypertension. ABI in our clinic was 1.17 on the right Electronic Signature(s) Signed: 01/26/2020 3:52:22 AM By: Baltazar Najjar MD Entered By: Baltazar Najjar on 01/25/2020 16:20:30 -------------------------------------------------------------------------------- Physical Exam Details Patient Name: Date of Service: [name], [name] 01/25/2020 2:45 PM Medical Record Number: [id number] Patient Account Number: [id number] Date of Birth/Sex: Treating RN: [birth year] (64 y.o. Melonie [place] Primary Care Provider: Cynda Familia Other Clinician: Referring Provider: Treating Provider/Extender: Willia Craze in Treatment: 0 Constitutional Patient is hypertensive.. Pulse regular and within target range for patient.Marland Kitchen Respirations regular, non-labored and within target range.. Temperature is normal and within the target range for the patient.Marland Kitchen Appears in no distress. Respiratory work of breathing is normal. Cardiovascular Pedal pulses are robust on the right. Extremities are free of varicosities, clubbing or edema. Peripheral pulses strong and equal. Capillary refill < 3 seconds.. Neurological Virtually no sensation to  the microfilament or vibration sense. Notes Wound exam; small punched out wound over the plantar part of the right great toe interphalangeal joint. Rolled edges callus. Using a #5 curette extensive debridement of callus nonviable subcutaneous tissue and debris on the wound surface. No evidence of the infection . No palpable bone was Electronic Signature(s) Signed: 01/26/2020 3:52:22 AM By: Baltazar Najjar MD Entered By: Baltazar Najjar on 01/25/2020 16:23:49 -------------------------------------------------------------------------------- Physician Orders Details Patient Name: Date of Service: [place], [name] 01/25/2020 2:45 PM Medical Record Number: [id number] Patient Account Number: [id number] Date of Birth/Sex: Treating RN: 09-14-1955 (64 y.o. Melonie [place] Primary Care Provider: Cynda Familia Other Clinician: Referring Provider: Treating Provider/Extender: Willia Craze in Treatment: 0 Verbal / Phone Orders: No Diagnosis Coding Follow-up Appointments Return Appointment in 2 weeks. Dressing Change Frequency Change Dressing every other day. Wound Cleansing May shower and wash wound with soap and water. Primary Wound Dressing Silver Collagen - moisten with hydrogel Secondary Dressing Wound #4 Right T Great oe Dry Gauze - secure with tape Other: - felt Off-Loading Wound #4 Right T Great oe Other: - surgical shoe Radiology X-ray, foot right great toe - non healing wound right great toe Electronic Signature(s) Signed: 01/26/2020 3:52:22 AM By: Baltazar Najjar MD Signed: 01/26/2020 6:04:59 PM By: [name] RN Entered By: [name] on 01/25/2020 16:12:40 Prescription 01/25/2020 -------------------------------------------------------------------------------- Gennaro Africa MD Patient Name: Provider: 05/15/1956 [id number] Date of Birth: NPI#: [id number] Sex: DEA #: [phone] [id number]  Phone #: License #: Edyth Gunnels  [facility] Patient Address: [address] [place], [name] [address] [address] [place], [name] [address] [phone] Allergies No Known Drug Allergies Provider's Orders X-ray, foot right great toe - non healing wound right great toe Hand Signature: Date(s): Electronic Signature(s) Signed: 01/26/2020 3:52:22 AM By: Baltazar Najjar MD Signed: 01/26/2020 6:04:59 PM By: [name] RN Entered By: [name] on 01/25/2020 16:12:41 -------------------------------------------------------------------------------- Problem List Details Patient Name: Date of Service: [place], [name] 01/25/2020 2:45 PM Medical Record Number: [id number] Patient Account Number: [id number] Date of Birth/Sex: Treating RN: 1956/02/02 (64 y.o. Melonie [place] Primary Care Provider: Cynda Familia Other Clinician: Referring Provider: Treating Provider/Extender: Willia Craze in Treatment: 0 Active Problems ICD-10 Encounter Code Description Active Date MDM Diagnosis E11.621 Type 2 diabetes mellitus with foot ulcer 01/25/2020 No Yes E11.42 Type 2 diabetes mellitus with diabetic polyneuropathy 01/25/2020 No Yes L97.512 Non-pressure chronic ulcer of other part of right foot with fat layer exposed 01/25/2020 No Yes Inactive Problems Resolved Problems Electronic Signature(s) Signed: 01/26/2020 3:52:22 AM By: Baltazar Najjar MD Entered By: Baltazar Najjar on 01/25/2020 16:17:10 -------------------------------------------------------------------------------- Progress Note Details Patient Name: Date of Service: [name], [name] 01/25/2020 2:45 PM Medical Record Number: [id number] Patient Account Number: [id number] Date of Birth/Sex: Treating RN: April 08, 1956 (64 y.o. Melonie [place] Primary Care Provider: Cynda Familia Other Clinician: Referring Provider: Treating Provider/Extender: Willia Craze in Treatment:  0 Subjective Chief Complaint Information obtained from Patient Right 1st T Ulcer 01/25/2020; patient returns to clinic for review of a wound on again on the right plantar first toe oe History of Present Illness (HPI) 12/16/18 on evaluation today patient presents for initial evaluation our clinic as a self-referral due to a wound that is had on his right first toe for 6-8 weeks. He states his last hemoglobin A1c which was roughly 4 months ago was 11.0 he knows he has not been taking care of himself in this regard. He's actually been out of medications for two weeks both in regard to his diabetes as well as his high blood pressure. He states he is going to see those doctors in the next week. Subsequently he actually told me that being here in the clinic and seeing so many patients that did have applications in such has really got him thinking about the fact that he needs to take better care of himself in this regard. Fortunately there's no signs of active infection at this time which is good news. No fevers, chills, nausea, or vomiting noted at this time. He has been seen a local podiatrist for the past 6-8 weeks he was recommending for the patient that he soak in Epsom salts and come in on the patient tells me generally Tuesday and Thursday for debridement's and otherwise was told to leave the wound open to air just put a sock on and go about his business and that "it would scab up and be okay". With that being said the patient states when he realized things were not getting better and he really did not agree with the treatment plan he decided to get a second opinion here the wound care center. Apparently he's had x-rays although the patient's podiatrist has not sent [place] records at this point I think we may want to send them to the hospital for an x-ray just to have everything documented before we see about what we  need to do from the standpoint of more aggressive treatment such as potentially casting  all over this be his right foot that's gonna be somewhat difficult in my pinion. 12/23/18 on evaluation today patient actually appears to be doing okay in regard to his plantar foot ulcer. Unfortunately he was not able to go get the x-ray during the last visit here in the office. Subsequently he states that he actually forgot to go do that in the interim between last week and this week. That is something is gonna go do as soon as he leaves here today. We discussed again that I do think the total contact cast would be the appropriate way to go in the best way to get the news to heal. With that being said beginning the x-ray clear before we proceed with the cast he understands. 6/26; patient here for obligatory total contact cast change no complaints 12/30/18 on evaluation today patient actually appears to be doing excellent in regard to his plantar foot wound. This is looking tremendously better there's new callous buildup it's not as deep and overall show signs of excellent improvement. I'm very pleased in this regard. Overall I think that he should continue with the Current wound care measures including the total contact cast as I believe this is gonna be most helpful in getting this to heal very quickly. 01/06/19 on evaluation today patient actually appears to be doing quite well with regard to his toe ulcer. This is shown signs of good improvement in fact is measuring smaller by about half of what it was last week. Nonetheless I think this is excellent improvement is filled in an overall is making great progress. No fevers, chills, nausea, or vomiting noted at this time. 7/16; patient is here for review of a wound on the plantar aspect of his right great toe. Been using silver collagen under a total contact cast 01/20/19 on evaluation today patient actually presents for a slightly early follow-up due to the fact that his cast has been rubbing on the right medial ankle region. Unfortunately he has a new  wound at this location because of the rubbing of the cast. There does not appear to be signs of active infection which is good news. No fevers, chills, nausea, or vomiting noted at this time. Overall been pleased with how things have progressed in regard to his toe again today this appears to be doing well. 02/01/19-Patient returns at 12 days being out of the cast on account of going to the beach, he has been using Prisma to his wounds on his right medial ankle and right great toe plantar aspect. Both appear to be around the same dimensions. 02/08/19-Patient returns at 1 week after being in a cast both the wounds are better in fact the right great plantar wound is healed, right medial ankle wound looks better. We are using Prisma. Does not need to be in cast anymore 8/24; 2-week follow-up. Everything is healed here including the right great plantar toe and the cast injury on the medial ankle. He still has callus on the plantar right great toe. He has gone to [employer] I believe and has custom inserts at least. His wounds have closed over READMISSION 01/25/2020. This is a 64 year old man with poorly controlled type 2 diabetes with a recent hemoglobin A1c of 9.3. He also has diabetic neuropathy. We had him here extensively in 2020 again for a wound on the plantar aspect of the right great toe. Eventually we heal this over  and a total contact cast. He got custom inserts for his running shoes at [employer]. He has been using them reliably. He states that roughly 3 to 4 weeks ago he developed a thick callus on the bottom of the toe it eventually separated or split and underneath was an open wound. He saw his primary doctor who would and put him on doxycycline for 10 days. Past medical history type 2 diabetes with peripheral neuropathy, hyperlipidemia and hypertension. ABI in our clinic was 1.17 on the right Patient History Information obtained from Patient. Allergies No Known Drug Allergies Family  History Diabetes - Father, Hypertension - Father,Siblings, Lung Disease - Mother, No family history of Cancer, Heart Disease, Kidney Disease, Seizures, Stroke, Thyroid Problems, Tuberculosis. Social History Never smoker, Marital Status - Married, Alcohol Use - Rarely, Drug Use - No History, Caffeine Use - Rarely. Medical History Eyes Patient has history of Cataracts Denies history of Glaucoma, Optic Neuritis Ear/Nose/Mouth/Throat Denies history of Chronic sinus problems/congestion, Middle ear problems Respiratory Denies history of Aspiration, Asthma, Chronic Obstructive Pulmonary Disease (COPD), Pneumothorax, Sleep Apnea, Tuberculosis Cardiovascular Patient has history of Hypertension Denies history of Angina, Arrhythmia, Congestive Heart Failure, Coronary Artery Disease, Deep Vein Thrombosis, Hypotension, Myocardial Infarction, Peripheral Arterial Disease, Peripheral Venous Disease, Phlebitis, Vasculitis Gastrointestinal Denies history of Cirrhosis , Colitis, Crohnoos, Hepatitis A, Hepatitis B, Hepatitis C Endocrine Patient has history of Type II Diabetes Denies history of Type I Diabetes Genitourinary Denies history of End Stage Renal Disease Integumentary (Skin) Denies history of History of Burn Musculoskeletal Denies history of Gout, Rheumatoid Arthritis, Osteoarthritis, Osteomyelitis Neurologic Patient has history of Neuropathy Denies history of Dementia, Quadriplegia, Paraplegia, Seizure Disorder Oncologic Denies history of Received Chemotherapy, Received Radiation Psychiatric Patient has history of Confinement Anxiety Denies history of Anorexia/bulimia Hospitalization/Surgery History - cataract removed left eye. Medical A Surgical History Notes nd Eyes retinopathy Cardiovascular hyperlipidemia Review of Systems (ROS) Constitutional Symptoms (General Health) Complains or has symptoms of Fatigue. Denies complaints or symptoms of Fever, Chills, Marked Weight  Change. Ear/Nose/Mouth/Throat Denies complaints or symptoms of Chronic sinus problems or rhinitis. Respiratory Denies complaints or symptoms of Chronic or frequent coughs, Shortness of Breath. Cardiovascular Denies complaints or symptoms of Chest pain. Gastrointestinal Denies complaints or symptoms of Frequent diarrhea, Nausea, Vomiting. Endocrine Denies complaints or symptoms of Heat/cold intolerance. Genitourinary Denies complaints or symptoms of Frequent urination. Integumentary (Skin) Complains or has symptoms of Wounds - right great toe. Musculoskeletal Denies complaints or symptoms of Muscle Pain, Muscle Weakness. Neurologic Complains or has symptoms of Numbness/parasthesias. Psychiatric Complains or has symptoms of Claustrophobia. Denies complaints or symptoms of Suicidal. Objective Constitutional Patient is hypertensive.. Pulse regular and within target range for patient.Marland Kitchen Respirations regular, non-labored and within target range.. Temperature is normal and within the target range for the patient.Marland Kitchen Appears in no distress. Vitals Time Taken: 3:05 PM, Height: 70 in, Source: Stated, Weight: 222 lbs, Source: Stated, BMI: 31.9, Temperature: 98.4 F, Pulse: 74 bpm, Respiratory Rate: 18 breaths/min, Blood Pressure: 164/95 mmHg, Capillary Blood Glucose: 180 mg/dl. General Notes: glucose per pt report a few days ago Respiratory work of breathing is normal. Cardiovascular Pedal pulses are robust on the right. Extremities are free of varicosities, clubbing or edema. Peripheral pulses strong and equal. Capillary refill < 3 seconds.. Neurological Virtually no sensation to the microfilament or vibration sense. General Notes: Wound exam; small punched out wound over the plantar part of the right great toe interphalangeal joint. Rolled edges callus. Using a #5 curette extensive debridement of callus nonviable subcutaneous  tissue and debris on the wound surface. No evidence of the  infection . No palpable bone was Integumentary (Hair, Skin) Wound #4 status is Open. Original cause of wound was Blister. The wound is located on the Right T Great. The wound measures 0.6cm length x 0.3cm width oe x 0.5cm depth; 0.141cm^2 area and 0.071cm^3 volume. There is Fat Layer (Subcutaneous Tissue) Exposed exposed. There is no tunneling or undermining noted. There is a small amount of serosanguineous drainage noted. The wound margin is thickened. There is large (67-100%) red granulation within the wound bed. There is no necrotic tissue within the wound bed. Assessment Active Problems ICD-10 Type 2 diabetes mellitus with foot ulcer Type 2 diabetes mellitus with diabetic polyneuropathy Non-pressure chronic ulcer of other part of right foot with fat layer exposed Procedures Wound #4 Pre-procedure diagnosis of Wound #4 is a Diabetic Wound/Ulcer of the Lower Extremity located on the Right T Great .Severity of Tissue Pre Debridement is: oe Fat layer exposed. There was a Excisional Skin/Subcutaneous Tissue Debridement with a total area of 0.18 sq cm performed by Maxwell Caul., MD. With the following instrument(s): Curette to remove Viable and Non-Viable tissue/material. Material removed includes Callus, Subcutaneous Tissue, Slough, Skin: Dermis, and Skin: Epidermis after achieving pain control using Lidocaine 5% topical ointment. No specimens were taken. A time out was conducted at 16:05, prior to the start of the procedure. A Moderate amount of bleeding was controlled with Silver Nitrate. The procedure was tolerated well with a pain level of 0 throughout and a pain level of 0 following the procedure. Post Debridement Measurements: 0.6cm length x 0.3cm width x 0.5cm depth; 0.071cm^3 volume. Character of Wound/Ulcer Post Debridement is improved. Severity of Tissue Post Debridement is: Fat layer exposed. Post procedure Diagnosis Wound #4: Same as Pre-Procedure Plan Follow-up  Appointments: Return Appointment in 2 weeks. Dressing Change Frequency: Change Dressing every other day. Wound Cleansing: May shower and wash wound with soap and water. Primary Wound Dressing: Silver Collagen - moisten with hydrogel Secondary Dressing: Wound #4 Right T Great: oe Dry Gauze - secure with tape Other: - felt Off-Loading: Wound #4 Right T Great: oe Other: - surgical shoe Radiology ordered were: X-ray, foot right great toe - non healing wound right great toe 1. Silver collagen with hydrogel, gauze 2. Offload and healing sandal with felt 3. X-ray of the first toe 4. No cultures were felt to be necessary 5. He will probably require a total contact cast again. Looking through my notes from 2020 that resulted in quick closure of this wound. We will see how this does in the next week or 2 and make a decision about that Electronic Signature(s) Signed: 01/26/2020 3:52:22 AM By: Baltazar Najjar MD Entered By: Baltazar Najjar on 01/25/2020 16:26:06 -------------------------------------------------------------------------------- HxROS Details Patient Name: Date of Service: [name], [name] 01/25/2020 2:45 PM Medical Record Number: [id number] Patient Account Number: [id number] Date of Birth/Sex: Treating RN: 04-Jun-1956 (64 y.o. [name] Primary Care Provider: Cynda Familia Other Clinician: Referring Provider: Treating Provider/Extender: Willia Craze in Treatment: 0 Information Obtained From Patient Constitutional Symptoms (General Health) Complaints and Symptoms: Positive for: Fatigue Negative for: Fever; Chills; Marked Weight Change Ear/Nose/Mouth/Throat Complaints and Symptoms: Negative for: Chronic sinus problems or rhinitis Medical History: Negative for: Chronic sinus problems/congestion; Middle ear problems Respiratory Complaints and Symptoms: Negative for: Chronic or frequent coughs; Shortness of Breath Medical  History: Negative for: Aspiration; Asthma; Chronic Obstructive Pulmonary Disease (COPD); Pneumothorax; Sleep Apnea; Tuberculosis Cardiovascular  Complaints and Symptoms: Negative for: Chest pain Medical History: Positive for: Hypertension Negative for: Angina; Arrhythmia; Congestive Heart Failure; Coronary Artery Disease; Deep Vein Thrombosis; Hypotension; Myocardial Infarction; Peripheral Arterial Disease; Peripheral Venous Disease; Phlebitis; Vasculitis Past Medical History Notes: hyperlipidemia Gastrointestinal Complaints and Symptoms: Negative for: Frequent diarrhea; Nausea; Vomiting Medical History: Negative for: Cirrhosis ; Colitis; Crohns; Hepatitis A; Hepatitis B; Hepatitis C Endocrine Complaints and Symptoms: Negative for: Heat/cold intolerance Medical History: Positive for: Type II Diabetes Negative for: Type I Diabetes Time with diabetes: 8 years Treated with: Insulin, Oral agents Blood sugar tested every day: No Genitourinary Complaints and Symptoms: Negative for: Frequent urination Medical History: Negative for: End Stage Renal Disease Integumentary (Skin) Complaints and Symptoms: Positive for: Wounds - right great toe Medical History: Negative for: History of Burn Musculoskeletal Complaints and Symptoms: Negative for: Muscle Pain; Muscle Weakness Medical History: Negative for: Gout; Rheumatoid Arthritis; Osteoarthritis; Osteomyelitis Neurologic Complaints and Symptoms: Positive for: Numbness/parasthesias Medical History: Positive for: Neuropathy Negative for: Dementia; Quadriplegia; Paraplegia; Seizure Disorder Psychiatric Complaints and Symptoms: Positive for: Claustrophobia Negative for: Suicidal Medical History: Positive for: Confinement Anxiety Negative for: Anorexia/bulimia Eyes Medical History: Positive for: Cataracts Negative for: Glaucoma; Optic Neuritis Past Medical History  Notes: retinopathy Hematologic/Lymphatic Immunological Oncologic Medical History: Negative for: Received Chemotherapy; Received Radiation HBO Extended History Items Eyes: Cataracts Immunizations Pneumococcal Vaccine: Received Pneumococcal Vaccination: No Implantable Devices None Hospitalization / Surgery History Type of Hospitalization/Surgery cataract removed left eye Family and Social History Cancer: No; Diabetes: Yes - Father; Heart Disease: No; Hypertension: Yes - Father,Siblings; Kidney Disease: No; Lung Disease: Yes - Mother; Seizures: No; Stroke: No; Thyroid Problems: No; Tuberculosis: No; Never smoker; Marital Status - Married; Alcohol Use: Rarely; Drug Use: No History; Caffeine Use: Rarely; Financial Concerns: No; Food, Clothing or Shelter Needs: No; Support System Lacking: No; Transportation Concerns: No Electronic Signature(s) Signed: 01/25/2020 5:47:23 PM By: Zenaida DeedBoehlein, Linda RN, BSN Signed: 01/26/2020 3:52:22 AM By: Baltazar Najjarobson, Deondray Ospina MD Entered By: Zenaida DeedBoehlein, Linda on 01/25/2020 15:14:45 -------------------------------------------------------------------------------- SuperBill Details Patient Name: Date of Service: Bart[place]GGINS, [place] RK 01/25/2020 Medical Record Number: [id number] Patient Account Number: [id number] Date of Birth/Sex: Treating RN: 1956/03/09 (64 y.o. 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 Facility Procedures CPT4 Code: [id number] Description: [id number] - WOUND CARE VISIT-LEV 2 EST PT Modifier: 25 Quantity: 1 CPT4 Code: [id number] Description: 11042 - DEB SUBQ TISSUE 20 SQ CM/< ICD-10  Diagnosis Description L97.512 Non-pressure chronic ulcer of other part of right foot with fat layer exposed Modifier: Quantity: 1 Physician Procedures : CPT4 Code Description Modifier [id number] 99213 - WC PHYS LEVEL 3 - EST PT 25 ICD-10 Diagnosis Description E11.621 Type 2 diabetes mellitus with foot ulcer E11.42 Type 2 diabetes mellitus with diabetic polyneuropathy L97.512 Non-pressure chronic ulcer of  other part of right foot with fat layer exposed Quantity: 1 : [id number] 11042 - WC PHYS SUBQ TISS 20 SQ CM ICD-10 Diagnosis Description L97.512 Non-pressure chronic ulcer of other part of right foot with fat layer exposed Quantity: 1 Electronic Signature(s) Signed: 01/26/2020 6:04:59 PM By: Yevonne PaxEpps, Carrie RN Signed: 02/15/2020 9:35:46 AM By: Baltazar Najjarobson, Koleton Duchemin MD Previous Signature: 01/26/2020 3:52:22 AM Version By: Baltazar Najjarobson, Link Burgeson MD Entered By: Yevonne PaxEpps, Carrie on 01/26/2020 07:50:38

## 2020-02-25 ENCOUNTER — Encounter (HOSPITAL_BASED_OUTPATIENT_CLINIC_OR_DEPARTMENT_OTHER): Payer: 59 | Admitting: Internal Medicine

## 2020-02-25 DIAGNOSIS — E11621 Type 2 diabetes mellitus with foot ulcer: Secondary | ICD-10-CM | POA: Diagnosis not present

## 2020-02-27 NOTE — Progress Notes (Signed)
DILRAJ, KILLGORE ([id number]) Visit Report for 02/25/2020 Debridement Details Patient Name: Date of Service: [place], [name] 02/25/2020 8:00 A M Medical Record Number: [id number] Patient Account Number: [id number] Date of Birth/Sex: Treating RN: January 06, 1956 (64 y.o. Katherina Right Primary Care Provider: Cynda Familia Other Clinician: Referring Provider: Treating Provider/Extender: Willia Craze in Treatment: 4 Debridement Performed for Assessment: Wound #4 Right T Great oe Performed By: Physician Maxwell Caul., MD Debridement Type: Debridement Severity of Tissue Pre Debridement: Fat layer exposed Level of Consciousness (Pre-procedure): Awake and Alert Pre-procedure Verification/Time Out Yes - 08:35 Taken: Start Time: 08:35 Pain Control: Other : benzocaine, 20% T Area Debrided (L x W): otal 2 (cm) x 2 (cm) = 4 (cm) Tissue and other material debrided: Viable, Non-Viable, Callus, Subcutaneous Level: Skin/Subcutaneous Tissue Debridement Description: Excisional Instrument: Blade, Forceps Bleeding: Minimum Hemostasis Achieved: Pressure End Time: 08:36 Procedural Pain: 0 Post Procedural Pain: 0 Response to Treatment: Procedure was tolerated well Level of Consciousness (Post- Awake and Alert procedure): Post Debridement Measurements of Total Wound Length: (cm) 0.6 Width: (cm) 0.3 Depth: (cm) 0.6 Volume: (cm) 0.085 Character of Wound/Ulcer Post Debridement: Improved Severity of Tissue Post Debridement: Fat layer exposed Post Procedure Diagnosis Same as Pre-procedure Electronic Signature(s) Signed: 02/25/2020 5:56:33 PM By: Cherylin Mylar Signed: 02/27/2020 7:39:18 AM By: Baltazar Najjar MD Entered By: Cherylin Mylar on 02/25/2020 08:36:18 -------------------------------------------------------------------------------- HPI Details Patient Name: Date of Service: [name], [name] 02/25/2020 8:00 A M Medical Record Number: [id number] Patient  Account Number: [id number] Date of Birth/Sex: Treating RN: Oct 22, 1955 (64 y.o. Katherina Right Primary Care Provider: Cynda Familia Other Clinician: Referring Provider: Treating Provider/Extender: Willia Craze in Treatment: 4 History of Present Illness HPI Description: 12/16/18 on evaluation today patient presents for initial evaluation our clinic as a self-referral due to a wound that is had on his right first toe for 6-8 weeks. He states his last hemoglobin A1c which was roughly 4 months ago was 11.0 he knows he has not been taking care of himself in this regard. He's actually been out of medications for two weeks both in regard to his diabetes as well as his high blood pressure. He states he is going to see those doctors in the next week. Subsequently he actually told me that being here in the clinic and seeing so many patients that did have applications in such has really got him thinking about the fact that he needs to take better care of himself in this regard. Fortunately there's no signs of active infection at this time which is good news. No fevers, chills, nausea, or vomiting noted at this time. He has been seen a local podiatrist for the past 6-8 weeks he was recommending for the patient that he soak in Epsom salts and come in on the patient tells me generally Tuesday and Thursday for debridement's and otherwise was told to leave the wound open to air just put a sock on and go about his business and that "it would scab up and be okay". With that being said the patient states when he realized things were not getting better and he really did not agree with the treatment plan he decided to get a second opinion here the wound care center. Apparently he's had x-rays although the patient's podiatrist has not sent [place] records at this point I think we may want to send them to the hospital for an x-ray just to have everything documented before we see about  what we  need to do from the standpoint of more aggressive treatment such as potentially casting all over this be his right foot that's gonna be somewhat difficult in my pinion. 12/23/18 on evaluation today patient actually appears to be doing okay in regard to his plantar foot ulcer. Unfortunately he was not able to go get the x-ray during the last visit here in the office. Subsequently he states that he actually forgot to go do that in the interim between last week and this week. That is something is gonna go do as soon as he leaves here today. We discussed again that I do think the total contact cast would be the appropriate way to go in the best way to get the news to heal. With that being said beginning the x-ray clear before we proceed with the cast he understands. 6/26; patient here for obligatory total contact cast change no complaints 12/30/18 on evaluation today patient actually appears to be doing excellent in regard to his plantar foot wound. This is looking tremendously better there's new callous buildup it's not as deep and overall show signs of excellent improvement. I'm very pleased in this regard. Overall I think that he should continue with the Current wound care measures including the total contact cast as I believe this is gonna be most helpful in getting this to heal very quickly. 01/06/19 on evaluation today patient actually appears to be doing quite well with regard to his toe ulcer. This is shown signs of good improvement in fact is measuring smaller by about half of what it was last week. Nonetheless I think this is excellent improvement is filled in an overall is making great progress. No fevers, chills, nausea, or vomiting noted at this time. 7/16; patient is here for review of a wound on the plantar aspect of his right great toe. Been using silver collagen under a total contact cast 01/20/19 on evaluation today patient actually presents for a slightly early follow-up due to the fact that  his cast has been rubbing on the right medial ankle region. Unfortunately he has a new wound at this location because of the rubbing of the cast. There does not appear to be signs of active infection which is good news. No fevers, chills, nausea, or vomiting noted at this time. Overall been pleased with how things have progressed in regard to his toe again today this appears to be doing well. 02/01/19-Patient returns at 12 days being out of the cast on account of going to the beach, he has been using Prisma to his wounds on his right medial ankle and right great toe plantar aspect. Both appear to be around the same dimensions. 02/08/19-Patient returns at 1 week after being in a cast both the wounds are better in fact the right great plantar wound is healed, right medial ankle wound looks better. We are using Prisma. Does not need to be in cast anymore 8/24; 2-week follow-up. Everything is healed here including the right great plantar toe and the cast injury on the medial ankle. He still has callus on the plantar right great toe. He has gone to hang[employer] I believe and has custom inserts at least. His wounds have closed over READMISSION 01/25/2020. This is a 64 year old man with poorly controlled type 2 diabetes with a recent hemoglobin A1c of 9.3. He also has diabetic neuropathy. We had him here extensively in 2020 again for a wound on the plantar aspect of the right great toe. Eventually we heal  this over and a total contact cast. He got custom inserts for his running shoes at [employer]. He has been using them reliably. He states that roughly 3 to 4 weeks ago he developed a thick callus on the bottom of the toe it eventually separated or split and underneath was an open wound. He saw his primary doctor who would and put him on doxycycline for 10 days. Past medical history type 2 diabetes with peripheral neuropathy, hyperlipidemia and hypertension. ABI in our clinic was 1.17 on the right 02/11/20-Patient  back at 2 weeks, right great toe plantar wound is about the same with slight callus build up. 8/27; 2-week follow-up right plantar toe considerable callus buildup. We have been using silver collagen. This is definitely going to require a total contact cast however a week today his daughter gets married we elected to delay that until the Monday after that I E 10 days from now. Electronic Signature(s) Signed: 02/27/2020 7:39:18 AM By: Baltazar Najjar MD Entered By: Baltazar Najjar on 02/25/2020 09:01:46 -------------------------------------------------------------------------------- Physical Exam Details Patient Name: Date of Service: [name], [name] 02/25/2020 8:00 A M Medical Record Number: [id number] Patient Account Number: [id number] Date of Birth/Sex: Treating RN: Jun 11, 1956 (64 y.o. Katherina Right Primary Care Provider: Cynda Familia Other Clinician: Referring Provider: Treating Provider/Extender: Willia Craze in Treatment: 4 Constitutional Patient is hypertensive.. Pulse regular and within target range for patient.Marland Kitchen Respirations regular, non-labored and within target range.. Temperature is normal and within the target range for the patient.Marland Kitchen Appears in no distress. Notes Wound exam; right great toe plantar aspect care. Copious amounts of callus and thick subcutaneous tissue removed with pickups and a 15 blade. Hemostasis with direct pressure the surface of the tissue underneath does not look too bad. There was no evidence of infection no purulence no odor. This does not probe to bone. No surrounding erythema Electronic Signature(s) Signed: 02/27/2020 7:39:18 AM By: Baltazar Najjar MD Entered By: Baltazar Najjar on 02/25/2020 09:02:38 -------------------------------------------------------------------------------- Physician Orders Details Patient Name: Date of Service: [name], [name] 02/25/2020 8:00 A M Medical Record Number: [id number] Patient  Account Number: [id number] Date of Birth/Sex: Treating RN: 19-Dec-1955 (64 y.o. Katherina Right Primary Care Provider: Cynda Familia Other Clinician: Referring Provider: Treating Provider/Extender: Willia Craze in Treatment: 4 Verbal / Phone Orders: No Diagnosis Coding ICD-10 Coding Code Description E11.621 Type 2 diabetes mellitus with foot ulcer E11.42 Type 2 diabetes mellitus with diabetic polyneuropathy L97.512 Non-pressure chronic ulcer of other part of right foot with fat layer exposed Follow-up Appointments ppointment in 2 weeks. - Tuesday, Sept 7th for cast application Return A Dressing Change Frequency Change dressing every day. Wound Cleansing May shower and wash wound with soap and water. Primary Wound Dressing Calcium Alginate with Silver Secondary Dressing Wound #4 Right T Great oe Dry Gauze - secure with tape Other: - felt Off-Loading Wound #4 Right T Great oe Other: - surgical shoe Electronic Signature(s) Signed: 02/25/2020 5:56:33 PM By: Cherylin Mylar Signed: 02/27/2020 7:39:18 AM By: Baltazar Najjar MD Entered By: Cherylin Mylar on 02/25/2020 08:40:33 -------------------------------------------------------------------------------- Problem List Details Patient Name: Date of Service: [name], [name] 02/25/2020 8:00 A M Medical Record Number: [id number] Patient Account Number: [id number] Date of Birth/Sex: Treating RN: March 19, 1956 (64 y.o. Katherina Right Primary Care Provider: Other Clinician: Cynda Familia Referring Provider: Treating Provider/Extender: Willia Craze in Treatment: 4 Active Problems ICD-10 Encounter Code Description Active Date MDM Diagnosis E11.621 Type 2  diabetes mellitus with foot ulcer 01/25/2020 No Yes E11.42 Type 2 diabetes mellitus with diabetic polyneuropathy 01/25/2020 No Yes L97.512 Non-pressure chronic ulcer of other part of right foot with fat layer  exposed 01/25/2020 No Yes Inactive Problems Resolved Problems Electronic Signature(s) Signed: 02/27/2020 7:39:18 AM By: Baltazar Najjar MD Entered By: Baltazar Najjar on 02/25/2020 09:00:41 -------------------------------------------------------------------------------- Progress Note Details Patient Name: Date of Service: [name], [name] 02/25/2020 8:00 A M Medical Record Number: [id number] Patient Account Number: [id number] Date of Birth/Sex: Treating RN: 1956-03-20 (64 y.o. Katherina Right Primary Care Provider: Cynda Familia Other Clinician: Referring Provider: Treating Provider/Extender: Willia Craze in Treatment: 4 Subjective History of Present Illness (HPI) 12/16/18 on evaluation today patient presents for initial evaluation our clinic as a self-referral due to a wound that is had on his right first toe for 6-8 weeks. He states his last hemoglobin A1c which was roughly 4 months ago was 11.0 he knows he has not been taking care of himself in this regard. He's actually been out of medications for two weeks both in regard to his diabetes as well as his high blood pressure. He states he is going to see those doctors in the next week. Subsequently he actually told me that being here in the clinic and seeing so many patients that did have applications in such has really got him thinking about the fact that he needs to take better care of himself in this regard. Fortunately there's no signs of active infection at this time which is good news. No fevers, chills, nausea, or vomiting noted at this time. He has been seen a local podiatrist for the past 6-8 weeks he was recommending for the patient that he soak in Epsom salts and come in on the patient tells me generally Tuesday and Thursday for debridement's and otherwise was told to leave the wound open to air just put a sock on and go about his business and that "it would scab up and be okay". With that being said  the patient states when he realized things were not getting better and he really did not agree with the treatment plan he decided to get a second opinion here the wound care center. Apparently he's had x-rays although the patient's podiatrist has not sent [place] records at this point I think we may want to send them to the hospital for an x-ray just to have everything documented before we see about what we need to do from the standpoint of more aggressive treatment such as potentially casting all over this be his right foot that's gonna be somewhat difficult in my pinion. 12/23/18 on evaluation today patient actually appears to be doing okay in regard to his plantar foot ulcer. Unfortunately he was not able to go get the x-ray during the last visit here in the office. Subsequently he states that he actually forgot to go do that in the interim between last week and this week. That is something is gonna go do as soon as he leaves here today. We discussed again that I do think the total contact cast would be the appropriate way to go in the best way to get the news to heal. With that being said beginning the x-ray clear before we proceed with the cast he understands. 6/26; patient here for obligatory total contact cast change no complaints 12/30/18 on evaluation today patient actually appears to be doing excellent in regard to his plantar foot wound. This is looking tremendously  better there's new callous buildup it's not as deep and overall show signs of excellent improvement. I'm very pleased in this regard. Overall I think that he should continue with the Current wound care measures including the total contact cast as I believe this is gonna be most helpful in getting this to heal very quickly. 01/06/19 on evaluation today patient actually appears to be doing quite well with regard to his toe ulcer. This is shown signs of good improvement in fact is measuring smaller by about half of what it was last week.  Nonetheless I think this is excellent improvement is filled in an overall is making great progress. No fevers, chills, nausea, or vomiting noted at this time. 7/16; patient is here for review of a wound on the plantar aspect of his right great toe. Been using silver collagen under a total contact cast 01/20/19 on evaluation today patient actually presents for a slightly early follow-up due to the fact that his cast has been rubbing on the right medial ankle region. Unfortunately he has a new wound at this location because of the rubbing of the cast. There does not appear to be signs of active infection which is good news. No fevers, chills, nausea, or vomiting noted at this time. Overall been pleased with how things have progressed in regard to his toe again today this appears to be doing well. 02/01/19-Patient returns at 12 days being out of the cast on account of going to the beach, he has been using Prisma to his wounds on his right medial ankle and right great toe plantar aspect. Both appear to be around the same dimensions. 02/08/19-Patient returns at 1 week after being in a cast both the wounds are better in fact the right great plantar wound is healed, right medial ankle wound looks better. We are using Prisma. Does not need to be in cast anymore 8/24; 2-week follow-up. Everything is healed here including the right great plantar toe and the cast injury on the medial ankle. He still has callus on the plantar right great toe. He has gone to [employer] I believe and has custom inserts at least. His wounds have closed over READMISSION 01/25/2020. This is a 64 year old man with poorly controlled type 2 diabetes with a recent hemoglobin A1c of 9.3. He also has diabetic neuropathy. We had him here extensively in 2020 again for a wound on the plantar aspect of the right great toe. Eventually we heal this over and a total contact cast. He got custom inserts for his running shoes at [employer]. He has been using  them reliably. He states that roughly 3 to 4 weeks ago he developed a thick callus on the bottom of the toe it eventually separated or split and underneath was an open wound. He saw his primary doctor who would and put him on doxycycline for 10 days. Past medical history type 2 diabetes with peripheral neuropathy, hyperlipidemia and hypertension. ABI in our clinic was 1.17 on the right 02/11/20-Patient back at 2 weeks, right great toe plantar wound is about the same with slight callus build up. 8/27; 2-week follow-up right plantar toe considerable callus buildup. We have been using silver collagen. This is definitely going to require a total contact cast however a week today his daughter gets married we elected to delay that until the Monday after that I E 10 days from now. Objective Constitutional Patient is hypertensive.. Pulse regular and within target range for patient.Marland Kitchen Respirations regular, non-labored and within target range.Marland Kitchen  Temperature is normal and within the target range for the patient.Marland Kitchen. Appears in no distress. Vitals Time Taken: 7:59 AM, Height: 70 in, Weight: 222 lbs, BMI: 31.9, Temperature: 97.9 F, Pulse: 75 bpm, Respiratory Rate: 18 breaths/min, Blood Pressure: 176/97 mmHg. General Notes: Wound exam; right great toe plantar aspect care. Copious amounts of callus and thick subcutaneous tissue removed with pickups and a 15 blade. Hemostasis with direct pressure the surface of the tissue underneath does not look too bad. There was no evidence of infection no purulence no odor. This does not probe to bone. No surrounding erythema Integumentary (Hair, Skin) Wound #4 status is Open. Original cause of wound was Blister. The wound is located on the Right T Great. The wound measures 0.6cm length x 0.3cm width oe x 0.6cm depth; 0.141cm^2 area and 0.085cm^3 volume. There is Fat Layer (Subcutaneous Tissue) exposed. There is no tunneling noted, however, there is undermining starting at 12:00  and ending at 12:00 with a maximum distance of 1cm. There is a small amount of serosanguineous drainage noted. The wound margin is thickened. There is large (67-100%) red granulation within the wound bed. There is no necrotic tissue within the wound bed. Assessment Active Problems ICD-10 Type 2 diabetes mellitus with foot ulcer Type 2 diabetes mellitus with diabetic polyneuropathy Non-pressure chronic ulcer of other part of right foot with fat layer exposed Procedures Wound #4 Pre-procedure diagnosis of Wound #4 is a Diabetic Wound/Ulcer of the Lower Extremity located on the Right T Great .Severity of Tissue Pre Debridement is: oe Fat layer exposed. There was a Excisional Skin/Subcutaneous Tissue Debridement with a total area of 4 sq cm performed by Maxwell Caulobson, Wallis Vancott G., MD. With the following instrument(s): Blade, and Forceps to remove Viable and Non-Viable tissue/material. Material removed includes Callus and Subcutaneous Tissue and after achieving pain control using Other (benzocaine, 20%). No specimens were taken. A time out was conducted at 08:35, prior to the start of the procedure. A Minimum amount of bleeding was controlled with Pressure. The procedure was tolerated well with a pain level of 0 throughout and a pain level of 0 following the procedure. Post Debridement Measurements: 0.6cm length x 0.3cm width x 0.6cm depth; 0.085cm^3 volume. Character of Wound/Ulcer Post Debridement is improved. Severity of Tissue Post Debridement is: Fat layer exposed. Post procedure Diagnosis Wound #4: Same as Pre-Procedure Plan Follow-up Appointments: Return Appointment in 2 weeks. - Tuesday, Sept 7th for cast application Dressing Change Frequency: Change dressing every day. Wound Cleansing: May shower and wash wound with soap and water. Primary Wound Dressing: Calcium Alginate with Silver Secondary Dressing: Wound #4 Right T Great: oe Dry Gauze - secure with tape Other: -  felt Off-Loading: Wound #4 Right T Great: oe Other: - surgical shoe #1 I change the primary dressing to silver alginate. 2. I do not believe he is actually offloading this properly. 3. No current evidence of infection 4. He has done well with a total contact cast in the past. I asked him to prepare to have somebody to drive him in 10 days Electronic Signature(s) Signed: 02/27/2020 7:39:18 AM By: Baltazar Najjarobson, Bettymae Yott MD Entered By: Baltazar Najjarobson, Brookelle Pellicane on 02/25/2020 09:04:58 -------------------------------------------------------------------------------- SuperBill Details Patient Name: Date of Service: Lorrene ReidUGGINS, [name] 02/25/2020 Medical Record Number: [id number] Patient Account Number: [id number] Date of Birth/Sex: Treating RN: 14-Sep-1955 (64 y.o. Katherina RightM) Dwiggins, Shannon Primary Care Provider: Cynda FamiliaMeyers, Stephen C Other Clinician: Referring Provider: Treating Provider/Extender: Willia Crazeobson, Anna Beaird Meyers, Stephen C Weeks in Treatment: 4 Diagnosis Coding ICD-10 Codes Code Description [phone]  Type 2 diabetes mellitus with foot ulcer E11.42 Type 2 diabetes mellitus with diabetic polyneuropathy L97.512 Non-pressure chronic ulcer of other part of right foot with fat layer exposed Facility Procedures CPT4 Code: [id number] Description: 11042 - DEB SUBQ TISSUE 20 SQ CM/< ICD-10 Diagnosis Description L97.512 Non-pressure chronic ulcer of other part of right foot with fat layer exposed E11.621 Type 2 diabetes mellitus with foot ulcer Modifier: Quantity: 1 Physician Procedures Electronic Signature(s) Signed: 02/27/2020 7:39:18 AM By: Baltazar Najjar MD Entered By: Baltazar Najjar on 02/25/2020 09:05:15

## 2020-02-29 ENCOUNTER — Other Ambulatory Visit: Payer: Self-pay

## 2020-02-29 DIAGNOSIS — E7800 Pure hypercholesterolemia, unspecified: Secondary | ICD-10-CM

## 2020-03-01 NOTE — Progress Notes (Signed)
GLEEN, RIPBERGER ([id number]) Visit Report for 02/25/2020 Arrival Information Details Patient Name: Date of Service: [place], [name] 02/25/2020 8:00 A M Medical Record Number: [id number] Patient Account Number: [id number] Date of Birth/Sex: Treating RN: 1956/04/12 (64 y.o. Katherina Right Primary Care Stockton Nunley: Cynda Familia Other Clinician: Referring Jaqwan Wieber: Treating Zamarian Scarano/Extender: Willia Craze in Treatment: 4 Visit Information History Since Last Visit Added or deleted any medications: No Patient Arrived: Ambulatory Any new allergies or adverse reactions: No Arrival Time: 07:56 Had a fall or experienced change in No Accompanied By: self activities of daily living that may affect Transfer Assistance: None risk of falls: Patient Identification Verified: Yes Signs or symptoms of abuse/neglect since last visito No Secondary Verification Process Completed: Yes Hospitalized since last visit: No Patient Requires Transmission-Based Precautions: No Implantable device outside of the clinic excluding No Patient Has Alerts: No cellular tissue based products placed in the center since last visit: Has Dressing in Place as Prescribed: Yes Pain Present Now: No Electronic Signature(s) Signed: 03/01/2020 11:00:36 AM By: Karl Ito Entered By: Karl Ito on 02/25/2020 07:59:15 -------------------------------------------------------------------------------- Encounter Discharge Information Details Patient Name: Date of Service: [name], [name] 02/25/2020 8:00 A M Medical Record Number: [id number] Patient Account Number: [id number] Date of Birth/Sex: Treating RN: Feb 06, 1956 (64 y.o. [name] Primary Care Ziv Welchel: Cynda Familia Other Clinician: Referring Huriel Matt: Treating Julius Boniface/Extender: Willia Craze in Treatment: 4 Encounter Discharge Information Items Post Procedure Vitals Discharge Condition:  Stable Temperature (F): 97.9 Ambulatory Status: Ambulatory Pulse (bpm): 75 Discharge Destination: Home Respiratory Rate (breaths/min): 18 Transportation: Private Auto Blood Pressure (mmHg): 176/97 Accompanied By: self Schedule Follow-up Appointment: Yes Clinical Summary of Care: Patient Declined Electronic Signature(s) Signed: 02/25/2020 6:00:28 PM By: Zenaida Deed RN, BSN Entered By: Zenaida Deed on 02/25/2020 08:58:23 -------------------------------------------------------------------------------- Lower Extremity Assessment Details Patient Name: Date of Service: [place], [name] 02/25/2020 8:00 A M Medical Record Number: [id number] Patient Account Number: [id number] Date of Birth/Sex: Treating RN: Nov 25, 1955 (64 y.o. Katherina Right Primary Care Logen Fowle: Cynda Familia Other Clinician: Referring Grazia Taffe: Treating Varick Keys/Extender: Willia Craze in Treatment: 4 Edema Assessment Assessed: Kyra Searles: No] Franne Forts: No] Edema: [Left: N] [Right: o] Calf Left: Right: Point of Measurement: cm From Medial Instep cm 38 cm Ankle Left: Right: Point of Measurement: cm From Medial Instep cm 22 cm Vascular Assessment Pulses: Dorsalis Pedis Palpable: [Right:Yes] Electronic Signature(s) Signed: 02/25/2020 5:56:33 PM By: Cherylin Mylar Entered By: Cherylin Mylar on 02/25/2020 08:10:55 -------------------------------------------------------------------------------- Multi Wound Chart Details Patient Name: Date of Service: [name], [name] 02/25/2020 8:00 A M Medical Record Number: [id number] Patient Account Number: [id number] Date of Birth/Sex: Treating RN: 06-26-1956 (64 y.o. Katherina Right Primary Care Emit Kuenzel: Cynda Familia Other Clinician: Referring Nash Bolls: Treating Vestal Markin/Extender: Willia Craze in Treatment: 4 Vital Signs Height(in): 70 Pulse(bpm): 75 Weight(lbs): 222 Blood Pressure(mmHg):  176/97 Body Mass Index(BMI): 32 Temperature(F): 97.9 Respiratory Rate(breaths/min): 18 Photos: [4:No Photos Right T Great oe] [N/A:N/A N/A] Wound Location: [4:Blister] [N/A:N/A] Wounding Event: [4:Diabetic Wound/Ulcer of the Lower] [N/A:N/A] Primary Etiology: [4:Extremity Cataracts, Hypertension, Type II] [N/A:N/A] Comorbid History: [4:Diabetes, Neuropathy, Confinement Anxiety 12/24/2019] [N/A:N/A] Date Acquired: [4:4] [N/A:N/A] Weeks of Treatment: [4:Open] [N/A:N/A] Wound Status: [4:0.6x0.3x0.6] [N/A:N/A] Measurements L x W x D (cm) [4:0.141] [N/A:N/A] A (cm) : rea [4:0.085] [N/A:N/A] Volume (cm) : [4:0.00%] [N/A:N/A] % Reduction in A rea: [4:-19.70%] [N/A:N/A] % Reduction in Volume: [4:12] Starting Position 1 (o'clock): [4:12] Ending Position 1 (o'clock): [4:1] Maximum  Distance 1 (cm): [4:Yes] [N/A:N/A] Undermining: [4:Grade 2] [N/A:N/A] Classification: [4:Small] [N/A:N/A] Exudate A mount: [4:Serosanguineous] [N/A:N/A] Exudate Type: [4:red, brown] [N/A:N/A] Exudate Color: [4:Thickened] [N/A:N/A] Wound Margin: [4:Large (67-100%)] [N/A:N/A] Granulation A mount: [4:Red] [N/A:N/A] Granulation Quality: [4:None Present (0%)] [N/A:N/A] Necrotic A mount: [4:Fat Layer (Subcutaneous Tissue): Yes N/A] Exposed Structures: [4:Fascia: No Tendon: No Muscle: No Joint: No Bone: No Small (1-33%)] [N/A:N/A] Epithelialization: [4:Debridement - Excisional] [N/A:N/A] Debridement: Pre-procedure Verification/Time Out 08:35 [N/A:N/A] Taken: [4:Other] [N/A:N/A] Pain Control: [4:Callus, Subcutaneous] [N/A:N/A] Tissue Debrided: [4:Skin/Subcutaneous Tissue] [N/A:N/A] Level: [4:4] [N/A:N/A] Debridement A (sq cm): [4:rea Blade, Forceps] [N/A:N/A] Instrument: [4:Minimum] [N/A:N/A] Bleeding: [4:Pressure] [N/A:N/A] Hemostasis A chieved: [4:0] [N/A:N/A] Procedural Pain: [4:0] [N/A:N/A] Post Procedural Pain: [4:Procedure was tolerated well] [N/A:N/A] Debridement Treatment Response: [4:0.6x0.3x0.6]  [N/A:N/A] Post Debridement Measurements L x W x D (cm) [4:0.085] [N/A:N/A] Post Debridement Volume: (cm) [4:Debridement] [N/A:N/A] Treatment Notes Wound #4 (Right Toe Great) 3. Primary Dressing Applied Calcium Alginate Ag 4. Secondary Dressing Dry Gauze Roll Gauze 7. Footwear/Offloading device applied Felt/Foam Surgical shoe Electronic Signature(s) Signed: 02/25/2020 5:56:33 PM By: Cherylin Mylar Signed: 02/27/2020 7:39:18 AM By: Baltazar Najjar MD Entered By: Baltazar Najjar on 02/25/2020 09:00:50 -------------------------------------------------------------------------------- Multi-Disciplinary Care Plan Details Patient Name: Date of Service: [place], [name] 02/25/2020 8:00 A M Medical Record Number: [id number] Patient Account Number: [id number] Date of Birth/Sex: Treating RN: 1955-07-06 (64 y.o. Katherina Right Primary Care Laroy Mustard: Cynda Familia Other Clinician: Referring Tametra Ahart: Treating Ruth Kovich/Extender: Willia Craze in Treatment: 4 Active Inactive Wound/Skin Impairment Nursing Diagnoses: Knowledge deficit related to ulceration/compromised skin integrity Goals: Patient/caregiver will verbalize understanding of skin care regimen Date Initiated: 01/25/2020 Target Resolution Date: 03/17/2020 Goal Status: Active Ulcer/skin breakdown will have a volume reduction of 30% by week 4 Date Initiated: 01/25/2020 Target Resolution Date: 03/17/2020 Goal Status: Active Interventions: Assess patient/caregiver ability to obtain necessary supplies Assess patient/caregiver ability to perform ulcer/skin care regimen upon admission and as needed Assess ulceration(s) every visit Notes: Electronic Signature(s) Signed: 02/25/2020 5:56:33 PM By: Cherylin Mylar Entered By: Cherylin Mylar on 02/25/2020 07:53:15 -------------------------------------------------------------------------------- Pain Assessment Details Patient Name: Date of  Service: [name], [name] 02/25/2020 8:00 A M Medical Record Number: [id number] Patient Account Number: [id number] Date of Birth/Sex: Treating RN: 17-Jul-1955 (64 y.o. Katherina Right Primary Care Makailey Hodgkin: Cynda Familia Other Clinician: Referring Kebin Maye: Treating Fredy Gladu/Extender: Willia Craze in Treatment: 4 Active Problems Location of Pain Severity and Description of Pain Patient Has Paino No Site Locations Pain Management and Medication Current Pain Management: Electronic Signature(s) Signed: 02/25/2020 5:56:33 PM By: Cherylin Mylar Signed: 03/01/2020 11:00:36 AM By: Karl Ito Entered By: Karl Ito on 02/25/2020 07:59:59 -------------------------------------------------------------------------------- Patient/Caregiver Education Details Patient Name: Date of Service: [place], [name] 8/27/2021andnbsp8:00 A M Medical Record Number: [id number] Patient Account Number: [id number] Date of Birth/Gender: Treating RN: [birth year] (64 y.o. Katherina Right Primary Care Physician: Cynda Familia Other Clinician: Referring Physician: Treating Physician/Extender: Willia Craze in Treatment: 4 Education Assessment Education Provided To: Patient Education Topics Provided Wound/Skin Impairment: Handouts: Caring for Your Ulcer Methods: Explain/Verbal Responses: State content correctly Electronic Signature(s) Signed: 02/25/2020 5:56:33 PM By: Cherylin Mylar Entered By: Cherylin Mylar on 02/25/2020 07:53:35 -------------------------------------------------------------------------------- Wound Assessment Details Patient Name: Date of Service: [name], [name] 02/25/2020 8:00 A M Medical Record Number: [id number] Patient Account Number: [id number] Date of Birth/Sex: Treating RN: [birth year] (64 y.o. Katherina Right Primary Care Abhi Moccia: Cynda Familia Other Clinician: Referring  Shela Esses: Treating Colter Magowan/Extender: Kathi Simpers  C Weeks in Treatment: 4 Wound Status Wound Number: 4 Primary Diabetic Wound/Ulcer of the Lower Extremity Etiology: Wound Location: Right T Great oe Wound Status: Open Wounding Event: Blister Comorbid Cataracts, Hypertension, Type II Diabetes, Neuropathy, Date Acquired: 12/24/2019 History: Confinement Anxiety Weeks Of Treatment: 4 Clustered Wound: No Photos Photo Uploaded By: Benjaman Kindler on 02/28/2020 10:22:08 Wound Measurements Length: (cm) 0.6 Width: (cm) 0.3 Depth: (cm) 0.6 Area: (cm) 0.141 Volume: (cm) 0.085 % Reduction in Area: 0% % Reduction in Volume: -19.7% Epithelialization: Small (1-33%) Tunneling: No Undermining: Yes Starting Position (o'clock): 12 Ending Position (o'clock): 12 Maximum Distance: (cm) 1 Wound Description Classification: Grade 2 Wound Margin: Thickened Exudate Amount: Small Exudate Type: Serosanguineous Exudate Color: red, brown Foul Odor After Cleansing: No Slough/Fibrino No Wound Bed Granulation Amount: Large (67-100%) Exposed Structure Granulation Quality: Red Fascia Exposed: No Necrotic Amount: None Present (0%) Fat Layer (Subcutaneous Tissue) Exposed: Yes Tendon Exposed: No Muscle Exposed: No Joint Exposed: No Bone Exposed: No Treatment Notes Wound #4 (Right Toe Great) 3. Primary Dressing Applied Calcium Alginate Ag 4. Secondary Dressing Dry Gauze Roll Gauze 7. Footwear/Offloading device applied Felt/Foam Surgical shoe Electronic Signature(s) Signed: 02/25/2020 5:56:33 PM By: Cherylin Mylar Entered By: Cherylin Mylar on 02/25/2020 08:11:42 -------------------------------------------------------------------------------- Vitals Details Patient Name: Date of Service: [name], [name] 02/25/2020 8:00 A M Medical Record Number: [id number] Patient Account Number: [id number] Date of Birth/Sex: Treating RN: 1955/11/01 (64 y.o. Katherina Right Primary Care Jaslyne Beeck: Cynda Familia Other Clinician: Referring Narya Beavin: Treating Andersson Larrabee/Extender: Willia Craze in Treatment: 4 Vital Signs Time Taken: 07:59 Temperature (F): 97.9 Height (in): 70 Pulse (bpm): 75 Weight (lbs): 222 Respiratory Rate (breaths/min): 18 Body Mass Index (BMI): 31.9 Blood Pressure (mmHg): 176/97 Reference Range: 80 - 120 mg / dl Electronic Signature(s) Signed: 03/01/2020 11:00:36 AM By: Karl Ito Entered By: Karl Ito on 02/25/2020 07:59:49

## 2020-03-07 ENCOUNTER — Other Ambulatory Visit: Payer: Self-pay

## 2020-03-07 ENCOUNTER — Encounter (HOSPITAL_BASED_OUTPATIENT_CLINIC_OR_DEPARTMENT_OTHER): Payer: 59 | Attending: Internal Medicine | Admitting: Internal Medicine

## 2020-03-07 DIAGNOSIS — I10 Essential (primary) hypertension: Secondary | ICD-10-CM | POA: Diagnosis not present

## 2020-03-07 DIAGNOSIS — L97512 Non-pressure chronic ulcer of other part of right foot with fat layer exposed: Secondary | ICD-10-CM | POA: Diagnosis present

## 2020-03-07 DIAGNOSIS — Z79899 Other long term (current) drug therapy: Secondary | ICD-10-CM | POA: Insufficient documentation

## 2020-03-07 DIAGNOSIS — E1142 Type 2 diabetes mellitus with diabetic polyneuropathy: Secondary | ICD-10-CM | POA: Diagnosis not present

## 2020-03-07 DIAGNOSIS — E11621 Type 2 diabetes mellitus with foot ulcer: Secondary | ICD-10-CM | POA: Insufficient documentation

## 2020-03-08 NOTE — Progress Notes (Signed)
[name], [name] ([id number]) Visit Report for 03/07/2020 Arrival Information Details Patient Name: Date of Service: [name], [name] 03/07/2020 8:00 A M Medical Record Number: [id number] Patient Account Number: [id number] Date of Birth/Sex: Treating RN: [birth year]Collier (64 y.o. M) Primary Care [name]: [name] Other Clinician: Referring [name]: Treating Analeya Luallen/Extender: [name] in Treatment: 6 Visit Information History Since Last Visit Added or deleted any medications: No Patient Arrived: Ambulatory Any new allergies or adverse reactions: No Arrival Time: 07:51 Had a fall or experienced change in No Accompanied By: self activities of daily living that may affect Transfer Assistance: None risk of falls: Patient Identification Verified: Yes Signs or symptoms of abuse/neglect since last visito No Secondary Verification Process Completed: Yes Hospitalized since last visit: No Patient Requires Transmission-Based Precautions: No Implantable device outside of the clinic excluding No Patient Has Alerts: No cellular tissue based products placed in the center since last visit: Pain Present Now: No Electronic Signature(s) Signed: 03/07/2020 12:03:37 PM By: [name] Entered By: [name] on 03/07/2020 07:52:39 -------------------------------------------------------------------------------- Lower Extremity Assessment Details Patient Name: Date of Service: [place], [name] 03/07/2020 8:00 A M Medical Record Number: [id number] Patient Account Number: [id number] Date of Birth/Sex: Treating RN: [name] (64 y.o. [name]) [name] Primary Care [name]: [name] Other Clinician: Referring Evangelene Vora: Treating Sidrah Harden/Extender: [name] in Treatment: 6 Edema Assessment Assessed: [Left: No] [Right: No] Edema: [Left: N] [Right: o] Calf Left: Right: Point of Measurement: cm From Medial Instep cm 38  cm Ankle Left: Right: Point of Measurement: cm From Medial Instep cm 22 cm Electronic Signature(s) Signed: 03/08/2020 5:14:09 PM By: Todd Pax RN Entered By: [name] on 03/07/2020 08:01:00 -------------------------------------------------------------------------------- Multi Wound Chart Details Patient Name: Date of Service: Todd Price, [name] 03/07/2020 8:00 A M Medical Record Number: [id number] Patient Account Number: [id number] Date of Birth/Sex: Treating RN: [birth year] Collier (64 y.o. M) Primary Care [name]: [name] Other Clinician: Referring [name]: Treating [name]/Extender: [name] in Treatment: 6 Vital Signs Height(in): 70 Pulse(bpm): 76 Weight(lbs): 222 Blood Pressure(mmHg): 175/89 Body Mass Index(BMI): 32 Temperature(F): 98.4 Respiratory Rate(breaths/min): 18 Photos: [4:No Photos Right T Great oe] [N/A:N/A N/A] Wound Location: [4:Blister] [N/A:N/A] Wounding Event: [4:Diabetic Wound/Ulcer of the Lower] [N/A:N/A] Primary Etiology: [4:Extremity Cataracts, Hypertension, Type II] [N/A:N/A] Comorbid History: [4:Diabetes, Neuropathy, Confinement Anxiety 12/24/2019] [N/A:N/A] Date Acquired: [4:6] [N/A:N/A] Weeks of Treatment: [4:Open] [N/A:N/A] Wound Status: [4:0.3x0.6x0.3] [N/A:N/A] Measurements L x W x D (cm) [4:0.141] [N/A:N/A] A (cm) : rea [4:0.042] [N/A:N/A] Volume (cm) : [4:0.00%] [N/A:N/A] % Reduction in A [4:rea: 40.80%] [N/A:N/A] % Reduction in Volume: [4:Grade 2] [N/A:N/A] Classification: [4:Small] [N/A:N/A] Exudate A mount: [4:Serosanguineous] [N/A:N/A] Exudate Type: [4:red, brown] [N/A:N/A] Exudate Color: [4:Thickened] [N/A:N/A] Wound Margin: [4:Large (67-100%)] [N/A:N/A] Granulation A mount: [4:Red] [N/A:N/A] Granulation Quality: [4:None Present (0%)] [N/A:N/A] Necrotic A mount: [4:Fat Layer (Subcutaneous Tissue): Yes N/A] Exposed Structures: [4:Fascia: No Tendon: No Muscle: No Joint: No Bone: No Small  (1-33%)] [N/A:N/A] Epithelialization: [4:Debridement - Excisional] [N/A:N/A] Debridement: Pre-procedure Verification/Time Out 08:07 [N/A:N/A] Taken: [4:Lidocaine 5% topical ointment] [N/A:N/A] Pain Control: [4:Callus, Subcutaneous] [N/A:N/A] Tissue Debrided: [4:Skin/Subcutaneous Tissue] [N/A:N/A] Level: [4:0.18] [N/A:N/A] Debridement A (sq cm): [4:rea Curette] [N/A:N/A] Instrument: [4:Moderate] [N/A:N/A] Bleeding: [4:Pressure] [N/A:N/A] Hemostasis A chieved: [4:0] [N/A:N/A] Procedural Pain: [4:0] [N/A:N/A] Post Procedural Pain: [4:Procedure was tolerated well] [N/A:N/A] Debridement Treatment Response: [4:0.3x0.6x0.3] [N/A:N/A] Post Debridement Measurements L x W x D (cm) [4:0.042] [N/A:N/A] Post Debridement Volume: (cm) [4:Debridement] [N/A:N/A] Treatment Notes Electronic Signature(s) Signed: 03/07/2020 5:12:55 PM  By: Baltazar Najjar MD Entered By: Baltazar Najjar on 03/07/2020 08:20:09 -------------------------------------------------------------------------------- Multi-Disciplinary Care Plan Details Patient Name: Date of Service: [name], [name] 03/07/2020 8:00 A M Medical Record Number: [id number] Patient Account Number: [id number] Date of Birth/Sex: Treating RN: [birth year]Collier (64 y.o. [name]) [name] Primary Care Erion Weightman: [name] Other Clinician: Referring Lesia Monica: Treating Stpehen Petitjean/Extender: [name] in Treatment: 6 Active Inactive Wound/Skin Impairment Nursing Diagnoses: Knowledge deficit related to ulceration/compromised skin integrity Goals: Patient/caregiver will verbalize understanding of skin care regimen Date Initiated: 01/25/2020 Target Resolution Date: 03/17/2020 Goal Status: Active Ulcer/skin breakdown will have a volume reduction of 30% by week 4 Date Initiated: 01/25/2020 Target Resolution Date: 03/17/2020 Goal Status: Active Interventions: Assess patient/caregiver ability to obtain necessary supplies Assess  patient/caregiver ability to perform ulcer/skin care regimen upon admission and as needed Assess ulceration(s) every visit Notes: Electronic Signature(s) Signed: 03/08/2020 5:14:09 PM By: Todd Pax RN Entered By: [name] on 03/07/2020 08:06:33 -------------------------------------------------------------------------------- Pain Assessment Details Patient Name: Date of Service: Todd [place], [name] 03/07/2020 8:00 A M Medical Record Number: [id number] Patient Account Number: [id number] Date of Birth/Sex: Treating RN: [birth year], Collier (64 y.o. M) Primary Care Konstantinos Cordoba: [name] Other Clinician: Referring Kaysin Brock: Treating Charrise Lardner/Extender: [name] in Treatment: 6 Active Problems Location of Pain Severity and Description of Pain Patient Has Paino No Site Locations Pain Management and Medication Current Pain Management: Electronic Signature(s) Signed: 03/07/2020 12:03:37 PM By: [name] Entered By: [name] on 03/07/2020 07:52:59 -------------------------------------------------------------------------------- Patient/Caregiver Education Details Patient Name: Date of Service: [place], [name] 9/7/2021andnbsp8:00 A M Medical Record Number: [id number] Patient Account Number: [id number] Date of Birth/Gender: Treating RN: [birth year] (64 y.o. Melonie [place] Primary Care Physician: [name] Other Clinician: Referring Physician: Treating Physician/Extender: [name] in Treatment: 6 Education Assessment Education Provided To: Patient Education Topics Provided Wound/Skin Impairment: Methods: Explain/Verbal Responses: State content correctly Electronic Signature(s) Signed: 03/08/2020 5:14:09 PM By: Todd Pax RN Entered By: [name] on 03/07/2020 08:06:46 -------------------------------------------------------------------------------- Wound Assessment Details Patient Name: Date of  Service: Todd [place], [name] 03/07/2020 8:00 A M Medical Record Number: [id number] Patient Account Number: [id number] Date of Birth/Sex: Treating RN: [birth year] Collier (64 y.o. M) Primary Care Margurete Guaman: [name] Other Clinician: Referring Darthy Manganelli: Treating Levonia Wolfley/Extender: [name] in Treatment: 6 Wound Status Wound Number: 4 Primary Diabetic Wound/Ulcer of the Lower Extremity Etiology: Wound Location: Right T Great oe Wound Status: Open Wounding Event: Blister Comorbid Cataracts, Hypertension, Type II Diabetes, Neuropathy, Date Acquired: 12/24/2019 History: Confinement Anxiety Weeks Of Treatment: 6 Clustered Wound: No Photos Photo Uploaded By: Benjaman Kindler on 03/08/2020 11:52:40 Wound Measurements Length: (cm) 0.3 Width: (cm) 0.6 Depth: (cm) 0.3 Area: (cm) 0.141 Volume: (cm) 0.042 % Reduction in Area: 0% % Reduction in Volume: 40.8% Epithelialization: Small (1-33%) Tunneling: No Undermining: No Wound Description Classification: Grade 2 Wound Margin: Thickened Exudate Amount: Small Exudate Type: Serosanguineous Exudate Color: red, brown Foul Odor After Cleansing: No Slough/Fibrino No Wound Bed Granulation Amount: Large (67-100%) Exposed Structure Granulation Quality: Red Fascia Exposed: No Necrotic Amount: None Present (0%) Fat Layer (Subcutaneous Tissue) Exposed: Yes Tendon Exposed: No Muscle Exposed: No Joint Exposed: No Bone Exposed: No Electronic Signature(s) Signed: 03/08/2020 5:14:09 PM By: Todd Pax RN Entered By: [name] on 03/07/2020 08:01:39 -------------------------------------------------------------------------------- Vitals Details Patient Name: Date of Service: Todd [place], [name] 03/07/2020 8:00 A M Medical Record Number: [id number] Patient Account Number: [id number] Date of Birth/Sex: Treating RN: 09[birth year]Collier (64  y.o. M) Primary Care Joseph Bias: [name] Other Clinician: Referring  Haisley Arens: Treating Jevaeh Shams/Extender: [name] in Treatment: 6 Vital Signs Time Taken: 07:52 Temperature (F): 98.4 Height (in): 70 Pulse (bpm): 76 Weight (lbs): 222 Respiratory Rate (breaths/min): 18 Body Mass Index (BMI): 31.9 Blood Pressure (mmHg): 175/89 Reference Range: 80 - 120 mg / dl Electronic Signature(s) Signed: 03/07/2020 12:03:37 PM By: [name] Entered By: [name] on 03/07/2020 07:52:54

## 2020-03-08 NOTE — Progress Notes (Signed)
[name], [place] ([id number]) Visit Report for 03/07/2020 Debridement Details Patient Name: Date of Service: [name], [name] 03/07/2020 8:00 A M Medical Record Number: [id number] Patient Account Number: [id number] Date of Birth/Sex: Treating RN: 10-30-1955 (64 y.o. [name]) [name] Primary Care Provider: Cynda Familia Other Clinician: Referring Provider: Treating Provider/Extender: Willia Craze in Treatment: 6 Debridement Performed for Assessment: Wound #4 Right T Great oe Performed By: Physician Maxwell Caul., MD Debridement Type: Debridement Severity of Tissue Pre Debridement: Fat layer exposed Level of Consciousness (Pre-procedure): Awake and Alert Pre-procedure Verification/Time Out Yes - 08:07 Taken: Start Time: 08:07 Pain Control: Lidocaine 5% topical ointment T Area Debrided (L x W): otal 0.3 (cm) x 0.6 (cm) = 0.18 (cm) Tissue and other material debrided: Viable, Non-Viable, Callus, Subcutaneous, Skin: Dermis , Skin: Epidermis Level: Skin/Subcutaneous Tissue Debridement Description: Excisional Instrument: Curette Bleeding: Moderate Hemostasis Achieved: Pressure End Time: 08:12 Procedural Pain: 0 Post Procedural Pain: 0 Response to Treatment: Procedure was tolerated well Level of Consciousness (Post- Awake and Alert procedure): Post Debridement Measurements of Total Wound Length: (cm) 0.3 Width: (cm) 0.6 Depth: (cm) 0.3 Volume: (cm) 0.042 Character of Wound/Ulcer Post Debridement: Improved Severity of Tissue Post Debridement: Fat layer exposed Post Procedure Diagnosis Same as Pre-procedure Electronic Signature(s) Signed: 03/07/2020 5:12:55 PM By: Baltazar Najjar MD Signed: 03/08/2020 5:14:09 PM By: Todd Pax RN Entered By: [name] on 03/07/2020 08:12:24 -------------------------------------------------------------------------------- HPI Details Patient Name: Date of Service: Todd Price, [name] 03/07/2020 8:00 A M Medical Record  Number: [id number] Patient Account Number: [id number] Date of Birth/Sex: Treating RN: 03/24/1956 (64 y.o. M) Primary Care Provider: Cynda Familia Other Clinician: Referring Provider: Treating Provider/Extender: Willia Craze in Treatment: 6 History of Present Illness HPI Description: 12/16/18 on evaluation today patient presents for initial evaluation our clinic as a self-referral due to a wound that is had on his right first toe for 6-8 weeks. He states his last hemoglobin A1c which was roughly 4 months ago was 11.0 he knows he has not been taking care of himself in this regard. He's actually been out of medications for two weeks both in regard to his diabetes as well as his high blood pressure. He states he is going to see those doctors in the next week. Subsequently he actually told me that being here in the clinic and seeing so many patients that did have applications in such has really got him thinking about the fact that he needs to take better care of himself in this regard. Fortunately there's no signs of active infection at this time which is good news. No fevers, chills, nausea, or vomiting noted at this time. He has been seen a local podiatrist for the past 6-8 weeks he was recommending for the patient that he soak in Epsom salts and come in on the patient tells me generally Tuesday and Thursday for debridement's and otherwise was told to leave the wound open to air just put a sock on and go about his business and that "it would scab up and be okay". With that being said the patient states when he realized things were not getting better and he really did not agree with the treatment plan he decided to get a second opinion here the wound care center. Apparently he's had x-rays although the patient's podiatrist has not sent [place] records at this point I think we may want to send them to the hospital for an x-ray just to have everything documented before  we see  about what we need to do from the standpoint of more aggressive treatment such as potentially casting all over this be his right foot that's gonna be somewhat difficult in my pinion. 12/23/18 on evaluation today patient actually appears to be doing okay in regard to his plantar foot ulcer. Unfortunately he was not able to go get the x-ray during the last visit here in the office. Subsequently he states that he actually forgot to go do that in the interim between last week and this week. That is something is gonna go do as soon as he leaves here today. We discussed again that I do think the total contact cast would be the appropriate way to go in the best way to get the news to heal. With that being said beginning the x-ray clear before we proceed with the cast he understands. 6/26; patient here for obligatory total contact cast change no complaints 12/30/18 on evaluation today patient actually appears to be doing excellent in regard to his plantar foot wound. This is looking tremendously better there's new callous buildup it's not as deep and overall show signs of excellent improvement. I'm very pleased in this regard. Overall I think that he should continue with the Current wound care measures including the total contact cast as I believe this is gonna be most helpful in getting this to heal very quickly. 01/06/19 on evaluation today patient actually appears to be doing quite well with regard to his toe ulcer. This is shown signs of good improvement in fact is measuring smaller by about half of what it was last week. Nonetheless I think this is excellent improvement is filled in an overall is making great progress. No fevers, chills, nausea, or vomiting noted at this time. 7/16; patient is here for review of a wound on the plantar aspect of his right great toe. Been using silver collagen under a total contact cast 01/20/19 on evaluation today patient actually presents for a slightly early follow-up due to  the fact that his cast has been rubbing on the right medial ankle region. Unfortunately he has a new wound at this location because of the rubbing of the cast. There does not appear to be signs of active infection which is good news. No fevers, chills, nausea, or vomiting noted at this time. Overall been pleased with how things have progressed in regard to his toe again today this appears to be doing well. 02/01/19-Patient returns at 12 days being out of the cast on account of going to the beach, he has been using Prisma to his wounds on his right medial ankle and right great toe plantar aspect. Both appear to be around the same dimensions. 02/08/19-Patient returns at 1 week after being in a cast both the wounds are better in fact the right great plantar wound is healed, right medial ankle wound looks better. We are using Prisma. Does not need to be in cast anymore 8/24; 2-week follow-up. Everything is healed here including the right great plantar toe and the cast injury on the medial ankle. He still has callus on the plantar right great toe. He has gone to [employer] I believe and has custom inserts at least. His wounds have closed over READMISSION 01/25/2020. This is a 64 year old man with poorly controlled type 2 diabetes with a recent hemoglobin A1c of 9.3. He also has diabetic neuropathy. We had him here extensively in 2020 again for a wound on the plantar aspect of the right great toe.  Eventually we heal this over and a total contact cast. He got custom inserts for his running shoes at [employer]. He has been using them reliably. He states that roughly 3 to 4 weeks ago he developed a thick callus on the bottom of the toe it eventually separated or split and underneath was an open wound. He saw his primary doctor who would and put him on doxycycline for 10 days. Past medical history type 2 diabetes with peripheral neuropathy, hyperlipidemia and hypertension. ABI in our clinic was 1.17 on the  right 02/11/20-Patient back at 2 weeks, right great toe plantar wound is about the same with slight callus build up. 8/27; 2-week follow-up right. Aggressively debrided last time considerable callus buildup. We have been using silver collagen. This is definitely going to require a total contact cast however a week today his daughter gets married we elected to delay that until the Monday after that I E 10 days from now. 9/7; right plantar toe. Aggressively debrided last time and the wound actually looks some better with less depth. 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 In view of this I did not put a total contact cast on. Also noted he is having marked indentation of his right great toe in the surgical shoe he is wearing Electronic Signature(s) Signed: 03/07/2020 5:12:55 PM By: Baltazar Najjar MD Entered By: Baltazar Najjar on 03/07/2020 08:21:33 -------------------------------------------------------------------------------- Physical Exam Details Patient Name: Date of Service: Todd Price, [name] 03/07/2020 8:00 A M Medical Record Number: [id number] Patient Account Number: [id number] Date of Birth/Sex: Treating RN: April 08, 1956 (64 y.o. M) Primary Care Provider: Cynda Familia Other Clinician: Referring Provider: Treating Provider/Extender: Willia Craze in Treatment: 6 Cardiovascular . Notes Wound exam right great toe plantar aspect. I removed callus and debris from the wound surface. This cleans up quite nicely. Not nearly as an aggressive debridement as last time. The depth is now minimal. There is no deep structures palpated. Electronic Signature(s) Signed: 03/07/2020 5:12:55 PM By: Baltazar Najjar MD Entered By: Baltazar Najjar on 03/07/2020 08:22:33 -------------------------------------------------------------------------------- Physician Orders Details Patient Name: Date of  Service: [place], [name] 03/07/2020 8:00 A M Medical Record Number: [id number] Patient Account Number: [id number] Date of Birth/Sex: Treating RN: 04/10/1956 (64 y.o. [name]) [name] Primary Care Provider: Cynda Familia Other Clinician: Referring Provider: Treating Provider/Extender: Willia Craze in Treatment: 6 Verbal / Phone Orders: No Diagnosis Coding ICD-10 Coding Code Description E11.621 Type 2 diabetes mellitus with foot ulcer E11.42 Type 2 diabetes mellitus with diabetic polyneuropathy L97.512 Non-pressure chronic ulcer of other part of right foot with fat layer exposed Follow-up Appointments Return Appointment in 1 week. Dressing Change Frequency Change dressing every day. Wound Cleansing May shower and wash wound with soap and water. Primary Wound Dressing Calcium Alginate with Silver Secondary Dressing Wound #4 Right T Great oe Dry Gauze - secure with tape Other: - felt Off-Loading Wound #4 Right T Great oe Other: - surgical shoe with felt Electronic Signature(s) Signed: 03/07/2020 5:12:55 PM By: Baltazar Najjar MD Signed: 03/08/2020 5:14:09 PM By: Todd Pax RN Entered By: [name] on 03/07/2020 08:13:38 -------------------------------------------------------------------------------- Problem List Details Patient Name: Date of Service: Todd Price, [name] 03/07/2020 8:00 A M Medical Record Number: [id number] Patient Account Number: [id number] Date of Birth/Sex: Treating RN: May 25, 1956 (64 y.o. Melonie [place] Primary Care Provider: Other Clinician: Cynda Familia Referring Provider: Treating Provider/Extender: Leanord Hawking  Donalynn Furlong, Alanson Aly Weeks in Treatment: 6 Active Problems ICD-10 Encounter Code Description Active Date MDM Diagnosis E11.621 Type 2 diabetes mellitus with foot ulcer 01/25/2020 No Yes E11.42 Type 2 diabetes mellitus with diabetic polyneuropathy 01/25/2020 No Yes L97.512 Non-pressure chronic ulcer of other  part of right foot with fat layer exposed 01/25/2020 No Yes Inactive Problems Resolved Problems Electronic Signature(s) Signed: 03/07/2020 5:12:55 PM By: Baltazar Najjar MD Entered By: Baltazar Najjar on 03/07/2020 08:19:52 -------------------------------------------------------------------------------- Progress Note Details Patient Name: Date of Service: Todd Price, [name] 03/07/2020 8:00 A M Medical Record Number: [id number] Patient Account Number: [id number] Date of Birth/Sex: Treating RN: December 01, 1955 (64 y.o. M) Primary Care Provider: Cynda Familia Other Clinician: Referring Provider: Treating Provider/Extender: Willia Craze in Treatment: 6 Subjective History of Present Illness (HPI) 12/16/18 on evaluation today patient presents for initial evaluation our clinic as a self-referral due to a wound that is had on his right first toe for 6-8 weeks. He states his last hemoglobin A1c which was roughly 4 months ago was 11.0 he knows he has not been taking care of himself in this regard. He's actually been out of medications for two weeks both in regard to his diabetes as well as his high blood pressure. He states he is going to see those doctors in the next week. Subsequently he actually told me that being here in the clinic and seeing so many patients that did have applications in such has really got him thinking about the fact that he needs to take better care of himself in this regard. Fortunately there's no signs of active infection at this time which is good news. No fevers, chills, nausea, or vomiting noted at this time. He has been seen a local podiatrist for the past 6-8 weeks he was recommending for the patient that he soak in Epsom salts and come in on the patient tells me generally Tuesday and Thursday for debridement's and otherwise was told to leave the wound open to air just put a sock on and go about his business and that "it would scab up and be okay". With  that being said the patient states when he realized things were not getting better and he really did not agree with the treatment plan he decided to get a second opinion here the wound care center. Apparently he's had x-rays although the patient's podiatrist has not sent [place] records at this point I think we may want to send them to the hospital for an x-ray just to have everything documented before we see about what we need to do from the standpoint of more aggressive treatment such as potentially casting all over this be his right foot that's gonna be somewhat difficult in my pinion. 12/23/18 on evaluation today patient actually appears to be doing okay in regard to his plantar foot ulcer. Unfortunately he was not able to go get the x-ray during the last visit here in the office. Subsequently he states that he actually forgot to go do that in the interim between last week and this week. That is something is gonna go do as soon as he leaves here today. We discussed again that I do think the total contact cast would be the appropriate way to go in the best way to get the news to heal. With that being said beginning the x-ray clear before we proceed with the cast he understands. 6/26; patient here for obligatory total contact cast change no complaints 12/30/18 on evaluation  today patient actually appears to be doing excellent in regard to his plantar foot wound. This is looking tremendously better there's new callous buildup it's not as deep and overall show signs of excellent improvement. I'm very pleased in this regard. Overall I think that he should continue with the Current wound care measures including the total contact cast as I believe this is gonna be most helpful in getting this to heal very quickly. 01/06/19 on evaluation today patient actually appears to be doing quite well with regard to his toe ulcer. This is shown signs of good improvement in fact is measuring smaller by about half of what it was  last week. Nonetheless I think this is excellent improvement is filled in an overall is making great progress. No fevers, chills, nausea, or vomiting noted at this time. 7/16; patient is here for review of a wound on the plantar aspect of his right great toe. Been using silver collagen under a total contact cast 01/20/19 on evaluation today patient actually presents for a slightly early follow-up due to the fact that his cast has been rubbing on the right medial ankle region. Unfortunately he has a new wound at this location because of the rubbing of the cast. There does not appear to be signs of active infection which is good news. No fevers, chills, nausea, or vomiting noted at this time. Overall been pleased with how things have progressed in regard to his toe again today this appears to be doing well. 02/01/19-Patient returns at 12 days being out of the cast on account of going to the beach, he has been using Prisma to his wounds on his right medial ankle and right great toe plantar aspect. Both appear to be around the same dimensions. 02/08/19-Patient returns at 1 week after being in a cast both the wounds are better in fact the right great plantar wound is healed, right medial ankle wound looks better. We are using Prisma. Does not need to be in cast anymore 8/24; 2-week follow-up. Everything is healed here including the right great plantar toe and the cast injury on the medial ankle. He still has callus on the plantar right great toe. He has gone to [employer] I believe and has custom inserts at least. His wounds have closed over READMISSION 01/25/2020. This is a 64 year old man with poorly controlled type 2 diabetes with a recent hemoglobin A1c of 9.3. He also has diabetic neuropathy. We had him here extensively in 2020 again for a wound on the plantar aspect of the right great toe. Eventually we heal this over and a total contact cast. He got custom inserts for his running shoes at [employer]. He has  been using them reliably. He states that roughly 3 to 4 weeks ago he developed a thick callus on the bottom of the toe it eventually separated or split and underneath was an open wound. He saw his primary doctor who would and put him on doxycycline for 10 days. Past medical history type 2 diabetes with peripheral neuropathy, hyperlipidemia and hypertension. ABI in our clinic was 1.17 on the right 02/11/20-Patient back at 2 weeks, right great toe plantar wound is about the same with slight callus build up. 8/27; 2-week follow-up right. Aggressively debrided last time considerable callus buildup. We have been using silver collagen. This is definitely going to require a total contact cast however a week today his daughter gets married we elected to delay that until the Monday after that I E 10 days from  now. 9/7; right plantar toe. Aggressively debrided last time and the wound actually looks some better with less depth. 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 In view of this I did not put a total contact cast on. Also noted he is having marked indentation of his right great toe in the surgical shoe he is wearing Objective Constitutional Vitals Time Taken: 7:52 AM, Height: 70 in, Weight: 222 lbs, BMI: 31.9, Temperature: 98.4 F, Pulse: 76 bpm, Respiratory Rate: 18 breaths/min, Blood Pressure: 175/89 mmHg. General Notes: Wound exam right great toe plantar aspect. I removed callus and debris from the wound surface. This cleans up quite nicely. Not nearly as an aggressive debridement as last time. The depth is now minimal. There is no deep structures palpated. Integumentary (Hair, Skin) Wound #4 status is Open. Original cause of wound was Blister. The wound is located on the Right T Great. The wound measures 0.3cm length x 0.6cm width oe x 0.3cm depth; 0.141cm^2 area and 0.042cm^3 volume. There is Fat Layer (Subcutaneous Tissue)  exposed. There is no tunneling or undermining noted. There is a small amount of serosanguineous drainage noted. The wound margin is thickened. There is large (67-100%) red granulation within the wound bed. There is no necrotic tissue within the wound bed. Assessment Active Problems ICD-10 Type 2 diabetes mellitus with foot ulcer Type 2 diabetes mellitus with diabetic polyneuropathy Non-pressure chronic ulcer of other part of right foot with fat layer exposed Procedures Wound #4 Pre-procedure diagnosis of Wound #4 is a Diabetic Wound/Ulcer of the Lower Extremity located on the Right T Great .Severity of Tissue Pre Debridement is: oe Fat layer exposed. There was a Excisional Skin/Subcutaneous Tissue Debridement with a total area of 0.18 sq cm performed by Maxwell Caulobson, Rashaunda Rahl G., MD. With the following instrument(s): Curette to remove Viable and Non-Viable tissue/material. Material removed includes Callus, Subcutaneous Tissue, Skin: Dermis, and Skin: Epidermis after achieving pain control using Lidocaine 5% topical ointment. No specimens were taken. A time out was conducted at 08:07, prior to the start of the procedure. A Moderate amount of bleeding was controlled with Pressure. The procedure was tolerated well with a pain level of 0 throughout and a pain level of 0 following the procedure. Post Debridement Measurements: 0.3cm length x 0.6cm width x 0.3cm depth; 0.042cm^3 volume. Character of Wound/Ulcer Post Debridement is improved. Severity of Tissue Post Debridement is: Fat layer exposed. Post procedure Diagnosis Wound #4: Same as Pre-Procedure Plan Follow-up Appointments: Return Appointment in 1 week. Dressing Change Frequency: Change dressing every day. Wound Cleansing: May shower and wash wound with soap and water. Primary Wound Dressing: Calcium Alginate with Silver Secondary Dressing: Wound #4 Right T Great: oe Dry Gauze - secure with tape Other: - felt Off-Loading: Wound #4 Right  T Great: oe Other: - surgical shoe with felt #1continue with Salg 692felt offload 3 I didn't think that 2 weeks is enough time for improvement with a TTC Electronic Signature(s) Signed: 03/07/2020 5:12:55 PM By: Baltazar Najjarobson, Trevyon Swor MD Entered By: Baltazar Najjarobson, Jeriann Sayres on 03/07/2020 08:24:12 -------------------------------------------------------------------------------- SuperBill Details Patient Name: Date of Service: Todd PriceUGGINS, [name] 03/07/2020 Medical Record Number: [id number] Patient Account Number: [id number] Date of Birth/Sex: Treating RN: [birth year] (64 y.o. M) Primary Care Provider: Cynda FamiliaMeyers, Stephen C Other Clinician: Referring Provider: Treating Provider/Extender: Willia Crazeobson, Layn Kye Meyers, Stephen C Weeks in Treatment: 6 Diagnosis Coding ICD-10 Codes Code Description [id number] Type 2 diabetes mellitus with foot ulcer E11.42 Type 2  diabetes mellitus with diabetic polyneuropathy L97.512 Non-pressure chronic ulcer of other part of right foot with fat layer exposed Facility Procedures CPT4 Code: [id number] Description: 11042 - DEB SUBQ TISSUE 20 SQ CM/< ICD-10 Diagnosis Description L97.512 Non-pressure chronic ulcer of other part of right foot with fat layer exposed E11.621 Type 2 diabetes mellitus with foot ulcer Modifier: Quantity: 1 Physician Procedures Electronic Signature(s) Signed: 03/07/2020 5:12:55 PM By: Baltazar Najjar MD Entered By: Baltazar Najjar on 03/07/2020 78:29:56

## 2020-03-10 ENCOUNTER — Other Ambulatory Visit: Payer: Self-pay

## 2020-03-10 DIAGNOSIS — E7800 Pure hypercholesterolemia, unspecified: Secondary | ICD-10-CM

## 2020-03-10 MED ORDER — ROSUVASTATIN CALCIUM 40 MG PO TABS
40.0000 mg | ORAL_TABLET | Freq: Every day | ORAL | 3 refills | Status: AC
Start: 2020-03-10 — End: 2020-06-08

## 2020-03-14 ENCOUNTER — Encounter (HOSPITAL_BASED_OUTPATIENT_CLINIC_OR_DEPARTMENT_OTHER): Payer: 59 | Admitting: Internal Medicine

## 2020-03-14 DIAGNOSIS — E11621 Type 2 diabetes mellitus with foot ulcer: Secondary | ICD-10-CM | POA: Diagnosis not present

## 2020-03-20 NOTE — Progress Notes (Signed)
LADON, VANDENBERGHE ([id number]) Visit Report for 03/14/2020 Arrival Information Details Patient Name: Date of Service: [place], [name] 03/14/2020 8:15 A M Medical Record Number: [id number] Patient Account Number: [id number] Date of Birth/Sex: Treating RN: [birth year] (64 y.o. [name] Primary Care Natalee Tomkiewicz: Cynda Familia Other Clinician: Referring Eowyn Tabone: Treating Ilai Hiller/Extender: Willia Craze in Treatment: 7 Visit Information History Since Last Visit Added or deleted any medications: No Patient Arrived: Ambulatory Any new allergies or adverse reactions: No Arrival Time: 08:13 Had a fall or experienced change in No Accompanied By: self activities of daily living that may affect Transfer Assistance: None risk of falls: Patient Identification Verified: Yes Signs or symptoms of abuse/neglect since No Secondary Verification Process Completed: Yes last visito Patient Requires Transmission-Based Precautions: No Hospitalized since last visit: No Patient Has Alerts: No Implantable device outside of the clinic No excluding cellular tissue based products placed in the center since last visit: Has Dressing in Place as Prescribed: Yes Has Footwear/Offloading in Place as Yes Prescribed: Right: Surgical Shoe with Pressure Relief Insole Pain Present Now: No Electronic Signature(s) Signed: 03/14/2020 5:02:37 PM By: Zenaida Deed RN, BSN Entered By: Zenaida Deed on 03/14/2020 08:13:40 -------------------------------------------------------------------------------- Encounter Discharge Information Details Patient Name: Date of Service: [place], [name] 03/14/2020 8:15 A M Medical Record Number: [id number] Patient Account Number: [id number] Date of Birth/Sex: Treating RN: 01/29/1956 (64 y.o. Katherina Right Primary Care Cory Kitt: Cynda Familia Other Clinician: Referring Jansel Vonstein: Treating Kennadee Walthour/Extender: Willia Craze in Treatment: 7 Encounter Discharge Information Items Post Procedure Vitals Discharge Condition: Stable Temperature (F): 98.1 Ambulatory Status: Ambulatory Pulse (bpm): 72 Discharge Destination: Home Respiratory Rate (breaths/min): 18 Transportation: Private Auto Blood Pressure (mmHg): 161/93 Accompanied By: self Schedule Follow-up Appointment: Yes Clinical Summary of Care: Patient Declined Electronic Signature(s) Signed: 03/16/2020 4:34:39 PM By: Cherylin Mylar Entered By: Cherylin Mylar on 03/14/2020 08:59:39 -------------------------------------------------------------------------------- Lower Extremity Assessment Details Patient Name: Date of Service: [place], [name] 03/14/2020 8:15 A M Medical Record Number: [id number] Patient Account Number: [id number] Date of Birth/Sex: Treating RN: 1956/04/20 (64 y.o. [name] Primary Care Korey Prashad: Cynda Familia Other Clinician: Referring Jodette Wik: Treating Spyros Winch/Extender: Willia Craze in Treatment: 7 Edema Assessment Assessed: [Left: No] [Right: No] Edema: [Left: N] [Right: o] Calf Left: Right: Point of Measurement: cm From Medial Instep cm 38 cm Ankle Left: Right: Point of Measurement: cm From Medial Instep cm 22 cm Vascular Assessment Pulses: Dorsalis Pedis Palpable: [Right:Yes] Electronic Signature(s) Signed: 03/14/2020 5:02:37 PM By: Zenaida Deed RN, BSN Entered By: Zenaida Deed on 03/14/2020 08:17:59 -------------------------------------------------------------------------------- Multi Wound Chart Details Patient Name: Date of Service: [place], [name] 03/14/2020 8:15 A M Medical Record Number: [id number] Patient Account Number: [id number] Date of Birth/Sex: Treating RN: 01/20/1956 (64 y.o. Judie Petit) [name] Primary Care Antwian Santaana: Cynda Familia Other Clinician: Referring Emmette Katt: Treating Calina Patrie/Extender: Willia Craze in  Treatment: 7 Vital Signs Height(in): 70 Capillary Blood Glucose(mg/dl): 361 Weight(lbs): 443 Pulse(bpm): 72 Body Mass Index(BMI): 32 Blood Pressure(mmHg): 161/93 Temperature(F): 98.1 Respiratory Rate(breaths/min): 18 Photos: [4:No Photos Right T Great oe] [N/A:N/A N/A] Wound Location: [4:Blister] [N/A:N/A] Wounding Event: [4:Diabetic Wound/Ulcer of the Lower] [N/A:N/A] Primary Etiology: [4:Extremity Cataracts, Hypertension, Type II] [N/A:N/A] Comorbid History: [4:Diabetes, Neuropathy, Confinement Anxiety 12/24/2019] [N/A:N/A] Date Acquired: [4:7] [N/A:N/A] Weeks of Treatment: [4:Open] [N/A:N/A] Wound Status: [4:0.5x0.4x0.3] [N/A:N/A] Measurements L x W x D (cm) [4:0.157] [N/A:N/A] A (cm) : rea [4:0.047] [N/A:N/A] Volume (cm) : [4:-11.30%] [N/A:N/A] % Reduction in  A rea: [4:33.80%] [N/A:N/A] % Reduction in Volume: [4:Grade 2] [N/A:N/A] Classification: [4:Small] [N/A:N/A] Exudate A mount: [4:Serosanguineous] [N/A:N/A] Exudate Type: [4:red, brown] [N/A:N/A] Exudate Color: [4:Epibole] [N/A:N/A] Wound Margin: [4:Large (67-100%)] [N/A:N/A] Granulation A mount: [4:Red] [N/A:N/A] Granulation Quality: [4:None Present (0%)] [N/A:N/A] Necrotic A mount: [4:Fat Layer (Subcutaneous Tissue): Yes N/A] Exposed Structures: [4:Fascia: No Tendon: No Muscle: No Joint: No Bone: No None] [N/A:N/A] Epithelialization: [4:Debridement - Excisional] [N/A:N/A] Debridement: Pre-procedure Verification/Time Out 08:45 [N/A:N/A] Taken: [4:Lidocaine 5% topical ointment] [N/A:N/A] Pain Control: [4:Callus, Subcutaneous] [N/A:N/A] Tissue Debrided: [4:Skin/Subcutaneous Tissue] [N/A:N/A] Level: [4:0.2] [N/A:N/A] Debridement A (sq cm): [4:rea Curette] [N/A:N/A] Instrument: [4:Moderate] [N/A:N/A] Bleeding: [4:Pressure] [N/A:N/A] Hemostasis A chieved: [4:0] [N/A:N/A] Procedural Pain: [4:0] [N/A:N/A] Post Procedural Pain: [4:Procedure was tolerated well] [N/A:N/A] Debridement Treatment Response:  [4:0.5x0.4x0.3] [N/A:N/A] Post Debridement Measurements L x W x D (cm) [4:0.047] [N/A:N/A] Post Debridement Volume: (cm) [4:Debridement] [N/A:N/A] Treatment Notes Wound #4 (Right Toe Great) 1. Cleanse With Wound Cleanser 3. Primary Dressing Applied Collegen AG 4. Secondary Dressing Dry Gauze Roll Gauze 5. Secured With Tape 7. Footwear/Offloading device applied Felt/Foam Electronic Signature(s) Signed: 03/14/2020 5:12:45 PM By: Baltazar Najjar MD Signed: 03/20/2020 1:15:48 PM By: [name] RN Entered By: Baltazar Najjar on 03/14/2020 09:04:29 -------------------------------------------------------------------------------- Multi-Disciplinary Care Plan Details Patient Name: Date of Service: [place], [name] 03/14/2020 8:15 A M Medical Record Number: [id number] Patient Account Number: [id number] Date of Birth/Sex: Treating RN: May 30, 1956 (64 y.o. Melonie [place] Primary Care Lativia Velie: Cynda Familia Other Clinician: Referring Jaiceon Collister: Treating Anelise Staron/Extender: Willia Craze in Treatment: 7 Active Inactive Wound/Skin Impairment Nursing Diagnoses: Knowledge deficit related to ulceration/compromised skin integrity Goals: Patient/caregiver will verbalize understanding of skin care regimen Date Initiated: 01/25/2020 Target Resolution Date: 03/17/2020 Goal Status: Active Ulcer/skin breakdown will have a volume reduction of 30% by week 4 Date Initiated: 01/25/2020 Target Resolution Date: 03/17/2020 Goal Status: Active Interventions: Assess patient/caregiver ability to obtain necessary supplies Assess patient/caregiver ability to perform ulcer/skin care regimen upon admission and as needed Assess ulceration(s) every visit Notes: Electronic Signature(s) Signed: 03/20/2020 1:15:48 PM By: [name] RN Entered By: [name] on 03/14/2020 08:16:22 -------------------------------------------------------------------------------- Pain Assessment  Details Patient Name: Date of Service: [place], [name] 03/14/2020 8:15 A M Medical Record Number: [id number] Patient Account Number: [id number] Date of Birth/Sex: Treating RN: May 17, 1956 (64 y.o. [name] Primary Care Cordai Rodrigue: Cynda Familia Other Clinician: Referring Valeria Krisko: Treating Chenel Wernli/Extender: Willia Craze in Treatment: 7 Active Problems Location of Pain Severity and Description of Pain Patient Has Paino No Site Locations Rate the pain. Current Pain Level: 0 Pain Management and Medication Current Pain Management: Electronic Signature(s) Signed: 03/14/2020 5:02:37 PM By: Zenaida Deed RN, BSN Entered By: Zenaida Deed on 03/14/2020 08:17:27 -------------------------------------------------------------------------------- Patient/Caregiver Education Details Patient Name: Date of Service: [place]rene [place] R[place] 9/14/2021andnbsp8:15 A M Medical Record Number: [id number] Patient Account Number: [id number] Date of Birth/Gender: Treating RN: February 03, 1956 (64 y.o. Melonie [place] Primary Care Physician: Cynda Familia Other Clinician: Referring Physician: Treating Physician/Extender: Willia Craze in Treatment: 7 Education Assessment Education Provided To: Patient Education Topics Provided Wound/Skin Impairment: Methods: Explain/Verbal Responses: State content correctly Electronic Signature(s) Signed: 03/20/2020 1:15:48 PM By: [name] RN Entered By: [name] on 03/14/2020 08:16:41 -------------------------------------------------------------------------------- Wound Assessment Details Patient Name: Date of Service: [place], [place] 03/14/2020 8:15 A M Medical Record Number: [id number] Patient Account Number: [id number] Date of Birth/Sex: Treating RN: [birth year] (64 y.o. [name] Primary Care Markese Bloxham: Cynda Familia Other Clinician:  Referring Odaliz Mcqueary: Treating Fender Herder/Extender:  Willia Craze in Treatment: 7 Wound Status Wound Number: 4 Primary Diabetic Wound/Ulcer of the Lower Extremity Etiology: Wound Location: Right T Great oe Wound Status: Open Wounding Event: Blister Comorbid Cataracts, Hypertension, Type II Diabetes, Neuropathy, Date Acquired: 12/24/2019 History: Confinement Anxiety Weeks Of Treatment: 7 Clustered Wound: No Photos Photo Uploaded By: Benjaman Kindler on 03/16/2020 11:06:23 Wound Measurements Length: (cm) 0.5 Width: (cm) 0.4 Depth: (cm) 0.3 Area: (cm) 0.157 Volume: (cm) 0.047 % Reduction in Area: -11.3% % Reduction in Volume: 33.8% Epithelialization: None Tunneling: No Undermining: No Wound Description Classification: Grade 2 Wound Margin: Epibole Exudate Amount: Small Exudate Type: Serosanguineous Exudate Color: red, brown Foul Odor After Cleansing: No Slough/Fibrino No Wound Bed Granulation Amount: Large (67-100%) Exposed Structure Granulation Quality: Red Fascia Exposed: No Necrotic Amount: None Present (0%) Fat Layer (Subcutaneous Tissue) Exposed: Yes Tendon Exposed: No Muscle Exposed: No Joint Exposed: No Bone Exposed: No Treatment Notes Wound #4 (Right Toe Great) 1. Cleanse With Wound Cleanser 3. Primary Dressing Applied Collegen AG 4. Secondary Dressing Dry Gauze Roll Gauze 5. Secured With Tape 7. Footwear/Offloading device applied Felt/Foam Electronic Signature(s) Signed: 03/14/2020 5:02:37 PM By: Zenaida Deed RN, BSN Entered By: Zenaida Deed on 03/14/2020 08:21:15 -------------------------------------------------------------------------------- Vitals Details Patient Name: Date of Service: [name], [name] 03/14/2020 8:15 A M Medical Record Number: [id number] Patient Account Number: [id number] Date of Birth/Sex: Treating RN: 1956/02/19 (64 y.o. [name] Primary Care Ava Deguire: Cynda Familia Other Clinician: Referring Mylinda Brook: Treating  Toluwani Yadav/Extender: Willia Craze in Treatment: 7 Vital Signs Time Taken: 08:13 Temperature (F): 98.1 Height (in): 70 Pulse (bpm): 72 Source: Stated Respiratory Rate (breaths/min): 18 Weight (lbs): 222 Blood Pressure (mmHg): 161/93 Source: Stated Capillary Blood Glucose (mg/dl): 989 Body Mass Index (BMI): 31.9 Reference Range: 80 - 120 mg / dl Notes glucose per pt report this am Electronic Signature(s) Signed: 03/14/2020 5:02:37 PM By: Zenaida Deed RN, BSN Entered By: Zenaida Deed on 03/14/2020 08:17:19

## 2020-03-20 NOTE — Progress Notes (Deleted)
HPI: FU CP.  Seen in the past at [facility] for PVCs.  Cardiac CTA July 2021 showed calcium score of 43.6 which was 45th percentile and of mild nonobstructive coronary disease including 25 to 49% calcified stenosis in the first diagonal and inferior branch of second diagonal.  Current Outpatient Medications  Medication Sig Dispense Refill  . aspirin EC 81 MG tablet Take 1 tablet (81 mg total) by mouth daily. Swallow whole. 90 tablet 3  . lisinopril (ZESTRIL) 20 MG tablet Take 20 mg by mouth daily.    . metoprolol tartrate (LOPRESSOR) 100 MG tablet TAKE 2 HOURS PRIOR TO CT SCAN 1 tablet 0  . rosuvastatin (CRESTOR) 40 MG tablet Take 1 tablet (40 mg total) by mouth daily. 90 tablet 3  . Semaglutide,0.25 or 0.5MG /DOS, (OZEMPIC, 0.25 OR 0.5 MG/DOSE,) 2 MG/1.5ML SOPN Inject 0.25 mg into the skin once a week.    . sitaGLIPtin-metformin (JANUMET) 50-1000 MG tablet Take 1 tablet by mouth 2 (two) times daily.     No current facility-administered medications for this visit.     Past Medical History:  Diagnosis Date  . Diabetes mellitus without complication (HCC)   . Hypertension     Past Surgical History:  Procedure Laterality Date  . Arthroscopic knee surgery      Social History   Socioeconomic History  . Marital status: Married    Spouse name: Not on file  . Number of children: 4  . Years of education: Not on file  . Highest education level: Not on file  Occupational History  . Not on file  Tobacco Use  . Smoking status: Never Smoker  . Smokeless tobacco: Never Used  . Tobacco comment: expose to passive smoke  Vaping Use  . Vaping Use: Never used  Substance and Sexual Activity  . Alcohol use: Yes    Comment: occasionally  . Drug use: Never  . Sexual activity: Yes  Other Topics Concern  . Not on file  Social History Narrative  . Not on file   Social Determinants of Health   Financial Resource Strain:   . Difficulty of Paying Living Expenses: Not on file  Food  Insecurity:   . Worried About Programme researcher, broadcasting/film/video in the Last Year: Not on file  . Ran Out of Food in the Last Year: Not on file  Transportation Needs:   . Lack of Transportation (Medical): Not on file  . Lack of Transportation (Non-Medical): Not on file  Physical Activity:   . Days of Exercise per Week: Not on file  . Minutes of Exercise per Session: Not on file  Stress:   . Feeling of Stress : Not on file  Social Connections:   . Frequency of Communication with Friends and Family: Not on file  . Frequency of Social Gatherings with Friends and Family: Not on file  . Attends Religious Services: Not on file  . Active Member of Clubs or Organizations: Not on file  . Attends Banker Meetings: Not on file  . Marital Status: Not on file  Intimate Partner Violence:   . Fear of Current or Ex-Partner: Not on file  . Emotionally Abused: Not on file  . Physically Abused: Not on file  . Sexually Abused: Not on file    Family History  Problem Relation Age of Onset  . CAD Father   . CAD Brother     ROS: no fevers or chills, productive cough, hemoptysis, dysphasia, odynophagia, melena,  hematochezia, dysuria, hematuria, rash, seizure activity, orthopnea, PND, pedal edema, claudication. Remaining systems are negative.  Physical Exam: Well-developed well-nourished in no acute distress.  Skin is warm and dry.  HEENT is normal.  Neck is supple.  Chest is clear to auscultation with normal expansion.  Cardiovascular exam is regular rate and rhythm.  Abdominal exam nontender or distended. No masses palpated. Extremities show no edema. neuro grossly intact  ECG- personally reviewed  A/P  1 CAD-plan to treat with aspirin 81 mg daily and add statin.  Coronary disease nonobstructive on CTA.  2 hypertension-patient's blood pressure is controlled.  Continue present medical regimen.  3 hyperlipidemia-add Crestor 40 mg daily.  Check lipids and liver in 12 weeks.  Olga Millers,  MD

## 2020-03-20 NOTE — Progress Notes (Signed)
[name] ([id number]) Visit Report for 03/14/2020 Debridement Details Patient Name: Date of Service: [name], [name] 03/14/2020 8:15 A M Medical Record Number: [id number] Patient Account Number: [id number] Date of Birth/Sex: Treating RN: 17-Jul-1955 (64 y.o. [name]) [name] Primary Care Provider: Cynda Familia Other Clinician: Referring Provider: Treating Provider/Extender: Willia Craze in Treatment: 7 Debridement Performed for Assessment: Wound #4 Right T Great oe Performed By: Physician Maxwell Caul., MD Debridement Type: Debridement Severity of Tissue Pre Debridement: Fat layer exposed Level of Consciousness (Pre-procedure): Awake and Alert Pre-procedure Verification/Time Out Yes - 08:45 Taken: Start Time: 08:45 Pain Control: Lidocaine 5% topical ointment T Area Debrided (L x W): otal 0.5 (cm) x 0.4 (cm) = 0.2 (cm) Tissue and other material debrided: Non-Viable, Callus, Skin: Dermis , Skin: Epidermis Level: Skin/Epidermis Debridement Description: Selective/Open Wound Instrument: Curette Bleeding: Moderate Hemostasis Achieved: Pressure End Time: 08:47 Procedural Pain: 0 Post Procedural Pain: 0 Response to Treatment: Procedure was tolerated well Level of Consciousness (Post- Awake and Alert procedure): Post Debridement Measurements of Total Wound Length: (cm) 0.5 Width: (cm) 0.4 Depth: (cm) 0.3 Volume: (cm) 0.047 Character of Wound/Ulcer Post Debridement: Improved Severity of Tissue Post Debridement: Fat layer exposed Post Procedure Diagnosis Same as Pre-procedure Electronic Signature(s) Signed: 03/14/2020 5:12:45 PM By: Baltazar Najjar MD Signed: 03/20/2020 1:15:48 PM By: [name] RN Entered By: Baltazar Najjar on 03/14/2020 09:04:42 -------------------------------------------------------------------------------- HPI Details Patient Name: Date of Service: [name], [name] 03/14/2020 8:15 A M Medical Record Number:  [id number] Patient Account Number: [id number] Date of Birth/Sex: Treating RN: 27-Jan-1956 (64 y.o. [name] Primary Care Provider: Cynda Familia Other Clinician: Referring Provider: Treating Provider/Extender: Willia Craze in Treatment: 7 History of Present Illness HPI Description: 12/16/18 on evaluation today patient presents for initial evaluation our clinic as a self-referral due to a wound that is had on his right first toe for 6-8 weeks. He states his last hemoglobin A1c which was roughly 4 months ago was 11.0 he knows he has not been taking care of himself in this regard. He's actually been out of medications for two weeks both in regard to his diabetes as well as his high blood pressure. He states he is going to see those doctors in the next week. Subsequently he actually told me that being here in the clinic and seeing so many patients that did have applications in such has really got him thinking about the fact that he needs to take better care of himself in this regard. Fortunately there's no signs of active infection at this time which is good news. No fevers, chills, nausea, or vomiting noted at this time. He has been seen a local podiatrist for the past 6-8 weeks he was recommending for the patient that he soak in Epsom salts and come in on the patient tells me generally Tuesday and Thursday for debridement's and otherwise was told to leave the wound open to air just put a sock on and go about his business and that "it would scab up and be okay". With that being said the patient states when he realized things were not getting better and he really did not agree with the treatment plan he decided to get a second opinion here the wound care center. Apparently he's had x-rays although the patient's podiatrist has not sent [place] records at this point I think we may want to send them to the hospital for an x-ray just to have everything documented before  we see  about what we need to do from the standpoint of more aggressive treatment such as potentially casting all over this be his right foot that's gonna be somewhat difficult in my pinion. 12/23/18 on evaluation today patient actually appears to be doing okay in regard to his plantar foot ulcer. Unfortunately he was not able to go get the x-ray during the last visit here in the office. Subsequently he states that he actually forgot to go do that in the interim between last week and this week. That is something is gonna go do as soon as he leaves here today. We discussed again that I do think the total contact cast would be the appropriate way to go in the best way to get the news to heal. With that being said beginning the x-ray clear before we proceed with the cast he understands. 6/26; patient here for obligatory total contact cast change no complaints 12/30/18 on evaluation today patient actually appears to be doing excellent in regard to his plantar foot wound. This is looking tremendously better there's new callous buildup it's not as deep and overall show signs of excellent improvement. I'm very pleased in this regard. Overall I think that he should continue with the Current wound care measures including the total contact cast as I believe this is gonna be most helpful in getting this to heal very quickly. 01/06/19 on evaluation today patient actually appears to be doing quite well with regard to his toe ulcer. This is shown signs of good improvement in fact is measuring smaller by about half of what it was last week. Nonetheless I think this is excellent improvement is filled in an overall is making great progress. No fevers, chills, nausea, or vomiting noted at this time. 7/16; patient is here for review of a wound on the plantar aspect of his right great toe. Been using silver collagen under a total contact cast 01/20/19 on evaluation today patient actually presents for a slightly early follow-up due to  the fact that his cast has been rubbing on the right medial ankle region. Unfortunately he has a new wound at this location because of the rubbing of the cast. There does not appear to be signs of active infection which is good news. No fevers, chills, nausea, or vomiting noted at this time. Overall been pleased with how things have progressed in regard to his toe again today this appears to be doing well. 02/01/19-Patient returns at 12 days being out of the cast on account of going to the beach, he has been using Prisma to his wounds on his right medial ankle and right great toe plantar aspect. Both appear to be around the same dimensions. 02/08/19-Patient returns at 1 week after being in a cast both the wounds are better in fact the right great plantar wound is healed, right medial ankle wound looks better. We are using Prisma. Does not need to be in cast anymore 8/24; 2-week follow-up. Everything is healed here including the right great plantar toe and the cast injury on the medial ankle. He still has callus on the plantar right great toe. He has gone to [employer] I believe and has custom inserts at least. His wounds have closed over READMISSION 01/25/2020. This is a 64 year old man with poorly controlled type 2 diabetes with a recent hemoglobin A1c of 9.3. He also has diabetic neuropathy. We had him here extensively in 2020 again for a wound on the plantar aspect of the right great toe.  Eventually we heal this over and a total contact cast. He got custom inserts for his running shoes at [employer]. He has been using them reliably. He states that roughly 3 to 4 weeks ago he developed a thick callus on the bottom of the toe it eventually separated or split and underneath was an open wound. He saw his primary doctor who would and put him on doxycycline for 10 days. Past medical history type 2 diabetes with peripheral neuropathy, hyperlipidemia and hypertension. ABI in our clinic was 1.17 on the  right 02/11/20-Patient back at 2 weeks, right great toe plantar wound is about the same with slight callus build up. 8/27; 2-week follow-up right. Aggressively debrided last time considerable callus buildup. We have been using silver collagen. This is definitely going to require a total contact cast however a week today his daughter gets married we elected to delay that until the Monday after that I E 10 days from now. 9/7; right plantar toe. Aggressively debrided last time and the wound actually looks some better with less depth. 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 In view of this I did not put a total contact cast on. Also noted he is having marked indentation of his right great toe in the surgical shoe he is wearing 9/14; right plantar great toe. Surface of the wound looks healthy again he has overriding skin. He is leaving for [place] on vacation on 9/24, after this I think he will require a total contact cast. We started him off on silver collagen for some reason he changed to silver alginate I am going to change him back to silver collagen today Electronic Signature(s) Signed: 03/14/2020 5:12:45 PM By: Baltazar Najjar MD Entered By: Baltazar Najjar on 03/14/2020 09:05:29 -------------------------------------------------------------------------------- Physical Exam Details Patient Name: Date of Service: [place], [name] 03/14/2020 8:15 A M Medical Record Number: [id number] Patient Account Number: [id number] Date of Birth/Sex: Treating RN: 1956-02-05 (64 y.o. [name] Primary Care Provider: Cynda Familia Other Clinician: Referring Provider: Treating Provider/Extender: Willia Craze in Treatment: 7 Constitutional Patient is hypertensive.. Pulse regular and within target range for patient.Marland Kitchen Respirations regular, non-labored and within target range.. Temperature is normal and within  the target range for the patient.Marland Kitchen Appears in no distress. Notes Wound exam; right plantar great toe. I see very little change here. Surface of the wound has depth but otherwise looks healthy with healthy looking granulation. Overhanging skin removed with a #3 curette to also some callus. No bleeding. There is no evidence of infection Electronic Signature(s) Signed: 03/14/2020 5:12:45 PM By: Baltazar Najjar MD Entered By: Baltazar Najjar on 03/14/2020 09:07:28 -------------------------------------------------------------------------------- Physician Orders Details Patient Name: Date of Service: [place], [name] 03/14/2020 8:15 A M Medical Record Number: [id number] Patient Account Number: [id number] Date of Birth/Sex: Treating RN: 1955-11-15 (64 y.o. [name] Primary Care Provider: Cynda Familia Other Clinician: Referring Provider: Treating Provider/Extender: Willia Craze in Treatment: 7 Verbal / Phone Orders: No Diagnosis Coding ICD-10 Coding Code Description E11.621 Type 2 diabetes mellitus with foot ulcer E11.42 Type 2 diabetes mellitus with diabetic polyneuropathy L97.512 Non-pressure chronic ulcer of other part of right foot with fat layer exposed Follow-up Appointments Return Appointment in 1 week. Dressing Change Frequency Change dressing every day. Wound Cleansing May shower and wash wound with soap and water. Primary Wound Dressing Silver Collagen - moisten with normal saline  or KY jelly Secondary Dressing Wound #4 Right T Great oe Dry Gauze - secure with tape Other: - felt Off-Loading Wound #4 Right T Great oe Other: - surgical shoe with felt Electronic Signature(s) Signed: 03/14/2020 5:12:45 PM By: Baltazar Najjar MD Signed: 03/20/2020 1:15:48 PM By: [name] RN Entered By: [name] on 03/14/2020 08:48:47 -------------------------------------------------------------------------------- Problem List Details Patient Name:  Date of Service: [name], [name] 03/14/2020 8:15 A M Medical Record Number: [id number] Patient Account Number: [id number] Date of Birth/Sex: Treating RN: 1956/02/15 (64 y.o. [name] Primary Care Provider: Cynda Familia Other Clinician: Referring Provider: Treating Provider/Extender: Willia Craze in Treatment: 7 Active Problems ICD-10 Encounter Code Description Active Date MDM Diagnosis E11.621 Type 2 diabetes mellitus with foot ulcer 01/25/2020 No Yes E11.42 Type 2 diabetes mellitus with diabetic polyneuropathy 01/25/2020 No Yes L97.512 Non-pressure chronic ulcer of other part of right foot with fat layer exposed 01/25/2020 No Yes Inactive Problems Resolved Problems Electronic Signature(s) Signed: 03/14/2020 5:12:45 PM By: Baltazar Najjar MD Entered By: Baltazar Najjar on 03/14/2020 09:04:00 -------------------------------------------------------------------------------- Progress Note Details Patient Name: Date of Service: [place], [name] 03/14/2020 8:15 A M Medical Record Number: [id number] Patient Account Number: [id number] Date of Birth/Sex: Treating RN: [birth year] (64 y.o. [name] Primary Care Provider: Cynda Familia Other Clinician: Referring Provider: Treating Provider/Extender: Willia Craze in Treatment: 7 Subjective History of Present Illness (HPI) 12/16/18 on evaluation today patient presents for initial evaluation our clinic as a self-referral due to a wound that is had on his right first toe for 6-8 weeks. He states his last hemoglobin A1c which was roughly 4 months ago was 11.0 he knows he has not been taking care of himself in this regard. He's actually been out of medications for two weeks both in regard to his diabetes as well as his high blood pressure. He states he is going to see those doctors in the next week. Subsequently he actually told me that being here in the clinic and seeing so many  patients that did have applications in such has really got him thinking about the fact that he needs to take better care of himself in this regard. Fortunately there's no signs of active infection at this time which is good news. No fevers, chills, nausea, or vomiting noted at this time. He has been seen a local podiatrist for the past 6-8 weeks he was recommending for the patient that he soak in Epsom salts and come in on the patient tells me generally Tuesday and Thursday for debridement's and otherwise was told to leave the wound open to air just put a sock on and go about his business and that "it would scab up and be okay". With that being said the patient states when he realized things were not getting better and he really did not agree with the treatment plan he decided to get a second opinion here the wound care center. Apparently he's had x-rays although the patient's podiatrist has not sent [place] records at this point I think we may want to send them to the hospital for an x-ray just to have everything documented before we see about what we need to do from the standpoint of more aggressive treatment such as potentially casting all over this be his right foot that's gonna be somewhat difficult in my pinion. 12/23/18 on evaluation today patient actually appears to be doing okay in regard to his plantar foot ulcer. Unfortunately he  was not able to go get the x-ray during the last visit here in the office. Subsequently he states that he actually forgot to go do that in the interim between last week and this week. That is something is gonna go do as soon as he leaves here today. We discussed again that I do think the total contact cast would be the appropriate way to go in the best way to get the news to heal. With that being said beginning the x-ray clear before we proceed with the cast he understands. 6/26; patient here for obligatory total contact cast change no complaints 12/30/18 on evaluation  today patient actually appears to be doing excellent in regard to his plantar foot wound. This is looking tremendously better there's new callous buildup it's not as deep and overall show signs of excellent improvement. I'm very pleased in this regard. Overall I think that he should continue with the Current wound care measures including the total contact cast as I believe this is gonna be most helpful in getting this to heal very quickly. 01/06/19 on evaluation today patient actually appears to be doing quite well with regard to his toe ulcer. This is shown signs of good improvement in fact is measuring smaller by about half of what it was last week. Nonetheless I think this is excellent improvement is filled in an overall is making great progress. No fevers, chills, nausea, or vomiting noted at this time. 7/16; patient is here for review of a wound on the plantar aspect of his right great toe. Been using silver collagen under a total contact cast 01/20/19 on evaluation today patient actually presents for a slightly early follow-up due to the fact that his cast has been rubbing on the right medial ankle region. Unfortunately he has a new wound at this location because of the rubbing of the cast. There does not appear to be signs of active infection which is good news. No fevers, chills, nausea, or vomiting noted at this time. Overall been pleased with how things have progressed in regard to his toe again today this appears to be doing well. 02/01/19-Patient returns at 12 days being out of the cast on account of going to the beach, he has been using Prisma to his wounds on his right medial ankle and right great toe plantar aspect. Both appear to be around the same dimensions. 02/08/19-Patient returns at 1 week after being in a cast both the wounds are better in fact the right great plantar wound is healed, right medial ankle wound looks better. We are using Prisma. Does not need to be in cast anymore 8/24;  2-week follow-up. Everything is healed here including the right great plantar toe and the cast injury on the medial ankle. He still has callus on the plantar right great toe. He has gone to [employer] I believe and has custom inserts at least. His wounds have closed over READMISSION 01/25/2020. This is a 64 year old man with poorly controlled type 2 diabetes with a recent hemoglobin A1c of 9.3. He also has diabetic neuropathy. We had him here extensively in 2020 again for a wound on the plantar aspect of the right great toe. Eventually we heal this over and a total contact cast. He got custom inserts for his running shoes at [employer]. He has been using them reliably. He states that roughly 3 to 4 weeks ago he developed a thick callus on the bottom of the toe it eventually separated or split and underneath was  an open wound. He saw his primary doctor who would and put him on doxycycline for 10 days. Past medical history type 2 diabetes with peripheral neuropathy, hyperlipidemia and hypertension. ABI in our clinic was 1.17 on the right 02/11/20-Patient back at 2 weeks, right great toe plantar wound is about the same with slight callus build up. 8/27; 2-week follow-up right. Aggressively debrided last time considerable callus buildup. We have been using silver collagen. This is definitely going to require a total contact cast however a week today his daughter gets married we elected to delay that until the Monday after that I E 10 days from now. 9/7; right plantar toe. Aggressively debrided last time and the wound actually looks some better with less depth. 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 In view of this I did not put a total contact cast on. Also noted he is having marked indentation of his right great toe in the surgical shoe he is wearing 9/14; right plantar great toe. Surface of the wound looks healthy again he has overriding  skin. He is leaving for [place] on vacation on 9/24, after this I think he will require a total contact cast. We started him off on silver collagen for some reason he changed to silver alginate I am going to change him back to silver collagen today Objective Constitutional Patient is hypertensive.. Pulse regular and within target range for patient.Marland Kitchen Respirations regular, non-labored and within target range.. Temperature is normal and within the target range for the patient.Marland Kitchen Appears in no distress. Vitals Time Taken: 8:13 AM, Height: 70 in, Source: Stated, Weight: 222 lbs, Source: Stated, BMI: 31.9, Temperature: 98.1 F, Pulse: 72 bpm, Respiratory Rate: 18 breaths/min, Blood Pressure: 161/93 mmHg, Capillary Blood Glucose: 187 mg/dl. General Notes: glucose per pt report this am General Notes: Wound exam; right plantar great toe. I see very little change here. Surface of the wound has depth but otherwise looks healthy with healthy looking granulation. Overhanging skin removed with a #3 curette to also some callus. No bleeding. There is no evidence of infection Integumentary (Hair, Skin) Wound #4 status is Open. Original cause of wound was Blister. The wound is located on the Right T Great. The wound measures 0.5cm length x 0.4cm width oe x 0.3cm depth; 0.157cm^2 area and 0.047cm^3 volume. There is Fat Layer (Subcutaneous Tissue) exposed. There is no tunneling or undermining noted. There is a small amount of serosanguineous drainage noted. The wound margin is epibole. There is large (67-100%) red granulation within the wound bed. There is no necrotic tissue within the wound bed. Assessment Active Problems ICD-10 Type 2 diabetes mellitus with foot ulcer Type 2 diabetes mellitus with diabetic polyneuropathy Non-pressure chronic ulcer of other part of right foot with fat layer exposed Procedures Wound #4 Pre-procedure diagnosis of Wound #4 is a Diabetic Wound/Ulcer of the Lower Extremity located  on the Right T Great .Severity of Tissue Pre Debridement is: oe Fat layer exposed. There was a Selective/Open Wound Skin/Epidermis Debridement with a total area of 0.2 sq cm performed by Maxwell Caul., MD. With the following instrument(s): Curette to remove Non-Viable tissue/material. Material removed includes Callus, Skin: Dermis, and Skin: Epidermis after achieving pain control using Lidocaine 5% topical ointment. No specimens were taken. A time out was conducted at 08:45, prior to the start of the procedure. A Moderate amount of bleeding was controlled with Pressure. The procedure was tolerated well  with a pain level of 0 throughout and a pain level of 0 following the procedure. Post Debridement Measurements: 0.5cm length x 0.4cm width x 0.3cm depth; 0.047cm^3 volume. Character of Wound/Ulcer Post Debridement is improved. Severity of Tissue Post Debridement is: Fat layer exposed. Post procedure Diagnosis Wound #4: Same as Pre-Procedure Plan Follow-up Appointments: Return Appointment in 1 week. Dressing Change Frequency: Change dressing every day. Wound Cleansing: May shower and wash wound with soap and water. Primary Wound Dressing: Silver Collagen - moisten with normal saline or KY jelly Secondary Dressing: Wound #4 Right T Great: oe Dry Gauze - secure with tape Other: - felt Off-Loading: Wound #4 Right T Great: oe Other: - surgical shoe with felt 1. I changed back to silver collagen hoping to stimulate some granulation 2. I continue to think this is an offloading issue although I do not think there is any reason to put a total contact cast then on a patient who is going to be traveling in 10 days time 3. When he gets back we can offload this with a forefoot off loader or preferably a total contact cast. 4. I see no evidence of infection Electronic Signature(s) Signed: 03/14/2020 5:12:45 PM By: Baltazar Najjarobson, Daylon Lafavor MD Entered By: Baltazar Najjarobson, Lynden Flemmer on 03/14/2020  09:08:27 -------------------------------------------------------------------------------- SuperBill Details Patient Name: Date of Service: Todd PriceUGGINS, [name] 03/14/2020 Medical Record Number: [id number] Patient Account Number: [id number] Date of Birth/Sex: Treating RN: 1956/06/11 (64 y.o. Todd [place]M) Epps, Carrie Primary Care Provider: Cynda FamiliaMeyers, Stephen C Other Clinician: Referring Provider: Treating Provider/Extender: Willia Crazeobson, Renelle Stegenga Meyers, Stephen C Weeks in Treatment: 7 Diagnosis Coding ICD-10 Codes Code Description E11.621 Type 2 diabetes mellitus with foot ulcer E11.42 Type 2 diabetes mellitus with diabetic polyneuropathy L97.512 Non-pressure chronic ulcer of other part of right foot with fat layer exposed Facility Procedures Physician Procedures : CPT4 Code Description Modifier [id number] - WC PHYS DEBR WO ANESTH 20 SQ CM ICD-10 Diagnosis Description E11.621 Type 2 diabetes mellitus with foot ulcer L97.512 Non-pressure chronic ulcer of other part of right foot with fat layer exposed Quantity: 1 Electronic Signature(s) Signed: 03/14/2020 5:12:45 PM By: Baltazar Najjarobson, Jashaun Penrose MD Entered By: Baltazar Najjarobson, Abass Misener on 03/14/2020 09:08:44

## 2020-03-21 ENCOUNTER — Encounter (HOSPITAL_BASED_OUTPATIENT_CLINIC_OR_DEPARTMENT_OTHER): Payer: 59 | Admitting: Internal Medicine

## 2020-03-29 ENCOUNTER — Ambulatory Visit: Payer: 59 | Admitting: Cardiology

## 2020-04-04 ENCOUNTER — Encounter (HOSPITAL_BASED_OUTPATIENT_CLINIC_OR_DEPARTMENT_OTHER): Payer: 59 | Attending: Internal Medicine | Admitting: Internal Medicine

## 2020-04-04 DIAGNOSIS — E1142 Type 2 diabetes mellitus with diabetic polyneuropathy: Secondary | ICD-10-CM | POA: Insufficient documentation

## 2020-04-04 DIAGNOSIS — L97519 Non-pressure chronic ulcer of other part of right foot with unspecified severity: Secondary | ICD-10-CM | POA: Diagnosis present

## 2020-04-04 DIAGNOSIS — L97512 Non-pressure chronic ulcer of other part of right foot with fat layer exposed: Secondary | ICD-10-CM | POA: Insufficient documentation

## 2020-04-04 DIAGNOSIS — E11621 Type 2 diabetes mellitus with foot ulcer: Secondary | ICD-10-CM | POA: Insufficient documentation

## 2020-04-04 NOTE — Progress Notes (Signed)
[place], [place] ([id number]) Visit Report for 04/04/2020 Arrival Information Details Patient Name: Date of Service: [place], [place] 04/04/2020 8:15 A M Medical Record Number: [id number] Patient Account Number: [id number] Date of Birth/Sex: Treating RN: 1955-09-24 (64 y.o. [name]) Dolores Lory, Morey Hummingbird Primary Care Azaela Caracci: Howie Ill Other Clinician: Referring Aliscia Clayton: Treating Aletta Edmunds/Extender: Aldine Contes in Treatment: 10 Visit Information History Since Last Visit Added or deleted any medications: No Patient Arrived: Ambulatory Any new allergies or adverse reactions: No Arrival Time: 08:25 Had a fall or experienced change in No Accompanied By: self activities of daily living that may affect Transfer Assistance: None risk of falls: Patient Identification Verified: Yes Signs or symptoms of abuse/neglect since last visito No Secondary Verification Process Completed: Yes Hospitalized since last visit: No Patient Requires Transmission-Based Precautions: No Implantable device outside of the clinic excluding No Patient Has Alerts: No cellular tissue based products placed in the center since last visit: Has Dressing in Place as Prescribed: Yes Pain Present Now: No Electronic Signature(s) Signed: 04/04/2020 2:32:03 PM By: Sandre Kitty Entered By: Sandre Kitty on 04/04/2020 08:26:06 -------------------------------------------------------------------------------- Encounter Discharge Information Details Patient Name: Date of Service: [place], [place] 04/04/2020 8:15 A M Medical Record Number: [id number] Patient Account Number: [id number] Date of Birth/Sex: Treating RN: Jan 25, 1956 (64 y.o. Marvis Repress Primary Care Auston Halfmann: Howie Ill Other Clinician: Referring Saul Dorsi: Treating Gregg Holster/Extender: Aldine Contes in Treatment: 10 Encounter Discharge Information Items Post Procedure Vitals Discharge Condition:  Stable Temperature (F): 97.8 Ambulatory Status: Ambulatory Pulse (bpm): 72 Discharge Destination: Home Respiratory Rate (breaths/min): 18 Transportation: Private Auto Blood Pressure (mmHg): 182/99 Accompanied By: self Schedule Follow-up Appointment: Yes Clinical Summary of Care: Patient Declined Electronic Signature(s) Signed: 04/04/2020 5:16:50 PM By: Kela Millin Entered By: Kela Millin on 04/04/2020 09:55:15 -------------------------------------------------------------------------------- Lower Extremity Assessment Details Patient Name: Date of Service: [place], [place] 04/04/2020 8:15 A M Medical Record Number: [id number] Patient Account Number: [id number] Date of Birth/Sex: Treating RN: 02-13-1956 (64 y.o. Ernestene Mention Primary Care Kassady Laboy: Howie Ill Other Clinician: Referring Tylisa Alcivar: Treating Lyndal Alamillo/Extender: Aldine Contes in Treatment: 10 Edema Assessment Assessed: Shirlyn Goltz: No] Patrice Paradise: No] Edema: [Left: N] [Right: o] Calf Left: Right: Point of Measurement: From Medial Instep 38 cm Ankle Left: Right: Point of Measurement: From Medial Instep 22 cm Vascular Assessment Pulses: Dorsalis Pedis Palpable: [Right:Yes] Electronic Signature(s) Signed: 04/04/2020 5:49:38 PM By: Baruch Gouty RN, BSN Entered By: Baruch Gouty on 04/04/2020 08:44:58 -------------------------------------------------------------------------------- Multi Wound Chart Details Patient Name: Date of Service: [place], [place] 04/04/2020 8:15 A M Medical Record Number: [id number] Patient Account Number: [id number] Date of Birth/Sex: Treating RN: [birth year] (64 y.o. [name]) Carlene Coria Primary Care Darcy Barbara: Howie Ill Other Clinician: Referring Haizel Gatchell: Treating Cattleya Dobratz/Extender: Aldine Contes in Treatment: 10 Vital Signs Height(in): 70 Capillary Blood Glucose(mg/dl): 182 Weight(lbs): 222 Pulse(bpm): 40 Body  Mass Index(BMI): 32 Blood Pressure(mmHg): 182/99 Temperature(F): 97.8 Respiratory Rate(breaths/min): 18 Photos: [4:No Photos Right T Great oe] [N/A:N/A N/A] Wound Location: [4:Blister] [N/A:N/A] Wounding Event: [4:Diabetic Wound/Ulcer of the Lower] [N/A:N/A] Primary Etiology: [4:Extremity Cataracts, Hypertension, Type II] [N/A:N/A] Comorbid History: [4:Diabetes, Neuropathy, Confinement Anxiety 12/24/2019] [N/A:N/A] Date Acquired: [4:10] [N/A:N/A] Weeks of Treatment: [4:Open] [N/A:N/A] Wound Status: [4:0.3x0.4x0.4] [N/A:N/A] Measurements L x W x D (cm) [4:0.094] [N/A:N/A] A (cm) : rea [4:0.038] [N/A:N/A] Volume (cm) : [4:33.30%] [N/A:N/A] % Reduction in A rea: [4:46.50%] [N/A:N/A] % Reduction in Volume: [4:12] Starting Position 1 (o'clock): [4:12] Ending Position 1 (o'clock): [4:0.4] Maximum  Distance 1 (cm): [4:Yes] [N/A:N/A] Undermining: [4:Grade 2] [N/A:N/A] Classification: [4:Small] [N/A:N/A] Exudate A mount: [4:Serosanguineous] [N/A:N/A] Exudate Type: [4:red, brown] [N/A:N/A] Exudate Color: [4:Well defined, not attached] [N/A:N/A] Wound Margin: [4:Large (67-100%)] [N/A:N/A] Granulation A mount: [4:Red] [N/A:N/A] Granulation Quality: [4:None Present (0%)] [N/A:N/A] Necrotic A mount: [4:Fat Layer (Subcutaneous Tissue): Yes N/A] Exposed Structures: [4:Fascia: No Tendon: No Muscle: No Joint: No Bone: No None] [N/A:N/A] Epithelialization: [4:Debridement - Excisional] [N/A:N/A] Debridement: Pre-procedure Verification/Time Out 09:35 [N/A:N/A] Taken: [4:Subcutaneous, Slough] [N/A:N/A] Tissue Debrided: [4:Skin/Subcutaneous Tissue] [N/A:N/A] Level: [4:0.12] [N/A:N/A] Debridement A (sq cm): [4:rea Curette] [N/A:N/A] Instrument: [4:Moderate] [N/A:N/A] Bleeding: [4:Pressure] [N/A:N/A] Hemostasis A chieved: [4:0] [N/A:N/A] Procedural Pain: [4:0] [N/A:N/A] Post Procedural Pain: [4:Procedure was tolerated well] [N/A:N/A] Debridement Treatment Response: [4:0.3x0.4x0.4]  [N/A:N/A] Post Debridement Measurements L x W x D (cm) [4:0.038] [N/A:N/A] Post Debridement Volume: (cm) [4:Debridement] [N/A:N/A] Treatment Notes Electronic Signature(s) Signed: 04/04/2020 5:35:40 PM By: Carlene Coria RN Signed: 04/04/2020 5:35:49 PM By: Linton Ham MD Entered By: Linton Ham on 04/04/2020 09:48:07 -------------------------------------------------------------------------------- Multi-Disciplinary Care Plan Details Patient Name: Date of Service: [place], [place] 04/04/2020 8:15 A M Medical Record Number: [id number] Patient Account Number: [id number] Date of Birth/Sex: Treating RN: 1955-11-13 (64 y.o. Oval Linsey Primary Care Kairah Leoni: Howie Ill Other Clinician: Referring Sharayah Renfrow: Treating Monee Dembeck/Extender: Aldine Contes in Treatment: 10 Active Inactive Wound/Skin Impairment Nursing Diagnoses: Knowledge deficit related to ulceration/compromised skin integrity Goals: Patient/caregiver will verbalize understanding of skin care regimen Date Initiated: 01/25/2020 Target Resolution Date: 04/17/2020 Goal Status: Active Ulcer/skin breakdown will have a volume reduction of 30% by week 4 Date Initiated: 01/25/2020 Date Inactivated: 04/04/2020 Target Resolution Date: 03/17/2020 Goal Status: Met Ulcer/skin breakdown will have a volume reduction of 50% by week 8 Date Initiated: 04/04/2020 Target Resolution Date: 04/17/2020 Goal Status: Active Interventions: Assess patient/caregiver ability to obtain necessary supplies Assess patient/caregiver ability to perform ulcer/skin care regimen upon admission and as needed Assess ulceration(s) every visit Notes: Electronic Signature(s) Signed: 04/04/2020 5:35:40 PM By: Carlene Coria RN Entered By: Carlene Coria on 04/04/2020 09:06:28 -------------------------------------------------------------------------------- Pain Assessment Details Patient Name: Date of Service: [place], [place]  04/04/2020 8:15 A M Medical Record Number: [id number] Patient Account Number: [id number] Date of Birth/Sex: Treating RN: 1956-03-08 (64 y.o. [name]) Carlene Coria Primary Care Aksh Swart: Howie Ill Other Clinician: Referring Herberto Ledwell: Treating Dorene Bruni/Extender: Aldine Contes in Treatment: 10 Active Problems Location of Pain Severity and Description of Pain Patient Has Paino No Site Locations Pain Management and Medication Current Pain Management: Electronic Signature(s) Signed: 04/04/2020 2:32:03 PM By: Sandre Kitty Signed: 04/04/2020 5:35:40 PM By: Carlene Coria RN Entered By: Sandre Kitty on 04/04/2020 08:26:33 -------------------------------------------------------------------------------- Patient/Caregiver Education Details Patient Name: Date of Service: [place]ung, [place] 10/5/2021andnbsp8:15 A M Medical Record Number: [id number] Patient Account Number: [id number] Date of Birth/Gender: Treating RN: [birth year] (64 y.o. Oval Linsey Primary Care Physician: Howie Ill Other Clinician: Referring Physician: Treating Physician/Extender: Aldine Contes in Treatment: 10 Education Assessment Education Provided To: Patient Education Topics Provided Wound/Skin Impairment: Methods: Explain/Verbal Responses: State content correctly Electronic Signature(s) Signed: 04/04/2020 5:35:40 PM By: Carlene Coria RN Entered By: Carlene Coria on 04/04/2020 09:06:43 -------------------------------------------------------------------------------- Wound Assessment Details Patient Name: Date of Service: [place], [place] 04/04/2020 8:15 A M Medical Record Number: [id number] Patient Account Number: [id number] Date of Birth/Sex: Treating RN: December 29, 1955 (64 y.o. Oval Linsey Primary Care Algenis Ballin: Howie Ill Other Clinician: Referring Cherlynn Popiel: Treating Oleta Gunnoe/Extender: Aldine Contes in Treatment:  10  Wound Status Wound Number: 4 Primary Diabetic Wound/Ulcer of the Lower Extremity Etiology: Wound Location: Right T Great oe Wound Status: Open Wounding Event: Blister Comorbid Cataracts, Hypertension, Type II Diabetes, Neuropathy, Date Acquired: 12/24/2019 History: Confinement Anxiety Weeks Of Treatment: 10 Clustered Wound: No Wound Measurements Length: (cm) 0.3 Width: (cm) 0.4 Depth: (cm) 0.4 Area: (cm) 0.094 Volume: (cm) 0.038 % Reduction in Area: 33.3% % Reduction in Volume: 46.5% Epithelialization: None Tunneling: No Undermining: Yes Starting Position (o'clock): 12 Ending Position (o'clock): 12 Maximum Distance: (cm) 0.4 Wound Description Classification: Grade 2 Wound Margin: Well defined, not attached Exudate Amount: Small Exudate Type: Serosanguineous Exudate Color: red, brown Wound Bed Granulation Amount: Large (67-100%) Granulation Quality: Red Necrotic Amount: None Present (0%) Foul Odor After Cleansing: No Slough/Fibrino No Exposed Structure Fascia Exposed: No Fat Layer (Subcutaneous Tissue) Exposed: Yes Tendon Exposed: No Muscle Exposed: No Joint Exposed: No Bone Exposed: No Treatment Notes Wound #4 (Right Toe Great) 1. Cleanse With Wound Cleanser 3. Primary Dressing Applied Other primary dressing (specifiy in notes) 4. Secondary Dressing Dry Gauze Roll Gauze 5. Secured With Tape 7. Footwear/Offloading device applied Felt/Foam Surgical shoe Notes surgifoam due to bleeding Electronic Signature(s) Signed: 04/04/2020 5:35:40 PM By: Carlene Coria RN Signed: 04/04/2020 5:49:38 PM By: Baruch Gouty RN, BSN Entered By: Baruch Gouty on 04/04/2020 08:45:28 -------------------------------------------------------------------------------- Vitals Details Patient Name: Date of Service: Metro [place]ung, [place] 04/04/2020 8:15 A M Medical Record Number: [id number] Patient Account Number: [id number] Date of Birth/Sex: Treating RN: 04-23-1956 (64 y.o. [name])  Carlene Coria Primary Care Chelcea Zahn: Howie Ill Other Clinician: Referring Acheron Sugg: Treating Xsavier Seeley/Extender: Aldine Contes in Treatment: 10 Vital Signs Time Taken: 08:26 Temperature (F): 97.8 Height (in): 70 Pulse (bpm): 72 Weight (lbs): 222 Respiratory Rate (breaths/min): 18 Body Mass Index (BMI): 31.9 Blood Pressure (mmHg): 182/99 Capillary Blood Glucose (mg/dl): 182 Reference Range: 80 - 120 mg / dl Notes glucose per pt report Electronic Signature(s) Signed: 04/04/2020 5:49:38 PM By: Baruch Gouty RN, BSN Entered By: Baruch Gouty on 04/04/2020 08:45:57

## 2020-04-04 NOTE — Progress Notes (Signed)
[name], [name] ([id number]) Visit Report for 04/04/2020 Debridement Details Patient Name: Date of Service: [name], [place] 04/04/2020 8:15 A M Medical Record Number: [id number] Patient Account Number: [id number] Date of Birth/Sex: Treating RN: 1956-04-03 (64 y.o. [name]) Yevonne PaxEpps, Carrie Primary Care Provider: Cynda FamiliaMeyers, Stephen C Other Clinician: Referring Provider: Treating Provider/Extender: Willia Crazeobson, [name] Meyers, Stephen C Weeks in Treatment: 10 Debridement Performed for Assessment: Wound #4 Right T Great oe Performed By: Physician Maxwell Caulobson, Porche Steinberger G., MD Debridement Type: Debridement Severity of Tissue Pre Debridement: Fat layer exposed Level of Consciousness (Pre-procedure): Awake and Alert Pre-procedure Verification/Time Out Yes - 09:35 Taken: Start Time: 09:35 T Area Debrided (L x W): otal 0.3 (cm) x 0.4 (cm) = 0.12 (cm) Tissue and other material debrided: Viable, Non-Viable, Slough, Subcutaneous, Skin: Dermis , Skin: Epidermis, Slough Level: Skin/Subcutaneous Tissue Debridement Description: Excisional Instrument: Curette Bleeding: Moderate Hemostasis Achieved: Pressure End Time: 09:37 Procedural Pain: 0 Post Procedural Pain: 0 Response to Treatment: Procedure was tolerated well Level of Consciousness (Post- Awake and Alert procedure): Post Debridement Measurements of Total Wound Length: (cm) 0.3 Width: (cm) 0.4 Depth: (cm) 0.4 Volume: (cm) 0.038 Character of Wound/Ulcer Post Debridement: Improved Severity of Tissue Post Debridement: Fat layer exposed Post Procedure Diagnosis Same as Pre-procedure Electronic Signature(s) Signed: 04/04/2020 5:35:40 PM By: Yevonne PaxEpps, Carrie RN Signed: 04/04/2020 5:35:49 PM By: Baltazar Najjarobson, Todd Cuttino MD Entered By: Baltazar Najjarobson, [name] on 04/04/2020 09:48:19 -------------------------------------------------------------------------------- HPI Details Patient Name: Date of Service: [place], [name] 04/04/2020 8:15 A M Medical Record Number: [id number] Patient  Account Number: [id number] Date of Birth/Sex: Treating RN: 1956-04-03 (64 y.o. [name]) Epps, Carrie Primary Care Provider: Cynda FamiliaMeyers, Stephen C Other Clinician: Referring Provider: Treating Provider/Extender: Willia Crazeobson, [name] Meyers, Stephen C Weeks in Treatment: 10 History of Present Illness HPI Description: 12/16/18 on evaluation today patient presents for initial evaluation our clinic as a self-referral due to a wound that is had on his right first toe for 6-8 weeks. He states his last hemoglobin A1c which was roughly 4 months ago was 11.0 he knows he has not been taking care of himself in this regard. He's actually been out of medications for two weeks both in regard to his diabetes as well as his high blood pressure. He states he is going to see those doctors in the next week. Subsequently he actually told me that being here in the clinic and seeing so many patients that did have applications in such has really got him thinking about the fact that he needs to take better care of himself in this regard. Fortunately there's no signs of active infection at this time which is good news. No fevers, chills, nausea, or vomiting noted at this time. He has been seen a local podiatrist for the past 6-8 weeks he was recommending for the patient that he soak in Epsom salts and come in on the patient tells me generally Tuesday and Thursday for debridement's and otherwise was told to leave the wound open to air just put a sock on and go about his business and that "it would scab up and be okay". With that being said the patient states when he realized things were not getting better and he really did not agree with the treatment plan he decided to get a second opinion here the wound care center. Apparently he's had x-rays although the patient's podiatrist has not sent us records at this point I think we may want to send them to the hospital for an x-ray just to have everything documented before we see about  what we  need to do from the standpoint of more aggressive treatment such as potentially casting all over this be his right foot that's gonna be somewhat difficult in my pinion. 12/23/18 on evaluation today patient actually appears to be doing okay in regard to his plantar foot ulcer. Unfortunately he was not able to go get the x-ray during the last visit here in the office. Subsequently he states that he actually forgot to go do that in the interim between last week and this week. That is something is gonna go do as soon as he leaves here today. We discussed again that I do think the total contact cast would be the appropriate way to go in the best way to get the news to heal. With that being said beginning the x-ray clear before we proceed with the cast he understands. 6/26; patient here for obligatory total contact cast change no complaints 12/30/18 on evaluation today patient actually appears to be doing excellent in regard to his plantar foot wound. This is looking tremendously better there's new callous buildup it's not as deep and overall show signs of excellent improvement. I'm very pleased in this regard. Overall I think that he should continue with the Current wound care measures including the total contact cast as I believe this is gonna be most helpful in getting this to heal very quickly. 01/06/19 on evaluation today patient actually appears to be doing quite well with regard to his toe ulcer. This is shown signs of good improvement in fact is measuring smaller by about half of what it was last week. Nonetheless I think this is excellent improvement is filled in an overall is making great progress. No fevers, chills, nausea, or vomiting noted at this time. 7/16; patient is here for review of a wound on the plantar aspect of his right great toe. Been using silver collagen under a total contact cast 01/20/19 on evaluation today patient actually presents for a slightly early follow-up due to the fact that  his cast has been rubbing on the right medial ankle region. Unfortunately he has a new wound at this location because of the rubbing of the cast. There does not appear to be signs of active infection which is good news. No fevers, chills, nausea, or vomiting noted at this time. Overall been pleased with how things have progressed in regard to his toe again today this appears to be doing well. 02/01/19-Patient returns at 12 days being out of the cast on account of going to the beach, he has been using Prisma to his wounds on his right medial ankle and right great toe plantar aspect. Both appear to be around the same dimensions. 02/08/19-Patient returns at 1 week after being in a cast both the wounds are better in fact the right great plantar wound is healed, right medial ankle wound looks better. We are using Prisma. Does not need to be in cast anymore 8/24; 2-week follow-up. Everything is healed here including the right great plantar toe and the cast injury on the medial ankle. He still has callus on the plantar right great toe. He has gone to [employer] I believe and has custom inserts at least. His wounds have closed over READMISSION 01/25/2020. This is a 64 year old man with poorly controlled type 2 diabetes with a recent hemoglobin A1c of 9.3. He also has diabetic neuropathy. We had him here extensively in 2020 again for a wound on the plantar aspect of the right great toe. Eventually we heal  this over and a total contact cast. He got custom inserts for his running shoes at [employer]. He has been using them reliably. He states that roughly 3 to 4 weeks ago he developed a thick callus on the bottom of the toe it eventually separated or split and underneath was an open wound. He saw his primary doctor who would and put him on doxycycline for 10 days. Past medical history type 2 diabetes with peripheral neuropathy, hyperlipidemia and hypertension. ABI in our clinic was 1.17 on the right 02/11/20-Patient  back at 2 weeks, right great toe plantar wound is about the same with slight callus build up. 8/27; 2-week follow-up right. Aggressively debrided last time considerable callus buildup. We have been using silver collagen. This is definitely going to require a total contact cast however a week today his daughter gets married we elected to delay that until the Monday after that I E 10 days from now. 9/7; right plantar toe. Aggressively debrided last time and the wound actually looks some better with less depth. 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 In view of this I did not put a total contact cast on. Also noted he is having marked indentation of his right great toe in the surgical shoe he is wearing 9/14; right plantar great toe. Surface of the wound looks healthy again he has overriding skin. He is leaving for [place] on vacation on 9/24, after this I think he will require a total contact cast. We started him off on silver collagen for some reason he changed to silver alginate I am going to change him back to silver collagen today 10/5; right plantar great toe. He is returned from [place] he was not offloading this at all I am not exactly sure whether he was even dressing this. Arrives with extensive undermining from the wound on the plantar aspect of the right great toe. He has a surgical shoe but nothing else no felt etc. Electronic Signature(s) Signed: 04/04/2020 5:35:49 PM By: Baltazar Najjar MD Entered By: Baltazar Najjar on 04/04/2020 09:49:06 -------------------------------------------------------------------------------- Physical Exam Details Patient Name: Date of Service: [name], [name] 04/04/2020 8:15 A M Medical Record Number: [id number] Patient Account Number: [id number] Date of Birth/Sex: Treating RN: Sep 17, 1955 (64 y.o. [name] Primary Care Provider: Cynda Familia Other Clinician: Referring  Provider: Treating Provider/Extender: Willia Craze in Treatment: 10 Constitutional Patient is hypertensive.. Pulse regular and within target range for patient.Marland Kitchen Respirations regular, non-labored and within target range.. Temperature is normal and within the target range for the patient.Marland Kitchen Appears in no distress. Notes Wound exam; right plantar great toe. No real major change. He still required an extensive debridement of the overlying skin and subcutaneous tissue. Hemostasis with silver nitrate the base of the wound looks exceptionally healthy but he is not closing this down because of excessive pressure. There is no evidence of infection Electronic Signature(s) Signed: 04/04/2020 5:35:49 PM By: Baltazar Najjar MD Entered By: Baltazar Najjar on 04/04/2020 09:50:05 -------------------------------------------------------------------------------- Physician Orders Details Patient Name: Date of Service: [place], [name] 04/04/2020 8:15 A M Medical Record Number: [id number] Patient Account Number: [id number] Date of Birth/Sex: Treating RN: [birth year] (64 y.o. [name] Primary Care Provider: Cynda Familia Other Clinician: Referring Provider: Treating Provider/Extender: Willia Craze in Treatment: 10 Verbal / Phone Orders: No Diagnosis Coding ICD-10 Coding Code Description E11.621 Type 2 diabetes mellitus with  foot ulcer E11.42 Type 2 diabetes mellitus with diabetic polyneuropathy L97.512 Non-pressure chronic ulcer of other part of right foot with fat layer exposed Follow-up Appointments Return Appointment in 1 week. Dressing Change Frequency Change dressing every day. Wound Cleansing May shower and wash wound with soap and water. Primary Wound Dressing Silver Collagen - moisten with normal saline or KY jelly Secondary Dressing Wound #4 Right T Great oe Dry Gauze - secure with tape Other: - felt Off-Loading Wound #4 Right  T Great oe Other: - surgical shoe with felt Electronic Signature(s) Signed: 04/04/2020 5:35:40 PM By: [name] RN Signed: 04/04/2020 5:35:49 PM By: Baltazar Najjar MD Entered By: [name] on 04/04/2020 09:05:54 -------------------------------------------------------------------------------- Problem List Details Patient Name: Date of Service: [place], [name] 04/04/2020 8:15 A M Medical Record Number: [id number] Patient Account Number: [id number] Date of Birth/Sex: Treating RN: [birth year] (64 y.o. [name] Primary Care Provider: Cynda Familia Other Clinician: Referring Provider: Treating Provider/Extender: Willia Craze in Treatment: 10 Active Problems ICD-10 Encounter Code Description Active Date MDM Diagnosis E11.621 Type 2 diabetes mellitus with foot ulcer 01/25/2020 No Yes E11.42 Type 2 diabetes mellitus with diabetic polyneuropathy 01/25/2020 No Yes L97.512 Non-pressure chronic ulcer of other part of right foot with fat layer exposed 01/25/2020 No Yes Inactive Problems Resolved Problems Electronic Signature(s) Signed: 04/04/2020 5:35:49 PM By: Baltazar Najjar MD Entered By: Baltazar Najjar on 04/04/2020 09:47:44 -------------------------------------------------------------------------------- Progress Note Details Patient Name: Date of Service: [name], [name] 04/04/2020 8:15 A M Medical Record Number: [id number] Patient Account Number: [id number] Date of Birth/Sex: Treating RN: [birth year] (64 y.o. [name] Primary Care Provider: Cynda Familia Other Clinician: Referring Provider: Treating Provider/Extender: Willia Craze in Treatment: 10 Subjective History of Present Illness (HPI) 12/16/18 on evaluation today patient presents for initial evaluation our clinic as a self-referral due to a wound that is had on his right first toe for 6-8 weeks. He states his last hemoglobin A1c which was roughly 4 months  ago was 11.0 he knows he has not been taking care of himself in this regard. He's actually been out of medications for two weeks both in regard to his diabetes as well as his high blood pressure. He states he is going to see those doctors in the next week. Subsequently he actually told me that being here in the clinic and seeing so many patients that did have applications in such has really got him thinking about the fact that he needs to take better care of himself in this regard. Fortunately there's no signs of active infection at this time which is good news. No fevers, chills, nausea, or vomiting noted at this time. He has been seen a local podiatrist for the past 6-8 weeks he was recommending for the patient that he soak in Epsom salts and come in on the patient tells me generally Tuesday and Thursday for debridement's and otherwise was told to leave the wound open to air just put a sock on and go about his business and that "it would scab up and be okay". With that being said the patient states when he realized things were not getting better and he really did not agree with the treatment plan he decided to get a second opinion here the wound care center. Apparently he's had x-rays although the patient's podiatrist has not sent [place] records at this point I think we may want to send them to the hospital for an x-ray just  to have everything documented before we see about what we need to do from the standpoint of more aggressive treatment such as potentially casting all over this be his right foot that's gonna be somewhat difficult in my pinion. 12/23/18 on evaluation today patient actually appears to be doing okay in regard to his plantar foot ulcer. Unfortunately he was not able to go get the x-ray during the last visit here in the office. Subsequently he states that he actually forgot to go do that in the interim between last week and this week. That is something is gonna go do as soon as he leaves  here today. We discussed again that I do think the total contact cast would be the appropriate way to go in the best way to get the news to heal. With that being said beginning the x-ray clear before we proceed with the cast he understands. 6/26; patient here for obligatory total contact cast change no complaints 12/30/18 on evaluation today patient actually appears to be doing excellent in regard to his plantar foot wound. This is looking tremendously better there's new callous buildup it's not as deep and overall show signs of excellent improvement. I'm very pleased in this regard. Overall I think that he should continue with the Current wound care measures including the total contact cast as I believe this is gonna be most helpful in getting this to heal very quickly. 01/06/19 on evaluation today patient actually appears to be doing quite well with regard to his toe ulcer. This is shown signs of good improvement in fact is measuring smaller by about half of what it was last week. Nonetheless I think this is excellent improvement is filled in an overall is making great progress. No fevers, chills, nausea, or vomiting noted at this time. 7/16; patient is here for review of a wound on the plantar aspect of his right great toe. Been using silver collagen under a total contact cast 01/20/19 on evaluation today patient actually presents for a slightly early follow-up due to the fact that his cast has been rubbing on the right medial ankle region. Unfortunately he has a new wound at this location because of the rubbing of the cast. There does not appear to be signs of active infection which is good news. No fevers, chills, nausea, or vomiting noted at this time. Overall been pleased with how things have progressed in regard to his toe again today this appears to be doing well. 02/01/19-Patient returns at 12 days being out of the cast on account of going to the beach, he has been using Prisma to his wounds on his  right medial ankle and right great toe plantar aspect. Both appear to be around the same dimensions. 02/08/19-Patient returns at 1 week after being in a cast both the wounds are better in fact the right great plantar wound is healed, right medial ankle wound looks better. We are using Prisma. Does not need to be in cast anymore 8/24; 2-week follow-up. Everything is healed here including the right great plantar toe and the cast injury on the medial ankle. He still has callus on the plantar right great toe. He has gone to [employer] I believe and has custom inserts at least. His wounds have closed over READMISSION 01/25/2020. This is a 64 year old man with poorly controlled type 2 diabetes with a recent hemoglobin A1c of 9.3. He also has diabetic neuropathy. We had him here extensively in 2020 again for a wound on the plantar aspect  of the right great toe. Eventually we heal this over and a total contact cast. He got custom inserts for his running shoes at [employer]. He has been using them reliably. He states that roughly 3 to 4 weeks ago he developed a thick callus on the bottom of the toe it eventually separated or split and underneath was an open wound. He saw his primary doctor who would and put him on doxycycline for 10 days. Past medical history type 2 diabetes with peripheral neuropathy, hyperlipidemia and hypertension. ABI in our clinic was 1.17 on the right 02/11/20-Patient back at 2 weeks, right great toe plantar wound is about the same with slight callus build up. 8/27; 2-week follow-up right. Aggressively debrided last time considerable callus buildup. We have been using silver collagen. This is definitely going to require a total contact cast however a week today his daughter gets married we elected to delay that until the Monday after that I E 10 days from now. 9/7; right plantar toe. Aggressively debrided last time and the wound actually looks some better with less depth. 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 He tells me today that he is going away to [place] for a week on 9/24. In view of this I did not put a total contact cast on. Also noted he is having marked indentation of his right great toe in the surgical shoe he is wearing 9/14; right plantar great toe. Surface of the wound looks healthy again he has overriding skin. He is leaving for [place] on vacation on 9/24, after this I think he will require a total contact cast. We started him off on silver collagen for some reason he changed to silver alginate I am going to change him back to silver collagen today 10/5; right plantar great toe. He is returned from [place] he was not offloading this at all I am not exactly sure whether he was even dressing this. Arrives with extensive undermining from the wound on the plantar aspect of the right great toe. He has a surgical shoe but nothing else no felt etc. Objective Constitutional Patient is hypertensive.. Pulse regular and within target range for patient.Marland Kitchen Respirations regular, non-labored and within target range.. Temperature is normal and within the target range for the patient.Marland Kitchen Appears in no distress. Vitals Time Taken: 8:26 AM, Height: 70 in, Weight: 222 lbs, BMI: 31.9, Temperature: 97.8 F, Pulse: 72 bpm, Respiratory Rate: 18 breaths/min, Blood Pressure: 182/99 mmHg, Capillary Blood Glucose: 182 mg/dl. General Notes: glucose per pt report General Notes: Wound exam; right plantar great toe. No real major change. He still required an extensive debridement of the overlying skin and subcutaneous tissue. Hemostasis with silver nitrate the base of the wound looks exceptionally healthy but he is not closing this down because of excessive pressure. There is no evidence of infection Integumentary (Hair, Skin) Wound #4 status is Open. Original cause of wound was Blister. The wound is located on the Right T Great. The wound measures 0.3cm length x 0.4cm width oe x 0.4cm depth;  0.094cm^2 area and 0.038cm^3 volume. There is Fat Layer (Subcutaneous Tissue) exposed. There is no tunneling noted, however, there is undermining starting at 12:00 and ending at 12:00 with a maximum distance of 0.4cm. There is a small amount of serosanguineous drainage noted. The wound margin is well defined and not attached to the wound base. There is large (67-100%) red granulation within the wound bed. There is no necrotic tissue within the wound bed. Assessment Active Problems ICD-10 Type  2 diabetes mellitus with foot ulcer Type 2 diabetes mellitus with diabetic polyneuropathy Non-pressure chronic ulcer of other part of right foot with fat layer exposed Procedures Wound #4 Pre-procedure diagnosis of Wound #4 is a Diabetic Wound/Ulcer of the Lower Extremity located on the Right T Great .Severity of Tissue Pre Debridement is: oe Fat layer exposed. There was a Excisional Skin/Subcutaneous Tissue Debridement with a total area of 0.12 sq cm performed by Maxwell Caul., MD. With the following instrument(s): Curette to remove Viable and Non-Viable tissue/material. Material removed includes Subcutaneous Tissue, Slough, Skin: Dermis, and Skin: Epidermis. No specimens were taken. A time out was conducted at 09:35, prior to the start of the procedure. A Moderate amount of bleeding was controlled with Pressure. The procedure was tolerated well with a pain level of 0 throughout and a pain level of 0 following the procedure. Post Debridement Measurements: 0.3cm length x 0.4cm width x 0.4cm depth; 0.038cm^3 volume. Character of Wound/Ulcer Post Debridement is improved. Severity of Tissue Post Debridement is: Fat layer exposed. Post procedure Diagnosis Wound #4: Same as Pre-Procedure Plan Follow-up Appointments: Return Appointment in 1 week. Dressing Change Frequency: Change dressing every day. Wound Cleansing: May shower and wash wound with soap and water. Primary Wound Dressing: Silver  Collagen - moisten with normal saline or KY jelly Secondary Dressing: Wound #4 Right T Great: oe Dry Gauze - secure with tape Other: - felt Off-Loading: Wound #4 Right T Great: oe Other: - surgical shoe with felt 1. Continue silver collagen with felt 2. He is wearing his surgical shoe 3. We cannot put a total contact cast on because the cast removed. Is broken. Electronic Signature(s) Signed: 04/04/2020 5:35:49 PM By: Baltazar Najjar MD Entered By: Baltazar Najjar on 04/04/2020 09:50:35 -------------------------------------------------------------------------------- SuperBill Details Patient Name: Date of Service: [place], [name] Collier 04/04/2020 Medical Record Number: [id number] Patient Account Number: [id number] Date of Birth/Sex: Treating RN: Apr 10, 1956 (64 y.o. 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

## 2020-04-11 ENCOUNTER — Encounter (HOSPITAL_BASED_OUTPATIENT_CLINIC_OR_DEPARTMENT_OTHER): Payer: 59 | Admitting: Internal Medicine

## 2020-04-11 DIAGNOSIS — E11621 Type 2 diabetes mellitus with foot ulcer: Secondary | ICD-10-CM | POA: Diagnosis not present

## 2020-04-11 NOTE — Progress Notes (Signed)
[name], [name] ([id number]) Visit Report for 04/11/2020 HPI Details Patient Name: Date of Service: [name], [name] Collier 04/11/2020 8:15 A M Medical Record Number: [id number] Patient Account Number: [id number] Date of Birth/Sex: Treating RN: 10-25-1955 (64 y.o. [name]) [name] Primary Care Provider: Cynda Familia Other Clinician: Referring Provider: Treating Provider/Extender: Willia Craze in Treatment: 11 History of Present Illness HPI Description: 12/16/18 on evaluation today patient presents for initial evaluation our clinic as a self-referral due to a wound that is had on his right first toe for 6-8 weeks. He states his last hemoglobin A1c which was roughly 4 months ago was 11.0 he knows he has not been taking care of himself in this regard. He's actually been out of medications for two weeks both in regard to his diabetes as well as his high blood pressure. He states he is going to see those doctors in the next week. Subsequently he actually told me that being here in the clinic and seeing so many patients that did have applications in such has really got him thinking about the fact that he needs to take better care of himself in this regard. Fortunately there's no signs of active infection at this time which is good news. No fevers, chills, nausea, or vomiting noted at this time. He has been seen a local podiatrist for the past 6-8 weeks he was recommending for the patient that he soak in Epsom salts and come in on the patient tells me generally Tuesday and Thursday for debridement's and otherwise was told to leave the wound open to air just put a sock on and go about his business and that "it would scab up and be okay". With that being said the patient states when he realized things were not getting better and he really did not agree with the treatment plan he decided to get a second opinion here the wound care center. Apparently he's had x-rays although the patient's  podiatrist has not sent [place] records at this point I think we may want to send them to the hospital for an x-ray just to have everything documented before we see about what we need to do from the standpoint of more aggressive treatment such as potentially casting all over this be his right foot that's gonna be somewhat difficult in my pinion. 12/23/18 on evaluation today patient actually appears to be doing okay in regard to his plantar foot ulcer. Unfortunately he was not able to go get the x-ray during the last visit here in the office. Subsequently he states that he actually forgot to go do that in the interim between last week and this week. That is something is gonna go do as soon as he leaves here today. We discussed again that I do think the total contact cast would be the appropriate way to go in the best way to get the news to heal. With that being said beginning the x-ray clear before we proceed with the cast he understands. 6/26; patient here for obligatory total contact cast change no complaints 12/30/18 on evaluation today patient actually appears to be doing excellent in regard to his plantar foot wound. This is looking tremendously better there's new callous buildup it's not as deep and overall show signs of excellent improvement. I'm very pleased in this regard. Overall I think that he should continue with the Current wound care measures including the total contact cast as I believe this is gonna be most helpful in getting this to  heal very quickly. 01/06/19 on evaluation today patient actually appears to be doing quite well with regard to his toe ulcer. This is shown signs of good improvement in fact is measuring smaller by about half of what it was last week. Nonetheless I think this is excellent improvement is filled in an overall is making great progress. No fevers, chills, nausea, or vomiting noted at this time. 7/16; patient is here for review of a wound on the plantar aspect of his  right great toe. Been using silver collagen under a total contact cast 01/20/19 on evaluation today patient actually presents for a slightly early follow-up due to the fact that his cast has been rubbing on the right medial ankle region. Unfortunately he has a new wound at this location because of the rubbing of the cast. There does not appear to be signs of active infection which is good news. No fevers, chills, nausea, or vomiting noted at this time. Overall been pleased with how things have progressed in regard to his toe again today this appears to be doing well. 02/01/19-Patient returns at 12 days being out of the cast on account of going to the beach, he has been using Prisma to his wounds on his right medial ankle and right great toe plantar aspect. Both appear to be around the same dimensions. 02/08/19-Patient returns at 1 week after being in a cast both the wounds are better in fact the right great plantar wound is healed, right medial ankle wound looks better. We are using Prisma. Does not need to be in cast anymore 8/24; 2-week follow-up. Everything is healed here including the right great plantar toe and the cast injury on the medial ankle. He still has callus on the plantar right great toe. He has gone to [employer] I believe and has custom inserts at least. His wounds have closed over READMISSION 01/25/2020. This is a 64 year old man with poorly controlled type 2 diabetes with a recent hemoglobin A1c of 9.3. He also has diabetic neuropathy. We had him here extensively in 2020 again for a wound on the plantar aspect of the right great toe. Eventually we heal this over and a total contact cast. He got custom inserts for his running shoes at [employer]. He has been using them reliably. He states that roughly 3 to 4 weeks ago he developed a thick callus on the bottom of the toe it eventually separated or split and underneath was an open wound. He saw his primary doctor who would and put him on  doxycycline for 10 days. Past medical history type 2 diabetes with peripheral neuropathy, hyperlipidemia and hypertension. ABI in our clinic was 1.17 on the right 02/11/20-Patient back at 2 weeks, right great toe plantar wound is about the same with slight callus build up. 8/27; 2-week follow-up right. Aggressively debrided last time considerable callus buildup. We have been using silver collagen. This is definitely going to require a total contact cast however a week today his daughter gets married we elected to delay that until the Monday after that I E 10 days from now. 9/7; right plantar toe. Aggressively debrided last time and the wound actually looks some better with less depth. 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 In view of this I did not put a total contact cast on. Also noted he is having marked indentation of his right great toe in the surgical shoe he is wearing  9/14; right plantar great toe. Surface of the wound looks healthy again he has overriding skin. He is leaving for [place] on vacation on 9/24, after this I think he will require a total contact cast. We started him off on silver collagen for some reason he changed to silver alginate I am going to change him back to silver collagen today 10/5; right plantar great toe. He is returned from [place] he was not offloading this at all I am not exactly sure whether he was even dressing this. Arrives with extensive undermining from the wound on the plantar aspect of the right great toe. He has a surgical shoe but nothing else no felt etc. 10/12; right plantar great toe. The wound does not look unhealthy however its has thick edges overhanging skin. He is ready for a total contact cast. Still using silver collagen as the primary dressing Electronic Signature(s) Signed: 04/11/2020 5:41:11 PM By: Baltazar Najjar [name] Entered By: Baltazar Najjar on 04/11/2020  09:05:58 -------------------------------------------------------------------------------- Physical Exam Details Patient Name: Date of Service: Todd Price, [name] 04/11/2020 8:15 A M Medical Record Number: [id number] Patient Account Number: [id number] Date of Birth/Sex: Treating RN: [birth year] (64 y.o. [name] Primary Care Provider: Cynda Familia Other Clinician: Referring Provider: Treating Provider/Extender: Willia Craze in Treatment: 11 Constitutional Patient is hypertensive.. Pulse regular and within target range for patient.Marland Kitchen Respirations regular, non-labored and within target range.. Temperature is normal and within the target range for the patient.Marland Kitchen Appears in no distress. Notes Wound exam; right plantar great toe. No major change. Surface of the wound looks healthy although there is thick edges and some overhanging skin. I did not debride this today I am going to use silver collagen and put him under a total contact cast I think this is largely an offloading issue. I see no evidence of infection Electronic Signature(s) Signed: 04/11/2020 5:41:11 PM By: Baltazar Najjar [name] Entered By: Baltazar Najjar on 04/11/2020 09:07:28 -------------------------------------------------------------------------------- Physician Orders Details Patient Name: Date of Service: [place], [name] 04/11/2020 8:15 A M Medical Record Number: [id number] Patient Account Number: [id number] Date of Birth/Sex: Treating RN: 1955-12-05 (64 y.o. [name] Primary Care Provider: Cynda Familia Other Clinician: Referring Provider: Treating Provider/Extender: Willia Craze in Treatment: 11 Verbal / Phone Orders: No Diagnosis Coding ICD-10 Coding Code Description E11.621 Type 2 diabetes mellitus with foot ulcer E11.42 Type 2 diabetes mellitus with diabetic polyneuropathy L97.512 Non-pressure chronic ulcer of other part of right foot with fat  layer exposed Follow-up Appointments Other: - Thursday or Friday , then in 1 week Dressing Change Frequency Other: - thursday or friday, then in 1 week Wound Cleansing May shower with protection. Primary Wound Dressing Silver Collagen - moisten with normal saline or KY jelly Secondary Dressing Wound #4 Right T Great oe Foam - foam donut Dry Gauze Off-Loading Wound #4 Right T Great oe Total Contact Cast to Right Lower Extremity Removable cast walker boot to: - right lower leg Electronic Signature(s) Signed: 04/11/2020 5:41:11 PM By: Baltazar Najjar [name] Signed: 04/11/2020 5:49:00 PM By: Todd Pax RN Entered By: [name] on 04/11/2020 09:05:28 -------------------------------------------------------------------------------- Problem List Details Patient Name: Date of Service: [place], [name] 04/11/2020 8:15 A M Medical Record Number: [id number] Patient Account Number: [id number] Date of Birth/Sex: Treating RN: 11-28-1955 (64 y.o. [name] Primary Care Provider: Cynda Familia Other Clinician: Referring Provider: Treating Provider/Extender: Willia Craze in Treatment: 11 Active Problems ICD-10 Encounter Code  Description Active Date MDM Diagnosis E11.621 Type 2 diabetes mellitus with foot ulcer 01/25/2020 No Yes E11.42 Type 2 diabetes mellitus with diabetic polyneuropathy 01/25/2020 No Yes L97.512 Non-pressure chronic ulcer of other part of right foot with fat layer exposed 01/25/2020 No Yes Inactive Problems Resolved Problems Electronic Signature(s) Signed: 04/11/2020 5:41:11 PM By: Baltazar Najjar [name] Entered By: Baltazar Najjar on 04/11/2020 09:04:45 -------------------------------------------------------------------------------- Progress Note Details Patient Name: Date of Service: Todd Price, [name] 04/11/2020 8:15 A M Medical Record Number: [id number] Patient Account Number: [id number] Date of Birth/Sex: Treating RN: 08-23-1955 (64  y.o. [name] Primary Care Provider: Cynda Familia Other Clinician: Referring Provider: Treating Provider/Extender: Willia Craze in Treatment: 11 Subjective History of Present Illness (HPI) 12/16/18 on evaluation today patient presents for initial evaluation our clinic as a self-referral due to a wound that is had on his right first toe for 6-8 weeks. He states his last hemoglobin A1c which was roughly 4 months ago was 11.0 he knows he has not been taking care of himself in this regard. He's actually been out of medications for two weeks both in regard to his diabetes as well as his high blood pressure. He states he is going to see those doctors in the next week. Subsequently he actually told me that being here in the clinic and seeing so many patients that did have applications in such has really got him thinking about the fact that he needs to take better care of himself in this regard. Fortunately there's no signs of active infection at this time which is good news. No fevers, chills, nausea, or vomiting noted at this time. He has been seen a local podiatrist for the past 6-8 weeks he was recommending for the patient that he soak in Epsom salts and come in on the patient tells me generally Tuesday and Thursday for debridement's and otherwise was told to leave the wound open to air just put a sock on and go about his business and that "it would scab up and be okay". With that being said the patient states when he realized things were not getting better and he really did not agree with the treatment plan he decided to get a second opinion here the wound care center. Apparently he's had x-rays although the patient's podiatrist has not sent [place] records at this point I think we may want to send them to the hospital for an x-ray just to have everything documented before we see about what we need to do from the standpoint of more aggressive treatment such as  potentially casting all over this be his right foot that's gonna be somewhat difficult in my pinion. 12/23/18 on evaluation today patient actually appears to be doing okay in regard to his plantar foot ulcer. Unfortunately he was not able to go get the x-ray during the last visit here in the office. Subsequently he states that he actually forgot to go do that in the interim between last week and this week. That is something is gonna go do as soon as he leaves here today. We discussed again that I do think the total contact cast would be the appropriate way to go in the best way to get the news to heal. With that being said beginning the x-ray clear before we proceed with the cast he understands. 6/26; patient here for obligatory total contact cast change no complaints 12/30/18 on evaluation today patient actually appears to be doing excellent in regard to  his plantar foot wound. This is looking tremendously better there's new callous buildup it's not as deep and overall show signs of excellent improvement. I'm very pleased in this regard. Overall I think that he should continue with the Current wound care measures including the total contact cast as I believe this is gonna be most helpful in getting this to heal very quickly. 01/06/19 on evaluation today patient actually appears to be doing quite well with regard to his toe ulcer. This is shown signs of good improvement in fact is measuring smaller by about half of what it was last week. Nonetheless I think this is excellent improvement is filled in an overall is making great progress. No fevers, chills, nausea, or vomiting noted at this time. 7/16; patient is here for review of a wound on the plantar aspect of his right great toe. Been using silver collagen under a total contact cast 01/20/19 on evaluation today patient actually presents for a slightly early follow-up due to the fact that his cast has been rubbing on the right medial ankle region.  Unfortunately he has a new wound at this location because of the rubbing of the cast. There does not appear to be signs of active infection which is good news. No fevers, chills, nausea, or vomiting noted at this time. Overall been pleased with how things have progressed in regard to his toe again today this appears to be doing well. 02/01/19-Patient returns at 12 days being out of the cast on account of going to the beach, he has been using Prisma to his wounds on his right medial ankle and right great toe plantar aspect. Both appear to be around the same dimensions. 02/08/19-Patient returns at 1 week after being in a cast both the wounds are better in fact the right great plantar wound is healed, right medial ankle wound looks better. We are using Prisma. Does not need to be in cast anymore 8/24; 2-week follow-up. Everything is healed here including the right great plantar toe and the cast injury on the medial ankle. He still has callus on the plantar right great toe. He has gone to [employer] I believe and has custom inserts at least. His wounds have closed over READMISSION 01/25/2020. This is a 64 year old man with poorly controlled type 2 diabetes with a recent hemoglobin A1c of 9.3. He also has diabetic neuropathy. We had him here extensively in 2020 again for a wound on the plantar aspect of the right great toe. Eventually we heal this over and a total contact cast. He got custom inserts for his running shoes at [employer]. He has been using them reliably. He states that roughly 3 to 4 weeks ago he developed a thick callus on the bottom of the toe it eventually separated or split and underneath was an open wound. He saw his primary doctor who would and put him on doxycycline for 10 days. Past medical history type 2 diabetes with peripheral neuropathy, hyperlipidemia and hypertension. ABI in our clinic was 1.17 on the right 02/11/20-Patient back at 2 weeks, right great toe plantar wound is about the  same with slight callus build up. 8/27; 2-week follow-up right. Aggressively debrided last time considerable callus buildup. We have been using silver collagen. This is definitely going to require a total contact cast however a week today his daughter gets married we elected to delay that until the Monday after that I E 10 days from now. 9/7; right plantar toe. Aggressively debrided last time and the  wound actually looks some better with less depth. 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 In view of this I did not put a total contact cast on. Also noted he is having marked indentation of his right great toe in the surgical shoe he is wearing 9/14; right plantar great toe. Surface of the wound looks healthy again he has overriding skin. He is leaving for [place] on vacation on 9/24, after this I think he will require a total contact cast. We started him off on silver collagen for some reason he changed to silver alginate I am going to change him back to silver collagen today 10/5; right plantar great toe. He is returned from [place] he was not offloading this at all I am not exactly sure whether he was even dressing this. Arrives with extensive undermining from the wound on the plantar aspect of the right great toe. He has a surgical shoe but nothing else no felt etc. 10/12; right plantar great toe. The wound does not look unhealthy however its has thick edges overhanging skin. He is ready for a total contact cast. Still using silver collagen as the primary dressing Objective Constitutional Patient is hypertensive.. Pulse regular and within target range for patient.Marland Kitchen. Respirations regular, non-labored and within target range.. Temperature is normal and within the target range for the patient.Marland Kitchen. Appears in no distress. Vitals Time Taken: 8:32 AM, Height: 70 in, Weight: 222 lbs, BMI: 31.9, Temperature: 97.9 F, Pulse: 73 bpm,  Respiratory Rate: 18 breaths/min, Blood Pressure: 178/97 mmHg, Capillary Blood Glucose: 186 mg/dl. General Notes: glucose per pt report yesterday General Notes: Wound exam; right plantar great toe. No major change. Surface of the wound looks healthy although there is thick edges and some overhanging skin. I did not debride this today I am going to use silver collagen and put him under a total contact cast I think this is largely an offloading issue. I see no evidence of infection Integumentary (Hair, Skin) Wound #4 status is Open. Original cause of wound was Blister. The wound is located on the Right T Great. The wound measures 0.7cm length x 0.6cm width oe x 0.3cm depth; 0.33cm^2 area and 0.099cm^3 volume. There is Fat Layer (Subcutaneous Tissue) exposed. There is no tunneling noted, however, there is undermining starting at 12:00 and ending at 6:00 with a maximum distance of 0.2cm. There is a small amount of serosanguineous drainage noted. The wound margin is well defined and not attached to the wound base. There is large (67-100%) red, pale granulation within the wound bed. There is a small (1-33%) amount of necrotic tissue within the wound bed including Adherent Slough. Assessment Active Problems ICD-10 Type 2 diabetes mellitus with foot ulcer Type 2 diabetes mellitus with diabetic polyneuropathy Non-pressure chronic ulcer of other part of right foot with fat layer exposed Procedures Wound #4 Pre-procedure diagnosis of Wound #4 is a Diabetic Wound/Ulcer of the Lower Extremity located on the Right T Great . There was a T Contact Cast oe otal Procedure by Todd Caulobson, Lorelee Mclaurin G., [name]. Post procedure Diagnosis Wound #4: Same as Pre-Procedure Plan Follow-up Appointments: Other: - Thursday or Friday , then in 1 week Dressing Change Frequency: Other: - thursday or friday, then in 1 week Wound Cleansing: May shower with protection. Primary Wound Dressing: Silver Collagen - moisten with normal  saline or KY jelly Secondary Dressing: Wound #4 Right T Great: oe Foam - foam donut Dry  Gauze Off-Loading: Wound #4 Right T Great: oe T Contact Cast to Right Lower Extremity otal Removable cast walker boot to: - right lower leg 1. Silver collagen as the primary dressing 2. Foam donut 3. Gauze Before total contact cast 5. He will have to come back later in the week for the obligatory first change Electronic Signature(s) Signed: 04/11/2020 5:41:11 PM By: Baltazar Najjar [name] Entered By: Baltazar Najjar on 04/11/2020 09:08:03 -------------------------------------------------------------------------------- Total Contact Cast Details Patient Name: Date of Service: [place], [name] Collier 04/11/2020 8:15 A M Medical Record Number: [id number] Patient Account Number: [id number] Date of Birth/Sex: Treating RN: 23-Nov-1955 (64 y.o. [name] Primary Care Provider: Cynda Familia Other Clinician: Referring Provider: Treating Provider/Extender: Willia Craze in Treatment: 11 T Contact Cast Applied for Wound Assessment: otal Wound #4 Right T Great oe Performed By: Physician Todd Caul., [name] Post Procedure Diagnosis Same as Pre-procedure Electronic Signature(s) Signed: 04/11/2020 5:41:11 PM By: Baltazar Najjar [name] Entered By: Baltazar Najjar on 04/11/2020 09:05:02 -------------------------------------------------------------------------------- SuperBill Details Patient Name: Date of Service: Lorrene Reid Collier 04/11/2020 Medical Record Number: [id number] Patient Account Number: [id number] Date of Birth/Sex: Treating RN: 05-11-1956 (64 y.o. [name] Primary Care Provider: Cynda Familia Other Clinician: Referring Provider: Treating Provider/Extender: Willia Craze in Treatment: 11 Diagnosis Coding ICD-10 Codes Code Description E11.621 Type 2 diabetes mellitus with foot ulcer E11.42 Type 2 diabetes mellitus with  diabetic polyneuropathy L97.512 Non-pressure chronic ulcer of other part of right foot with fat layer exposed Facility Procedures CPT4 Code: [id number] Description: [phone] - APPLY TOTAL CONTACT LEG CAST ICD-10 Diagnosis Description E11.621 Type 2 diabetes mellitus with foot ulcer Modifier: Quantity: 1 Physician Procedures : CPT4 Code Description Modifier [id number] - WC PHYS APPLY TOTAL CONTACT CAST ICD-10 Diagnosis Description E11.621 Type 2 diabetes mellitus with foot ulcer Quantity: 1 Electronic Signature(s) Signed: 04/11/2020 5:41:11 PM By: Baltazar Najjar [name] Entered By: Baltazar Najjar on 04/11/2020 09:08:23

## 2020-04-11 NOTE — Progress Notes (Signed)
DAKWAN, PRIDGEN ([id number]) Visit Report for 04/11/2020 Arrival Information Details Patient Name: Date of Service: [place], [place] 04/11/2020 8:15 A M Medical Record Number: [id number] Patient Account Number: [id number] Date of Birth/Sex: Treating RN: 1955/11/04 (64 y.o. Ernestene Mention Primary Care Breane Grunwald: Howie Ill Other Clinician: Referring Minnetta Sandora: Treating Krishon Adkison/Extender: Aldine Contes in Treatment: 11 Visit Information History Since Last Visit Added or deleted any medications: No Patient Arrived: Ambulatory Any new allergies or adverse reactions: No Arrival Time: 08:30 Had a fall or experienced change in No Accompanied By: self activities of daily living that may affect Transfer Assistance: None risk of falls: Patient Identification Verified: Yes Signs or symptoms of abuse/neglect since No Secondary Verification Process Completed: Yes last visito Patient Requires Transmission-Based Precautions: No Hospitalized since last visit: No Patient Has Alerts: No Implantable device outside of the clinic No excluding cellular tissue based products placed in the center since last visit: Has Dressing in Place as Prescribed: Yes Has Footwear/Offloading in Place as Yes Prescribed: Right: Surgical Shoe with Pressure Relief Insole Pain Present Now: Yes Electronic Signature(s) Signed: 04/11/2020 4:37:34 PM By: Baruch Gouty RN, BSN Entered By: Baruch Gouty on 04/11/2020 08:32:31 -------------------------------------------------------------------------------- Encounter Discharge Information Details Patient Name: Date of Service: [place], [place] 04/11/2020 8:15 A M Medical Record Number: [id number] Patient Account Number: [id number] Date of Birth/Sex: Treating RN: [birth year] (64 y.o. Hessie Diener Primary Care Sruthi Maurer: Howie Ill Other Clinician: Referring Concha Sudol: Treating Sritha Chauncey/Extender: Aldine Contes in Treatment: 11 Encounter Discharge Information Items Discharge Condition: Stable Ambulatory Status: Ambulatory Discharge Destination: Home Transportation: Private Auto Accompanied By: self Schedule Follow-up Appointment: Yes Clinical Summary of Care: Electronic Signature(s) Signed: 04/11/2020 5:27:04 PM By: Deon Pilling Entered By: Deon Pilling on 04/11/2020 09:25:22 -------------------------------------------------------------------------------- Lower Extremity Assessment Details Patient Name: Date of Service: [place], [place] 04/11/2020 8:15 A M Medical Record Number: [id number] Patient Account Number: [id number] Date of Birth/Sex: Treating RN: 1955/07/27 (64 y.o. Ulyses Amor, Vaughan Basta Primary Care Gery Sabedra: Howie Ill Other Clinician: Referring Heraclio Seidman: Treating Kourtney Montesinos/Extender: Aldine Contes in Treatment: 11 Edema Assessment Assessed: Shirlyn Goltz: No] Patrice Paradise: No] Edema: [Left: N] [Right: o] Calf Left: Right: Point of Measurement: From Medial Instep 38 cm Ankle Left: Right: Point of Measurement: From Medial Instep 22 cm Vascular Assessment Pulses: Dorsalis Pedis Palpable: [Right:Yes] Electronic Signature(s) Signed: 04/11/2020 4:37:34 PM By: Baruch Gouty RN, BSN Entered By: Baruch Gouty on 04/11/2020 08:39:50 -------------------------------------------------------------------------------- Multi Wound Chart Details Patient Name: Date of Service: [place], [place] 04/11/2020 8:15 A M Medical Record Number: [id number] Patient Account Number: [id number] Date of Birth/Sex: Treating RN: 1956/03/23 (64 y.o. Jerilynn Mages) Carlene Coria Primary Care Latasha Puskas: Howie Ill Other Clinician: Referring Blaiden Werth: Treating Mayeli Bornhorst/Extender: Aldine Contes in Treatment: 11 Vital Signs Height(in): 70 Capillary Blood Glucose(mg/dl): 186 Weight(lbs): 222 Pulse(bpm): 28 Body Mass Index(BMI): 32 Blood Pressure(mmHg):  178/97 Temperature(F): 97.9 Respiratory Rate(breaths/min): 18 Photos: [4:No Photos Right T Great oe] [N/A:N/A N/A] Wound Location: [4:Blister] [N/A:N/A] Wounding Event: [4:Diabetic Wound/Ulcer of the Lower] [N/A:N/A] Primary Etiology: [4:Extremity Cataracts, Hypertension, Type II] [N/A:N/A] Comorbid History: [4:Diabetes, Neuropathy, Confinement Anxiety 12/24/2019] [N/A:N/A] Date Acquired: [4:11] [N/A:N/A] Weeks of Treatment: [4:Open] [N/A:N/A] Wound Status: [4:0.7x0.6x0.3] [N/A:N/A] Measurements L x W x D (cm) [4:0.33] [N/A:N/A] A (cm) : rea [4:0.099] [N/A:N/A] Volume (cm) : [4:-134.00%] [N/A:N/A] % Reduction in A rea: [4:-39.40%] [N/A:N/A] % Reduction in Volume: [4:12] Starting Position 1 (o'clock): [4:6] Ending Position 1 (o'clock): [4:0.2] Maximum Distance 1 (cm): [  4:Yes] [N/A:N/A] Undermining: [4:Grade 2] [N/A:N/A] Classification: [4:Small] [N/A:N/A] Exudate A mount: [4:Serosanguineous] [N/A:N/A] Exudate Type: [4:red, brown] [N/A:N/A] Exudate Color: [4:Well defined, not attached] [N/A:N/A] Wound Margin: [4:Large (67-100%)] [N/A:N/A] Granulation A mount: [4:Red, Pale] [N/A:N/A] Granulation Quality: [4:Small (1-33%)] [N/A:N/A] Necrotic A mount: [4:Fat Layer (Subcutaneous Tissue): Yes N/A] Exposed Structures: [4:Fascia: No Tendon: No Muscle: No Joint: No Bone: No None] [N/A:N/A] Epithelialization: [4:T Contact Cast otal] [N/A:N/A] Treatment Notes Electronic Signature(s) Signed: 04/11/2020 5:41:11 PM By: Linton Ham MD Signed: 04/11/2020 5:49:00 PM By: Carlene Coria RN Entered By: Linton Ham on 04/11/2020 09:04:54 -------------------------------------------------------------------------------- Multi-Disciplinary Care Plan Details Patient Name: Date of Service: [place], [place] 04/11/2020 8:15 A M Medical Record Number: [id number] Patient Account Number: [id number] Date of Birth/Sex: Treating RN: [birth year] (64 y.o. Oval Linsey Primary Care Taetum Flewellen: Howie Ill Other Clinician: Referring Maurisa Tesmer: Treating Morgann Woodburn/Extender: Aldine Contes in Treatment: 11 Active Inactive Wound/Skin Impairment Nursing Diagnoses: Knowledge deficit related to ulceration/compromised skin integrity Goals: Patient/caregiver will verbalize understanding of skin care regimen Date Initiated: 01/25/2020 Target Resolution Date: 05/18/2020 Goal Status: Active Ulcer/skin breakdown will have a volume reduction of 30% by week 4 Date Initiated: 01/25/2020 Date Inactivated: 04/04/2020 Target Resolution Date: 03/17/2020 Goal Status: Met Ulcer/skin breakdown will have a volume reduction of 50% by week 8 Date Initiated: 04/04/2020 Date Inactivated: 04/11/2020 Target Resolution Date: 04/17/2020 Goal Status: Met Ulcer/skin breakdown will have a volume reduction of 80% by week 12 Date Initiated: 04/11/2020 Target Resolution Date: 05/18/2020 Goal Status: Active Interventions: Assess patient/caregiver ability to obtain necessary supplies Assess patient/caregiver ability to perform ulcer/skin care regimen upon admission and as needed Assess ulceration(s) every visit Notes: Electronic Signature(s) Signed: 04/11/2020 5:49:00 PM By: Carlene Coria RN Entered By: Carlene Coria on 04/11/2020 08:35:53 -------------------------------------------------------------------------------- Pain Assessment Details Patient Name: Date of Service: [place], [place] 04/11/2020 8:15 A M Medical Record Number: [id number] Patient Account Number: [id number] Date of Birth/Sex: Treating RN: [birth year] (64 y.o. Ernestene Mention Primary Care Daja Shuping: Howie Ill Other Clinician: Referring Alanea Woolridge: Treating Noor Vidales/Extender: Aldine Contes in Treatment: 11 Active Problems Location of Pain Severity and Description of Pain Patient Has Paino No Site Locations Rate the pain. Current Pain Level: 0 Pain Management and  Medication Current Pain Management: Electronic Signature(s) Signed: 04/11/2020 4:37:34 PM By: Baruch Gouty RN, BSN Entered By: Baruch Gouty on 04/11/2020 08:35:55 -------------------------------------------------------------------------------- Patient/Caregiver Education Details Patient Name: Date of Service: Zena[place] 10/12/2021andnbsp8:15 [place] Record Number: [id number] Patient Account Number: [id number] Date of Birth/Gender: Treating RN: 21-Feb-1956 (64 y.o. Jerilynn Mages) Carlene Coria Primary Care Physician: Howie Ill Other Clinician: Referring Physician: Treating Physician/Extender: Aldine Contes in Treatment: 11 Education Assessment Education Provided To: Patient Education Topics Provided Wound/Skin Impairment: Methods: Explain/Verbal Responses: State content correctly Electronic Signature(s) Signed: 04/11/2020 5:49:00 PM By: Carlene Coria RN Entered By: Carlene Coria on 04/11/2020 08:34:55 -------------------------------------------------------------------------------- Wound Assessment Details Patient Name: Date of Service: [place], [place] 04/11/2020 8:15 A M Medical Record Number: [id number] Patient Account Number: [id number] Date of Birth/Sex: Treating RN: 1955/08/24 (63 y.o. Ernestene Mention Primary Care Murrell Dome: Howie Ill Other Clinician: Referring Bryanna Yim: Treating Sarissa Dern/Extender: Aldine Contes in Treatment: 11 Wound Status Wound Number: 4 Primary Diabetic Wound/Ulcer of the Lower Extremity Etiology: Wound Location: Right T Great oe Wound Status: Open Wounding Event: Blister Comorbid Cataracts, Hypertension, Type II Diabetes, Neuropathy, Date Acquired: 12/24/2019 History: Confinement Anxiety Weeks Of Treatment: 11 Clustered Wound: No Photos Photo  Uploaded By: Mikeal Hawthorne on 04/11/2020 11:46:48 Wound Measurements Length: (cm) 0.7 Width: (cm) 0.6 Depth: (cm) 0.3 Area:  (cm) 0.33 Volume: (cm) 0.099 % Reduction in Area: -134% % Reduction in Volume: -39.4% Epithelialization: None Tunneling: No Undermining: Yes Starting Position (o'clock): 12 Ending Position (o'clock): 6 Maximum Distance: (cm) 0.2 Wound Description Classification: Grade 2 Wound Margin: Well defined, not attached Exudate Amount: Small Exudate Type: Serosanguineous Exudate Color: red, brown Wound Bed Granulation Amount: Large (67-100%) Granulation Quality: Red, Pale Necrotic Amount: Small (1-33%) Necrotic Quality: Adherent Slough Foul Odor After Cleansing: No Slough/Fibrino No Exposed Structure Fascia Exposed: No Fat Layer (Subcutaneous Tissue) Exposed: Yes Tendon Exposed: No Muscle Exposed: No Joint Exposed: No Bone Exposed: No Treatment Notes Wound #4 (Right Toe Great) 1. Cleanse With Wound Cleanser 2. Periwound Care Skin Prep 3. Primary Dressing Applied Collegen AG Hydrogel or K-Y Jelly 4. Secondary Dressing Dry Gauze Foam 5. Secured With Medipore tape 7. Footwear/Offloading device applied T Contact Cast otal Notes foam donut as secondary. last layer of TCC applied by MD. Electronic Signature(s) Signed: 04/11/2020 4:37:34 PM By: Baruch Gouty RN, BSN Entered By: Baruch Gouty on 04/11/2020 08:41:07 -------------------------------------------------------------------------------- [place] Details Patient Name: Date of Service: [place], [place] 04/11/2020 8:15 A M Medical Record Number: [id number] Patient Account Number: [id number] Date of Birth/Sex: Treating RN: 05/15/1956 (64 y.o. Ernestene Mention Primary Care Leilynn Pilat: Howie Ill Other Clinician: Referring Hanz Winterhalter: Treating Silvanna Ohmer/Extender: Aldine Contes in Treatment: 11 Vital Signs Time Taken: 08:32 Temperature (F): 97.9 Height (in): 70 Pulse (bpm): 73 Weight (lbs): 222 Respiratory Rate (breaths/min): 18 Body Mass Index (BMI): 31.9 Blood Pressure  (mmHg): 178/97 Capillary Blood Glucose (mg/dl): 186 Reference Range: 80 - 120 mg / dl Notes glucose per pt report yesterday Electronic Signature(s) Signed: 04/11/2020 4:37:34 PM By: Baruch Gouty RN, BSN Entered By: Baruch Gouty on 04/11/2020 08:35:47

## 2020-04-13 ENCOUNTER — Encounter (HOSPITAL_BASED_OUTPATIENT_CLINIC_OR_DEPARTMENT_OTHER): Payer: 59 | Admitting: Internal Medicine

## 2020-04-14 ENCOUNTER — Encounter (HOSPITAL_BASED_OUTPATIENT_CLINIC_OR_DEPARTMENT_OTHER): Payer: 59 | Admitting: Internal Medicine

## 2020-04-14 DIAGNOSIS — E11621 Type 2 diabetes mellitus with foot ulcer: Secondary | ICD-10-CM | POA: Diagnosis not present

## 2020-04-14 NOTE — Progress Notes (Signed)
QUADIR, MUNS ([id number]) Visit Report for 04/14/2020 Arrival Information Details Patient Name: Date of Service: [place], [place] 04/14/2020 10:15 A M Medical Record Number: [id number] Patient Account Number: [id number] Date of Birth/Sex: Treating RN: [birth year] (64 y.o. Hessie Diener Primary Care Makael Stein: Howie Ill Other Clinician: Referring Wilba Mutz: Treating Aby Gessel/Extender: Aldine Contes in Treatment: 11 Visit Information History Since Last Visit Added or deleted any medications: No Patient Arrived: Ambulatory Any new allergies or adverse reactions: No Arrival Time: 10:41 Had a fall or experienced change in No Accompanied By: self activities of daily living that may affect Transfer Assistance: None risk of falls: Patient Identification Verified: Yes Signs or symptoms of abuse/neglect since last visito No Secondary Verification Process Completed: Yes Hospitalized since last visit: No Patient Requires Transmission-Based Precautions: No Implantable device outside of the clinic excluding No Patient Has Alerts: No cellular tissue based products placed in the center since last visit: Has Dressing in Place as Prescribed: Yes Has Footwear/Offloading in Place as Prescribed: Yes Right: T Contact Cast otal Pain Present Now: No Electronic Signature(s) Signed: 04/14/2020 5:54:53 PM By: Deon Pilling Entered By: Deon Pilling on 04/14/2020 10:41:47 -------------------------------------------------------------------------------- Lower Extremity Assessment Details Patient Name: Date of Service: [place], [place] 04/14/2020 10:15 A M Medical Record Number: [id number] Patient Account Number: [id number] Date of Birth/Sex: Treating RN: 01/24/1956 (64 y.o. Hessie Diener Primary Care Jerold Yoss: Howie Ill Other Clinician: Referring Jatziry Wechter: Treating Kendryck Lacroix/Extender: Aldine Contes in Treatment: 11 Edema  Assessment Assessed: Shirlyn Goltz: No] Patrice Paradise: Yes] Edema: [Left: N] [Right: o] Calf Left: Right: Point of Measurement: From Medial Instep 37 cm Ankle Left: Right: Point of Measurement: From Medial Instep 22 cm Vascular Assessment Pulses: Dorsalis Pedis Palpable: [Right:Yes] Electronic Signature(s) Signed: 04/14/2020 5:54:53 PM By: Deon Pilling Entered By: Deon Pilling on 04/14/2020 11:06:25 -------------------------------------------------------------------------------- Multi Wound Chart Details Patient Name: Date of Service: [place], [place] 04/14/2020 10:15 A M Medical Record Number: [id number] Patient Account Number: [id number] Date of Birth/Sex: Treating RN: April 14, 1956 (64 y.o. Ernestene Mention Primary Care Ellicia Alix: Howie Ill Other Clinician: Referring Emileigh Kellett: Treating Charnell Peplinski/Extender: Aldine Contes in Treatment: 11 Vital Signs Height(in): 70 Capillary Blood Glucose(mg/dl): 132 Weight(lbs): 222 Pulse(bpm): 78 Body Mass Index(BMI): 32 Blood Pressure(mmHg): 178/89 Temperature(F): 98.3 Respiratory Rate(breaths/min): 16 Photos: [4:No Photos Right T Great oe] [N/A:N/A N/A] Wound Location: [4:Blister] [N/A:N/A] Wounding Event: [4:Diabetic Wound/Ulcer of the Lower] [N/A:N/A] Primary Etiology: [4:Extremity Cataracts, Hypertension, Type II] [N/A:N/A] Comorbid History: [4:Diabetes, Neuropathy, Confinement Anxiety 12/24/2019] [N/A:N/A] Date Acquired: [4:11] [N/A:N/A] Weeks of Treatment: [4:Open] [N/A:N/A] Wound Status: [4:0.3x0.4x0.2] [N/A:N/A] Measurements L x W x D (cm) [4:0.094] [N/A:N/A] A (cm) : rea [4:0.019] [N/A:N/A] Volume (cm) : [4:33.30%] [N/A:N/A] % Reduction in A rea: [4:73.20%] [N/A:N/A] % Reduction in Volume: [4:Grade 2] [N/A:N/A] Classification: [4:Small] [N/A:N/A] Exudate A mount: [4:Serosanguineous] [N/A:N/A] Exudate Type: [4:red, brown] [N/A:N/A] Exudate Color: [4:Well defined, not attached] [N/A:N/A] Wound  Margin: [4:Large (67-100%)] [N/A:N/A] Granulation A mount: [4:Red, Pale] [N/A:N/A] Granulation Quality: [4:None Present (0%)] [N/A:N/A] Necrotic A mount: [4:Fat Layer (Subcutaneous Tissue): Yes N/A] Exposed Structures: [4:Fascia: No Tendon: No Muscle: No Joint: No Bone: No Medium (34-66%)] [N/A:N/A] Epithelialization: [4:T Contact Cast otal] [N/A:N/A] Treatment Notes Electronic Signature(s) Signed: 04/14/2020 6:16:54 PM By: Linton Ham MD Signed: 04/14/2020 6:20:53 PM By: Baruch Gouty RN, BSN Entered By: Linton Ham on 04/14/2020 12:44:03 -------------------------------------------------------------------------------- Multi-Disciplinary Care Plan Details Patient Name: Date of Service: [place], [place] 04/14/2020 10:15 A M Medical Record Number: [id number] Patient Account  Number: [id number] Date of Birth/Sex: Treating RN: February 27, 1956 (64 y.o. Ernestene Mention Primary Care Delmore Sear: Howie Ill Other Clinician: Referring Shaughnessy Gethers: Treating Keandra Medero/Extender: Aldine Contes in Treatment: 11 Active Inactive Wound/Skin Impairment Nursing Diagnoses: Knowledge deficit related to ulceration/compromised skin integrity Goals: Patient/caregiver will verbalize understanding of skin care regimen Date Initiated: 01/25/2020 Target Resolution Date: 05/18/2020 Goal Status: Active Ulcer/skin breakdown will have a volume reduction of 30% by week 4 Date Initiated: 01/25/2020 Date Inactivated: 04/04/2020 Target Resolution Date: 03/17/2020 Goal Status: Met Ulcer/skin breakdown will have a volume reduction of 50% by week 8 Date Initiated: 04/04/2020 Date Inactivated: 04/11/2020 Target Resolution Date: 04/17/2020 Goal Status: Met Ulcer/skin breakdown will have a volume reduction of 80% by week 12 Date Initiated: 04/11/2020 Target Resolution Date: 05/18/2020 Goal Status: Active Interventions: Assess patient/caregiver ability to obtain necessary  supplies Assess patient/caregiver ability to perform ulcer/skin care regimen upon admission and as needed Assess ulceration(s) every visit Notes: Electronic Signature(s) Signed: 04/14/2020 6:20:53 PM By: Baruch Gouty RN, BSN Entered By: Baruch Gouty on 04/14/2020 10:55:18 -------------------------------------------------------------------------------- Pain Assessment Details Patient Name: Date of Service: [place], [place] 04/14/2020 10:15 A M Medical Record Number: [id number] Patient Account Number: [id number] Date of Birth/Sex: Treating RN: [birth year] (64 y.o. Hessie Diener Primary Care Alleyah Twombly: Howie Ill Other Clinician: Referring Trestan Vahle: Treating Anginette Espejo/Extender: Aldine Contes in Treatment: 11 Active Problems Location of Pain Severity and Description of Pain Patient Has Paino No Site Locations Rate the pain. Rate the pain. Current Pain Level: 0 Pain Management and Medication Current Pain Management: Medication: No Cold Application: No Rest: No Massage: No Activity: No T.E.N.S.: No Heat Application: No Leg drop or elevation: No Is the Current Pain Management Adequate: Adequate How does your wound impact your activities of daily livingo Sleep: No Bathing: No Appetite: No Relationship With Others: No Bladder Continence: No Emotions: No Bowel Continence: No Work: No Toileting: No Drive: No Dressing: No Hobbies: No Electronic Signature(s) Signed: 04/14/2020 5:54:53 PM By: Deon Pilling Entered By: Deon Pilling on 04/14/2020 10:42:59 -------------------------------------------------------------------------------- Patient/Caregiver Education Details Patient Name: Date of Service: [place], [name] 10/15/2021andnbsp10:15 A M Medical Record Number: [id number] Patient Account Number: [id number] Date of Birth/Gender: Treating RN: [birth year] (64 y.o. Ernestene Mention Primary Care Physician: Howie Ill Other  Clinician: Referring Physician: Treating Physician/Extender: Aldine Contes in Treatment: 11 Education Assessment Education Provided To: Patient Education Topics Provided Offloading: Methods: Explain/Verbal Responses: Reinforcements needed, State content correctly Wound/Skin Impairment: Methods: Explain/Verbal Responses: Reinforcements needed, State content correctly Electronic Signature(s) Signed: 04/14/2020 6:20:53 PM By: Baruch Gouty RN, BSN Signed: 04/14/2020 6:20:53 PM By: Baruch Gouty RN, BSN Entered By: Baruch Gouty on 04/14/2020 10:56:29 -------------------------------------------------------------------------------- Wound Assessment Details Patient Name: Date of Service: [place], [place] 04/14/2020 10:15 A M Medical Record Number: [id number] Patient Account Number: [id number] Date of Birth/Sex: Treating RN: 09/25/1955 (64 y.o. Lorette Ang, Meta.Reding Primary Care Wadell Craddock: Howie Ill Other Clinician: Referring Tika Hannis: Treating Rola Lennon/Extender: Aldine Contes in Treatment: 11 Wound Status Wound Number: 4 Primary Diabetic Wound/Ulcer of the Lower Extremity Etiology: Wound Location: Right T Great oe Wound Status: Open Wounding Event: Blister Comorbid Cataracts, Hypertension, Type II Diabetes, Neuropathy, Date Acquired: 12/24/2019 History: Confinement Anxiety Weeks Of Treatment: 11 Clustered Wound: No Wound Measurements Length: (cm) 0.3 Width: (cm) 0.4 Depth: (cm) 0.2 Area: (cm) 0.094 Volume: (cm) 0.019 % Reduction in Area: 33.3% % Reduction in Volume: 73.2% Epithelialization: Medium (34-66%) Tunneling: No Undermining: No  Wound Description Classification: Grade 2 Wound Margin: Well defined, not attached Exudate Amount: Small Exudate Type: Serosanguineous Exudate Color: red, brown Foul Odor After Cleansing: No Slough/Fibrino No Wound Bed Granulation Amount: Large (67-100%) Exposed  Structure Granulation Quality: Red, Pale Fascia Exposed: No Necrotic Amount: None Present (0%) Fat Layer (Subcutaneous Tissue) Exposed: Yes Tendon Exposed: No Muscle Exposed: No Joint Exposed: No Bone Exposed: No Electronic Signature(s) Signed: 04/14/2020 5:54:53 PM By: Deon Pilling Entered By: Deon Pilling on 04/14/2020 11:06:53 -------------------------------------------------------------------------------- Vitals Details Patient Name: Date of Service: [place], [place] 04/14/2020 10:15 A M Medical Record Number: [id number] Patient Account Number: [id number] Date of Birth/Sex: Treating RN: 31-Oct-1955 (64 y.o. Lorette Ang, Meta.Reding Primary Care Nikolus Marczak: Howie Ill Other Clinician: Referring Imir Brumbach: Treating Kaiea Esselman/Extender: Aldine Contes in Treatment: 11 Vital Signs Time Taken: 10:40 Temperature (F): 98.3 Height (in): 70 Pulse (bpm): 78 Weight (lbs): 222 Respiratory Rate (breaths/min): 16 Body Mass Index (BMI): 31.9 Blood Pressure (mmHg): 178/89 Capillary Blood Glucose (mg/dl): 132 Reference Range: 80 - 120 mg / dl Notes Discussed with patient his elevated BP and taking his oral BP medication. Discussed to speak with Cardiologist related to elevated BP and BP meds. Electronic Signature(s) Signed: 04/14/2020 5:54:53 PM By: Deon Pilling Entered By: Deon Pilling on 04/14/2020 10:42:52

## 2020-04-14 NOTE — Progress Notes (Signed)
[name], [name] ([id number]) Visit Report for 04/14/2020 HPI Details Patient Name: Date of Service: [name], [name] 04/14/2020 10:15 A M Medical Record Number: [id number] Patient Account Number: [id number] Date of Birth/Sex: Treating RN: 1956-05-22 (64 y.o. [name] Primary Care Provider: Cynda Collier Other Clinician: Referring Provider: Treating Provider/Extender: [name] in Treatment: 11 History of Present Illness HPI Description: 12/16/18 on evaluation today patient presents for initial evaluation our clinic as a self-referral due to a wound that is had on his right first toe for 6-8 weeks. He states his last hemoglobin A1c which was roughly 4 months ago was 11.0 he knows he has not been taking care of himself in this regard. He's actually been out of medications for two weeks both in regard to his diabetes as well as his high blood pressure. He states he is going to see those doctors in the next week. Subsequently he actually told me that being here in the clinic and seeing so many patients that did have applications in such has really got him thinking about the fact that he needs to take better care of himself in this regard. Fortunately there's no signs of active infection at this time which is good news. No fevers, chills, nausea, or vomiting noted at this time. He has been seen a local podiatrist for the past 6-8 weeks he was recommending for the patient that he soak in Epsom salts and come in on the patient tells me generally Tuesday and Thursday for debridement's and otherwise was told to leave the wound open to air just put a sock on and go about his business and that "it would scab up and be okay". With that being said the patient states when he realized things were not getting better and he really did not agree with the treatment plan he decided to get a second opinion here the wound care center. Apparently he's had x-rays although the  patient's podiatrist has not sent [place] records at this point I think we may want to send them to the hospital for an x-ray just to have everything documented before we see about what we need to do from the standpoint of more aggressive treatment such as potentially casting all over this be his right foot that's gonna be somewhat difficult in my pinion. 12/23/18 on evaluation today patient actually appears to be doing okay in regard to his plantar foot ulcer. Unfortunately he was not able to go get the x-ray during the last visit here in the office. Subsequently he states that he actually forgot to go do that in the interim between last week and this week. That is something is gonna go do as soon as he leaves here today. We discussed again that I do think the total contact cast would be the appropriate way to go in the best way to get the news to heal. With that being said beginning the x-ray clear before we proceed with the cast he understands. 6/26; patient here for obligatory total contact cast change no complaints 12/30/18 on evaluation today patient actually appears to be doing excellent in regard to his plantar foot wound. This is looking tremendously better there's new callous buildup it's not as deep and overall show signs of excellent improvement. I'm very pleased in this regard. Overall I think that he should continue with the Current wound care measures including the total contact cast as I believe this is gonna be most helpful in getting this to  heal very quickly. 01/06/19 on evaluation today patient actually appears to be doing quite well with regard to his toe ulcer. This is shown signs of good improvement in fact is measuring smaller by about half of what it was last week. Nonetheless I think this is excellent improvement is filled in an overall is making great progress. No fevers, chills, nausea, or vomiting noted at this time. 7/16; patient is here for review of a wound on the plantar aspect  of his right great toe. Been using silver collagen under a total contact cast 01/20/19 on evaluation today patient actually presents for a slightly early follow-up due to the fact that his cast has been rubbing on the right medial ankle region. Unfortunately he has a new wound at this location because of the rubbing of the cast. There does not appear to be signs of active infection which is good news. No fevers, chills, nausea, or vomiting noted at this time. Overall been pleased with how things have progressed in regard to his toe again today this appears to be doing well. 02/01/19-Patient returns at 12 days being out of the cast on account of going to the beach, he has been using Prisma to his wounds on his right medial ankle and right great toe plantar aspect. Both appear to be around the same dimensions. 02/08/19-Patient returns at 1 week after being in a cast both the wounds are better in fact the right great plantar wound is healed, right medial ankle wound looks better. We are using Prisma. Does not need to be in cast anymore 8/24; 2-week follow-up. Everything is healed here including the right great plantar toe and the cast injury on the medial ankle. He still has callus on the plantar right great toe. He has gone to [employer] I believe and has custom inserts at least. His wounds have closed over READMISSION 01/25/2020. This is a 64 year old man with poorly controlled type 2 diabetes with a recent hemoglobin A1c of 9.3. He also has diabetic neuropathy. We had him here extensively in 2020 again for a wound on the plantar aspect of the right great toe. Eventually we heal this over and a total contact cast. He got custom inserts for his running shoes at [employer]. He has been using them reliably. He states that roughly 3 to 4 weeks ago he developed a thick callus on the bottom of the toe it eventually separated or split and underneath was an open wound. He saw his primary doctor who would and put him on  doxycycline for 10 days. Past medical history type 2 diabetes with peripheral neuropathy, hyperlipidemia and hypertension. ABI in our clinic was 1.17 on the right 02/11/20-Patient back at 2 weeks, right great toe plantar wound is about the same with slight callus build up. 8/27; 2-week follow-up right. Aggressively debrided last time considerable callus buildup. We have been using silver collagen. This is definitely going to require a total contact cast however a week today his daughter gets married we elected to delay that until the Monday after that I E 10 days from now. 9/7; right plantar toe. Aggressively debrided last time and the wound actually looks some better with less depth. 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 In view of this I did not put a total contact cast on. Also noted he is having marked indentation of his right great toe in the surgical shoe he is wearing  9/14; right plantar great toe. Surface of the wound looks healthy again he has overriding skin. He is leaving for [place] on vacation on 9/24, after this I think he will require a total contact cast. We started him off on silver collagen for some reason he changed to silver alginate I am going to change him back to silver collagen today 10/5; right plantar great toe. [place] is returned from [place] he was not offloading this at all I am not exactly sure whether he was even dressing this. Arrives with extensive undermining from the wound on the plantar aspect of the right great toe. He has a surgical shoe but nothing else no felt etc. 10/12; right plantar great toe. The wound does not look unhealthy however its has thick edges overhanging skin. He is ready for a total contact cast. Still using silver collagen as the primary dressing 10/15; the patient is here for the obligatory first total contact cast change. This was done in the standard fashion the wound was not  examined Electronic Signature(s) Signed: 04/14/2020 6:16:54 PM By: Baltazar Najjarobson, Terryn Redner MD Entered By: Baltazar Najjarobson, Buren Havey on 04/14/2020 12:44:41 -------------------------------------------------------------------------------- Physician Orders Details Patient Name: Date of Service: [place], [name] 04/14/2020 10:15 A M Medical Record Number: [id number] Patient Account Number: [id number] Date of Birth/Sex: Treating RN: Aug 24, 1955 (64 y.o. Tammy SoursM) Deaton, Bobbi Primary Care Provider: Cynda FamiliaMeyers, Stephen C Other Clinician: Referring Provider: Treating Provider/Extender: Todd Crazeobson, Gaylan Fauver Meyers, Stephen C Weeks in Treatment: 11 Verbal / Phone Orders: No Diagnosis Coding ICD-10 Coding Code Description E11.621 Type 2 diabetes mellitus with foot ulcer E11.42 Type 2 diabetes mellitus with diabetic polyneuropathy L97.512 Non-pressure chronic ulcer of other part of right foot with fat layer exposed Follow-up Appointments ppointment in 1 week. - tuesday Return A Dressing Change Frequency Do not change entire dressing for one week. Wound Cleansing May shower with protection. Primary Wound Dressing Silver Collagen - moisten with normal saline or KY jelly Secondary Dressing Wound #4 Right T Great oe Foam - foam donut Dry Gauze Off-Loading Wound #4 Right T Great oe Total Contact Cast to Right Lower Extremity Electronic Signature(s) Signed: 04/14/2020 5:54:53 PM By: Shawn Stalleaton, Bobbi Signed: 04/14/2020 6:16:54 PM By: Baltazar Najjarobson, Shahram Alexopoulos MD Entered By: Shawn Stalleaton, Bobbi on 04/14/2020 11:11:42 -------------------------------------------------------------------------------- Problem List Details Patient Name: Date of Service: [place], [name] 04/14/2020 10:15 A M Medical Record Number: [id number] Patient Account Number: [id number] Date of Birth/Sex: Treating RN: Aug 24, 1955 (64 y.o. Todd SchoonerM) Boehlein, Linda Primary Care Provider: Cynda FamiliaMeyers, Stephen C Other Clinician: Referring Provider: Treating Provider/Extender: Todd Crazeobson,  Amnah Breuer Meyers, Stephen C Weeks in Treatment: 11 Active Problems ICD-10 Encounter Code Description Active Date MDM Diagnosis E11.621 Type 2 diabetes mellitus with foot ulcer 01/25/2020 No Yes E11.42 Type 2 diabetes mellitus with diabetic polyneuropathy 01/25/2020 No Yes L97.512 Non-pressure chronic ulcer of other part of right foot with fat layer exposed 01/25/2020 No Yes Inactive Problems Resolved Problems Electronic Signature(s) Signed: 04/14/2020 6:16:54 PM By: Baltazar Najjarobson, Brizeyda Holtmeyer MD Entered By: Baltazar Najjarobson, Tommie Bohlken on 04/14/2020 12:43:52 -------------------------------------------------------------------------------- Progress Note Details Patient Name: Date of Service: Todd PriceUGGINS, [name] 04/14/2020 10:15 A M Medical Record Number: [id number] Patient Account Number: [id number] Date of Birth/Sex: Treating RN: Aug 24, 1955 (64 y.o. Todd SchoonerM) Boehlein, Linda Primary Care Provider: Cynda FamiliaMeyers, Stephen C Other Clinician: Referring Provider: Treating Provider/Extender: Todd Crazeobson, Cordie Beazley Meyers, Stephen C Weeks in Treatment: 11 Subjective History of Present Illness (HPI) 12/16/18 on evaluation today patient presents for initial evaluation our clinic as a self-referral due to a wound that is had on his right first toe for 6-8  weeks. He states his last hemoglobin A1c which was roughly 4 months ago was 11.0 he knows he has not been taking care of himself in this regard. He's actually been out of medications for two weeks both in regard to his diabetes as well as his high blood pressure. He states he is going to see those doctors in the next week. Subsequently he actually told me that being here in the clinic and seeing so many patients that did have applications in such has really got him thinking about the fact that he needs to take better care of himself in this regard. Fortunately there's no signs of active infection at this time which is good news. No fevers, chills, nausea, or vomiting noted at this time. He has been  seen a local podiatrist for the past 6-8 weeks he was recommending for the patient that he soak in Epsom salts and come in on the patient tells me generally Tuesday and Thursday for debridement's and otherwise was told to leave the wound open to air just put a sock on and go about his business and that "it would scab up and be okay". With that being said the patient states when he realized things were not getting better and he really did not agree with the treatment plan he decided to get a second opinion here the wound care center. Apparently he's had x-rays although the patient's podiatrist has not sent [place] records at this point I think we may want to send them to the hospital for an x-ray just to have everything documented before we see about what we need to do from the standpoint of more aggressive treatment such as potentially casting all over this be his right foot that's gonna be somewhat difficult in my pinion. 12/23/18 on evaluation today patient actually appears to be doing okay in regard to his plantar foot ulcer. Unfortunately he was not able to go get the x-ray during the last visit here in the office. Subsequently he states that he actually forgot to go do that in the interim between last week and this week. That is something is gonna go do as soon as he leaves here today. We discussed again that I do think the total contact cast would be the appropriate way to go in the best way to get the news to heal. With that being said beginning the x-ray clear before we proceed with the cast he understands. 6/26; patient here for obligatory total contact cast change no complaints 12/30/18 on evaluation today patient actually appears to be doing excellent in regard to his plantar foot wound. This is looking tremendously better there's new callous buildup it's not as deep and overall show signs of excellent improvement. I'm very pleased in this regard. Overall I think that he should continue with the  Current wound care measures including the total contact cast as I believe this is gonna be most helpful in getting this to heal very quickly. 01/06/19 on evaluation today patient actually appears to be doing quite well with regard to his toe ulcer. This is shown signs of good improvement in fact is measuring smaller by about half of what it was last week. Nonetheless I think this is excellent improvement is filled in an overall is making great progress. No fevers, chills, nausea, or vomiting noted at this time. 7/16; patient is here for review of a wound on the plantar aspect of his right great toe. Been using silver collagen under a total contact  cast 01/20/19 on evaluation today patient actually presents for a slightly early follow-up due to the fact that his cast has been rubbing on the right medial ankle region. Unfortunately he has a new wound at this location because of the rubbing of the cast. There does not appear to be signs of active infection which is good news. No fevers, chills, nausea, or vomiting noted at this time. Overall been pleased with how things have progressed in regard to his toe again today this appears to be doing well. 02/01/19-Patient returns at 12 days being out of the cast on account of going to the beach, he has been using Prisma to his wounds on his right medial ankle and right great toe plantar aspect. Both appear to be around the same dimensions. 02/08/19-Patient returns at 1 week after being in a cast both the wounds are better in fact the right great plantar wound is healed, right medial ankle wound looks better. We are using Prisma. Does not need to be in cast anymore 8/24; 2-week follow-up. Everything is healed here including the right great plantar toe and the cast injury on the medial ankle. He still has callus on the plantar right great toe. He has gone to [employer] I believe and has custom inserts at least. His wounds have closed over READMISSION 01/25/2020. This is  a 64 year old man with poorly controlled type 2 diabetes with a recent hemoglobin A1c of 9.3. He also has diabetic neuropathy. We had him here extensively in 2020 again for a wound on the plantar aspect of the right great toe. Eventually we heal this over and a total contact cast. He got custom inserts for his running shoes at [employer]. He has been using them reliably. He states that roughly 3 to 4 weeks ago he developed a thick callus on the bottom of the toe it eventually separated or split and underneath was an open wound. He saw his primary doctor who would and put him on doxycycline for 10 days. Past medical history type 2 diabetes with peripheral neuropathy, hyperlipidemia and hypertension. ABI in our clinic was 1.17 on the right 02/11/20-Patient back at 2 weeks, right great toe plantar wound is about the same with slight callus build up. 8/27; 2-week follow-up right. Aggressively debrided last time considerable callus buildup. We have been using silver collagen. This is definitely going to require a total contact cast however a week today his daughter gets married we elected to delay that until the Monday after that I E 10 days from now. 9/7; right plantar toe. Aggressively debrided last time and the wound actually looks some better with less depth. 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 In view of this I did not put a total contact cast on. Also noted he is having marked indentation of his right great toe in the surgical shoe he is wearing 9/14; right plantar great toe. Surface of the wound looks healthy again he has overriding skin. He is leaving for [place] on vacation on 9/24, after this I think he will require a total contact cast. We started him off on silver collagen for some reason he changed to silver alginate I am going to change him back to silver collagen today 10/5; right plantar great toe. He is returned from  [place] he was not offloading this at all I am not exactly sure whether he was even dressing this. Arrives with extensive undermining  from the wound on the plantar aspect of the right great toe. He has a surgical shoe but nothing else no felt etc. 10/12; right plantar great toe. The wound does not look unhealthy however its has thick edges overhanging skin. He is ready for a total contact cast. Still using silver collagen as the primary dressing 10/15; the patient is here for the obligatory first total contact cast change. This was done in the standard fashion the wound was not examined Objective Constitutional Vitals Time Taken: 10:40 AM, Height: 70 in, Weight: 222 lbs, BMI: 31.9, Temperature: 98.3 F, Pulse: 78 bpm, Respiratory Rate: 16 breaths/min, Blood Pressure: 178/89 mmHg, Capillary Blood Glucose: 132 mg/dl. General Notes: Discussed with patient his elevated BP and taking his oral BP medication. Discussed to speak with Cardiologist related to elevated BP and BP meds. Integumentary (Hair, Skin) Wound #4 status is Open. Original cause of wound was Blister. The wound is located on the Right T Great. The wound measures 0.3cm length x 0.4cm width oe x 0.2cm depth; 0.094cm^2 area and 0.019cm^3 volume. There is Fat Layer (Subcutaneous Tissue) exposed. There is no tunneling or undermining noted. There is a small amount of serosanguineous drainage noted. The wound margin is well defined and not attached to the wound base. There is large (67-100%) red, pale granulation within the wound bed. There is no necrotic tissue within the wound bed. Assessment Active Problems ICD-10 Type 2 diabetes mellitus with foot ulcer Type 2 diabetes mellitus with diabetic polyneuropathy Non-pressure chronic ulcer of other part of right foot with fat layer exposed Procedures Wound #4 Pre-procedure diagnosis of Wound #4 is a Diabetic Wound/Ulcer of the Lower Extremity located on the Right T Great . There was a T  Contact Cast oe otal Procedure by Maxwell Caul., MD. Post procedure Diagnosis Wound #4: Same as Pre-Procedure Plan Follow-up Appointments: Return Appointment in 1 week. - tuesday Dressing Change Frequency: Do not change entire dressing for one week. Wound Cleansing: May shower with protection. Primary Wound Dressing: Silver Collagen - moisten with normal saline or KY jelly Secondary Dressing: Wound #4 Right T Great: oe Foam - foam donut Dry Gauze Off-Loading: Wound #4 Right T Great: oe T Contact Cast to Right Lower Extremity otal 1. The patient states he tolerated the total contact cast well. 2. The cast was changed there were no obvious issues. We will see him again for wound examination early next week Electronic Signature(s) Signed: 04/14/2020 6:16:54 PM By: Baltazar Najjar MD Entered By: Baltazar Najjar on 04/14/2020 12:45:09 -------------------------------------------------------------------------------- Total Contact Cast Details Patient Name: Date of Service: [name], [name] 04/14/2020 10:15 A M Medical Record Number: [id number] Patient Account Number: [id number] Date of Birth/Sex: Treating RN: 01/25/1956 (64 y.o. [name] Primary Care Provider: Cynda Collier Other Clinician: Referring Provider: Treating Provider/Extender: [name] in Treatment: 11 T Contact Cast Applied for Wound Assessment: otal Wound #4 Right T Great oe Performed By: Physician Maxwell Caul., MD Post Procedure Diagnosis Same as Pre-procedure Electronic Signature(s) Signed: 04/14/2020 6:16:54 PM By: Baltazar Najjar MD Entered By: Baltazar Najjar on 04/14/2020 12:44:10 -------------------------------------------------------------------------------- SuperBill Details Patient Name: Date of Service: [name], [name] 04/14/2020 Medical Record Number: [id number] Patient Account Number: [id number] Date of Birth/Sex: Treating RN: June 04, 1956  (64 y.o. Tammy Sours Primary Care Provider: Cynda Collier Other Clinician: Referring Provider: Treating Provider/Extender: [name] in Treatment: 11 Diagnosis Coding ICD-10 Codes Code Description [id number] Type 2 diabetes mellitus with foot  ulcer E11.42 Type 2 diabetes mellitus with diabetic polyneuropathy L97.512 Non-pressure chronic ulcer of other part of right foot with fat layer exposed Facility Procedures CPT4 Code: [id number] Description: [phone] - APPLY TOTAL CONTACT LEG CAST ICD-10 Diagnosis Description L97.512 Non-pressure chronic ulcer of other part of right foot with fat layer exposed Modifier: Quantity: 1 Physician Procedures : CPT4 Code Description Modifier [id number] - WC PHYS APPLY TOTAL CONTACT CAST ICD-10 Diagnosis Description L97.512 Non-pressure chronic ulcer of other part of right foot with fat layer exposed Quantity: 1 Electronic Signature(s) Signed: 04/14/2020 6:16:54 PM By: Baltazar Najjar MD Entered By: Baltazar Najjar on 04/14/2020 12:45:28

## 2020-04-18 ENCOUNTER — Encounter (HOSPITAL_BASED_OUTPATIENT_CLINIC_OR_DEPARTMENT_OTHER): Payer: 59 | Admitting: Internal Medicine

## 2020-04-18 ENCOUNTER — Other Ambulatory Visit: Payer: Self-pay

## 2020-04-18 DIAGNOSIS — E11621 Type 2 diabetes mellitus with foot ulcer: Secondary | ICD-10-CM | POA: Diagnosis not present

## 2020-04-18 NOTE — Progress Notes (Signed)
[name], [name] ([id number]) Visit Report for 04/18/2020 Arrival Information Details Patient Name: Date of Service: [place], [place] 04/18/2020 8:00 [place] Record Number: [id number] Patient Account Number: [id number] Date of Birth/Sex: Treating RN: 09-22-1955 (64 y.o. Ernestene Mention Primary Care Kareena Arrambide: Howie Ill Other Clinician: Referring Jazz Biddy: Treating Seve Monette/Extender: Aldine Contes in Treatment: 12 Visit Information History Since Last Visit Added or deleted any medications: No Patient Arrived: Ambulatory Any new allergies or adverse reactions: No Arrival Time: 08:18 Had a fall or experienced change in No Accompanied By: self activities of daily living that may affect Transfer Assistance: None risk of falls: Patient Identification Verified: Yes Signs or symptoms of abuse/neglect since last visito No Secondary Verification Process Completed: Yes Hospitalized since last visit: No Patient Requires Transmission-Based Precautions: No Implantable device outside of the clinic excluding No Patient Has Alerts: No cellular tissue based products placed in the center since last visit: Has Dressing in Place as Prescribed: Yes Has Footwear/Offloading in Place as Prescribed: Yes Right: T Contact Cast otal Pain Present Now: No Electronic Signature(s) Signed: 04/18/2020 4:56:50 PM By: Baruch Gouty RN, BSN Entered By: Baruch Gouty on 04/18/2020 08:20:49 -------------------------------------------------------------------------------- Encounter Discharge Information Details Patient Name: Date of Service: [place], [place] 04/18/2020 8:00 [place] Record Number: [id number] Patient Account Number: [id number] Date of Birth/Sex: Treating RN: 1955/12/16 (64 y.o. Marvis Repress Primary Care Michaeleen Down: Howie Ill Other Clinician: Referring Isatou Agredano: Treating Ranald Alessio/Extender: Aldine Contes in Treatment:  12 Encounter Discharge Information Items Post Procedure Vitals Discharge Condition: Stable Temperature (F): 97.7 Ambulatory Status: Ambulatory Pulse (bpm): 90 Discharge Destination: Home Respiratory Rate (breaths/min): 18 Transportation: Private Auto Blood Pressure (mmHg): 164/95 Accompanied By: self Schedule Follow-up Appointment: Yes Clinical Summary of Care: Patient Declined Electronic Signature(s) Signed: 04/18/2020 11:49:38 AM By: Kela Millin Entered By: Kela Millin on 04/18/2020 09:10:27 -------------------------------------------------------------------------------- Lower Extremity Assessment Details Patient Name: Date of Service: [place], [place] 04/18/2020 8:00 [place] Record Number: [id number] Patient Account Number: [id number] Date of Birth/Sex: Treating RN: September 22, 1955 (64 y.o. Ernestene Mention Primary Care Taleah Bellantoni: Howie Ill Other Clinician: Referring Havoc Sanluis: Treating Javed Cotto/Extender: Aldine Contes in Treatment: 12 Edema Assessment Assessed: Shirlyn Goltz: No] Patrice Paradise: No] Edema: [Left: N] [Right: o] Calf Left: Right: Point of Measurement: From Medial Instep 38 cm Ankle Left: Right: Point of Measurement: From Medial Instep 22.5 cm Vascular Assessment Pulses: Dorsalis Pedis Palpable: [Right:Yes] Electronic Signature(s) Signed: 04/18/2020 4:56:50 PM By: Baruch Gouty RN, BSN Entered By: Baruch Gouty on 04/18/2020 08:35:48 -------------------------------------------------------------------------------- Multi Wound Chart Details Patient Name: Date of Service: Metro Kung, [place] 04/18/2020 8:00 A M Medical Record Number: [id number] Patient Account Number: [id number] Date of Birth/Sex: Treating RN: 01/10/1956 (64 y.o. Hessie Diener Primary Care Ketina Mars: Howie Ill Other Clinician: Referring Carlas Vandyne: Treating Ariahna Smiddy/Extender: Aldine Contes in Treatment: 12 Vital  Signs Height(in): 70 Capillary Blood Glucose(mg/dl): 162 Weight(lbs): 222 Pulse(bpm): 59 Body Mass Index(BMI): 74 Blood Pressure(mmHg): 164/95 Temperature(F): 97.7 Respiratory Rate(breaths/min): 18 Photos: [4:No Photos Right T Great oe] [N/A:N/A N/A] Wound Location: [4:Blister] [N/A:N/A] Wounding Event: [4:Diabetic Wound/Ulcer of the Lower] [N/A:N/A] Primary Etiology: [4:Extremity Cataracts, Hypertension, Type II] [N/A:N/A] Comorbid History: [4:Diabetes, Neuropathy, Confinement Anxiety 12/24/2019] [N/A:N/A] Date Acquired: [4:12] [N/A:N/A] Weeks of Treatment: [4:Open] [N/A:N/A] Wound Status: [4:0.5x0.4x0.4] [N/A:N/A] Measurements L x W x D (cm) [4:0.157] [N/A:N/A] A (cm) : rea [4:0.063] [N/A:N/A] Volume (cm) : [4:-11.30%] [N/A:N/A] % Reduction in A rea: [4:11.30%] [N/A:N/A] % Reduction  in Volume: [4:12] Starting Position 1 (o'clock): [4:12] Ending Position 1 (o'clock): [4:0.3] Maximum Distance 1 (cm): [4:Yes] [N/A:N/A] Undermining: [4:Grade 2] [N/A:N/A] Classification: [4:Small] [N/A:N/A] Exudate A mount: [4:Serosanguineous] [N/A:N/A] Exudate Type: [4:red, brown] [N/A:N/A] Exudate Color: [4:Well defined, not attached] [N/A:N/A] Wound Margin: [4:Large (67-100%)] [N/A:N/A] Granulation A mount: [4:Red] [N/A:N/A] Granulation Quality: [4:None Present (0%)] [N/A:N/A] Necrotic A mount: [4:Fat Layer (Subcutaneous Tissue): Yes N/A] Exposed Structures: [4:Fascia: No Tendon: No Muscle: No Joint: No Bone: No None] [N/A:N/A] Epithelialization: [4:Debridement - Excisional] [N/A:N/A] Debridement: Pre-procedure Verification/Time Out 08:40 [N/A:N/A] Taken: [4:Lidocaine 4% Topical Solution] [N/A:N/A] Pain Control: [4:Subcutaneous] [N/A:N/A] Tissue Debrided: [4:Skin/Subcutaneous Tissue] [N/A:N/A] Level: [4:0.64] [N/A:N/A] Debridement A (sq cm): [4:rea Curette] [N/A:N/A] Instrument: [4:Minimum] [N/A:N/A] Bleeding: [4:Pressure] [N/A:N/A] Hemostasis A chieved: [4:0] [N/A:N/A] Procedural  Pain: [4:0] [N/A:N/A] Post Procedural Pain: [4:Procedure was tolerated well] [N/A:N/A] Debridement Treatment Response: [4:0.5x0.4x0.4] [N/A:N/A] Post Debridement Measurements L x W x D (cm) [4:0.063] [N/A:N/A] Post Debridement Volume: (cm) [4:Debridement] [N/A:N/A] Procedures Performed: [4:T Contact Cast otal] Treatment Notes Electronic Signature(s) Signed: 04/18/2020 4:22:08 PM By: Linton Ham MD Signed: 04/18/2020 5:04:07 PM By: Deon Pilling Entered By: Linton Ham on 04/18/2020 08:55:09 -------------------------------------------------------------------------------- Multi-Disciplinary Care Plan Details Patient Name: Date of Service: [place], [place] 04/18/2020 8:00 [place] Record Number: [id number] Patient Account Number: [id number] Date of Birth/Sex: Treating RN: 12/17/1955 (64 y.o. Lorette Ang, Meta.Reding Primary Care Saje Gallop: Howie Ill Other Clinician: Referring Kyrie Fludd: Treating Judas Mohammad/Extender: Aldine Contes in Treatment: 12 Active Inactive Wound/Skin Impairment Nursing Diagnoses: Knowledge deficit related to ulceration/compromised skin integrity Goals: Patient/caregiver will verbalize understanding of skin care regimen Date Initiated: 01/25/2020 Target Resolution Date: 05/18/2020 Goal Status: Active Ulcer/skin breakdown will have a volume reduction of 30% by week 4 Date Initiated: 01/25/2020 Date Inactivated: 04/04/2020 Target Resolution Date: 03/17/2020 Goal Status: Met Ulcer/skin breakdown will have a volume reduction of 50% by week 8 Date Initiated: 04/04/2020 Date Inactivated: 04/11/2020 Target Resolution Date: 04/17/2020 Goal Status: Met Ulcer/skin breakdown will have a volume reduction of 80% by week 12 Date Initiated: 04/11/2020 Target Resolution Date: 05/18/2020 Goal Status: Active Interventions: Assess patient/caregiver ability to obtain necessary supplies Assess patient/caregiver ability to perform ulcer/skin  care regimen upon admission and as needed Assess ulceration(s) every visit Notes: Electronic Signature(s) Signed: 04/18/2020 5:04:07 PM By: Deon Pilling Entered By: Deon Pilling on 04/18/2020 08:19:59 -------------------------------------------------------------------------------- Pain Assessment Details Patient Name: Date of Service: [place], [place] 04/18/2020 8:00 [place] Record Number: [id number] Patient Account Number: [id number] Date of Birth/Sex: Treating RN: Nov 17, 1955 (64 y.o. Ernestene Mention Primary Care Darshana Curnutt: Howie Ill Other Clinician: Referring Jaianna Nicoll: Treating Nadean Montanaro/Extender: Aldine Contes in Treatment: 12 Active Problems Location of Pain Severity and Description of Pain Patient Has Paino No Site Locations Rate the pain. Current Pain Level: 0 Pain Management and Medication Current Pain Management: Electronic Signature(s) Signed: 04/18/2020 4:56:50 PM By: Baruch Gouty RN, BSN Entered By: Baruch Gouty on 04/18/2020 08:22:09 -------------------------------------------------------------------------------- Patient/Caregiver Education Details Patient Name: Date of Service: Zenaida Deed RK 10/19/2021andnbsp8:00 A M Medical Record Number: [id number] Patient Account Number: [id number] Date of Birth/Gender: Treating RN: April 09, 1956 (64 y.o. Hessie Diener Primary Care Physician: Howie Ill Other Clinician: Referring Physician: Treating Physician/Extender: Aldine Contes in Treatment: 12 Education Assessment Education Provided To: Patient Education Topics Provided Wound/Skin Impairment: Handouts: Caring for Your Ulcer Methods: Explain/Verbal Responses: Reinforcements needed Electronic Signature(s) Signed: 04/18/2020 5:04:07 PM By: Deon Pilling Entered By: Deon Pilling on 04/18/2020  08:20:11 -------------------------------------------------------------------------------- Wound Assessment  Details Patient Name: Date of Service: [place], [place] 04/18/2020 8:00 [place] Record Number: [id number] Patient Account Number: [id number] Date of Birth/Sex: Treating RN: August 29, 1955 (64 y.o. Ernestene Mention Primary Care Brentley Horrell: Howie Ill Other Clinician: Referring Tianah Lonardo: Treating Brytney Somes/Extender: Aldine Contes in Treatment: 12 Wound Status Wound Number: 4 Primary Diabetic Wound/Ulcer of the Lower Extremity Etiology: Wound Location: Right T Great oe Wound Status: Open Wounding Event: Blister Comorbid Cataracts, Hypertension, Type II Diabetes, Neuropathy, Date Acquired: 12/24/2019 History: Confinement Anxiety Weeks Of Treatment: 12 Clustered Wound: No Wound Measurements Length: (cm) 0.5 Width: (cm) 0.4 Depth: (cm) 0.4 Area: (cm) 0.157 Volume: (cm) 0.063 % Reduction in Area: -11.3% % Reduction in Volume: 11.3% Epithelialization: None Tunneling: No Undermining: Yes Starting Position (o'clock): 12 Ending Position (o'clock): 12 Maximum Distance: (cm) 0.3 Wound Description Classification: Grade 2 Wound Margin: Well defined, not attached Exudate Amount: Small Exudate Type: Serosanguineous Exudate Color: red, brown Foul Odor After Cleansing: No Slough/Fibrino No Wound Bed Granulation Amount: Large (67-100%) Exposed Structure Granulation Quality: Red Fascia Exposed: No Necrotic Amount: None Present (0%) Fat Layer (Subcutaneous Tissue) Exposed: Yes Tendon Exposed: No Muscle Exposed: No Joint Exposed: No Bone Exposed: No Treatment Notes Wound #4 (Right Toe Great) 1. Cleanse With Wound Cleanser Soap and water 3. Primary Dressing Applied Collegen AG 4. Secondary Dressing Dry Gauze Foam 5. Secured With Tape 7. Footwear/Offloading device applied T Contact Cast otal Notes foam donut as secondary. last layer  of TCC applied by MD. Electronic Signature(s) Signed: 04/18/2020 4:56:50 PM By: Baruch Gouty RN, BSN Entered By: Baruch Gouty on 04/18/2020 08:34:12 -------------------------------------------------------------------------------- [place] Details Patient Name: Date of Service: Metro Kung, [place] 04/18/2020 8:00 [place] Record Number: [id number] Patient Account Number: [id number] Date of Birth/Sex: Treating RN: October 02, 1955 (64 y.o. Ernestene Mention Primary Care [place] Gipe: Howie Ill Other Clinician: Referring Queen Abbett: Treating Ismelda Weatherman/Extender: Aldine Contes in Treatment: 12 Vital Signs Time Taken: 08:20 Temperature (F): 97.7 Height (in): 70 Pulse (bpm): 90 Source: Stated Respiratory Rate (breaths/min): 18 Weight (lbs): 222 Blood Pressure (mmHg): 164/95 Source: Stated Capillary Blood Glucose (mg/dl): 162 Body Mass Index (BMI): 31.9 Reference Range: 80 - 120 mg / dl Notes glucose per pt report this am Electronic Signature(s) Signed: 04/18/2020 4:56:50 PM By: Baruch Gouty RN, BSN Entered By: Baruch Gouty on 04/18/2020 08:21:55

## 2020-04-18 NOTE — Progress Notes (Signed)
[name], [name] ([id number]) Visit Report for 04/18/2020 Debridement Details Patient Name: Date of Service: [place], [name] 04/18/2020 8:00 A M Medical Record Number: [id number] Patient Account Number: [id number] Date of Birth/Sex: Treating RN: [birth year] (64 y.o. [name], [place]oa Primary Care Provider: Cynda Familia Other Clinician: Referring Provider: Treating Provider/Extender: Willia Craze in Treatment: 12 Debridement Performed for Assessment: Wound #4 Right T Great oe Performed By: Physician Maxwell Caul., MD Debridement Type: Debridement Severity of Tissue Pre Debridement: Fat layer exposed Level of Consciousness (Pre-procedure): Awake and Alert Pre-procedure Verification/Time Out Yes - 08:40 Taken: Start Time: 08:41 Pain Control: Lidocaine 4% T opical Solution T Area Debrided (L x W): otal 0.8 (cm) x 0.8 (cm) = 0.64 (cm) Tissue and other material debrided: Viable, Non-Viable, Subcutaneous, Skin: Dermis , Skin: Epidermis, Fibrin/Exudate Level: Skin/Subcutaneous Tissue Debridement Description: Excisional Instrument: Curette Bleeding: Minimum Hemostasis Achieved: Pressure End Time: 08:44 Procedural Pain: 0 Post Procedural Pain: 0 Response to Treatment: Procedure was tolerated well Level of Consciousness (Post- Awake and Alert procedure): Post Debridement Measurements of Total Wound Length: (cm) 0.5 Width: (cm) 0.4 Depth: (cm) 0.4 Volume: (cm) 0.063 Character of Wound/Ulcer Post Debridement: Improved Severity of Tissue Post Debridement: Fat layer exposed Post Procedure Diagnosis Same as Pre-procedure Electronic Signature(s) Signed: 04/18/2020 4:22:08 PM By: Baltazar Najjar MD Signed: 04/18/2020 5:04:07 PM By: Shawn Stall Entered By: Baltazar Najjar on 04/18/2020 08:57:14 -------------------------------------------------------------------------------- HPI Details Patient Name: Date of Service: Todd Price, [name] 04/18/2020 8:00 A  M Medical Record Number: [id number] Patient Account Number: [id number] Date of Birth/Sex: Treating RN: [birth year] (64 y.o. Tammy Sours Primary Care Provider: Cynda Familia Other Clinician: Referring Provider: Treating Provider/Extender: Willia Craze in Treatment: 12 History of Present Illness HPI Description: 12/16/18 on evaluation today patient presents for initial evaluation our clinic as a self-referral due to a wound that is had on his right first toe for 6-8 weeks. He states his last hemoglobin A1c which was roughly 4 months ago was 11.0 he knows he has not been taking care of himself in this regard. He's actually been out of medications for two weeks both in regard to his diabetes as well as his high blood pressure. He states he is going to see those doctors in the next week. Subsequently he actually told me that being here in the clinic and seeing so many patients that did have applications in such has really got him thinking about the fact that he needs to take better care of himself in this regard. Fortunately there's no signs of active infection at this time which is good news. No fevers, chills, nausea, or vomiting noted at this time. He has been seen a local podiatrist for the past 6-8 weeks he was recommending for the patient that he soak in Epsom salts and come in on the patient tells me generally Tuesday and Thursday for debridement's and otherwise was told to leave the wound open to air just put a sock on and go about his business and that "it would scab up and be okay". With that being said the patient states when he realized things were not getting better and he really did not agree with the treatment plan he decided to get a second opinion here the wound care center. Apparently he's had x-rays although the patient's podiatrist has not sent [place] records at this point I think we may want to send them to the hospital for an x-ray just to have  everything documented before we see about what we need to do from the standpoint of more aggressive treatment such as potentially casting all over this be his right foot that's gonna be somewhat difficult in my pinion. 12/23/18 on evaluation today patient actually appears to be doing okay in regard to his plantar foot ulcer. Unfortunately he was not able to go get the x-ray during the last visit here in the office. Subsequently he states that he actually forgot to go do that in the interim between last week and this week. That is something is gonna go do as soon as he leaves here today. We discussed again that I do think the total contact cast would be the appropriate way to go in the best way to get the news to heal. With that being said beginning the x-ray clear before we proceed with the cast he understands. 6/26; patient here for obligatory total contact cast change no complaints 12/30/18 on evaluation today patient actually appears to be doing excellent in regard to his plantar foot wound. This is looking tremendously better there's new callous buildup it's not as deep and overall show signs of excellent improvement. I'm very pleased in this regard. Overall I think that he should continue with the Current wound care measures including the total contact cast as I believe this is gonna be most helpful in getting this to heal very quickly. 01/06/19 on evaluation today patient actually appears to be doing quite well with regard to his toe ulcer. This is shown signs of good improvement in fact is measuring smaller by about half of what it was last week. Nonetheless I think this is excellent improvement is filled in an overall is making great progress. No fevers, chills, nausea, or vomiting noted at this time. 7/16; patient is here for review of a wound on the plantar aspect of his right great toe. Been using silver collagen under a total contact cast 01/20/19 on evaluation today patient actually presents for  a slightly early follow-up due to the fact that his cast has been rubbing on the right medial ankle region. Unfortunately he has a new wound at this location because of the rubbing of the cast. There does not appear to be signs of active infection which is good news. No fevers, chills, nausea, or vomiting noted at this time. Overall been pleased with how things have progressed in regard to his toe again today this appears to be doing well. 02/01/19-Patient returns at 12 days being out of the cast on account of going to the beach, he has been using Prisma to his wounds on his right medial ankle and right great toe plantar aspect. Both appear to be around the same dimensions. 02/08/19-Patient returns at 1 week after being in a cast both the wounds are better in fact the right great plantar wound is healed, right medial ankle wound looks better. We are using Prisma. Does not need to be in cast anymore 8/24; 2-week follow-up. Everything is healed here including the right great plantar toe and the cast injury on the medial ankle. He still has callus on the plantar right great toe. He has gone to [employer] I believe and has custom inserts at least. His wounds have closed over READMISSION 01/25/2020. This is a 64 year old man with poorly controlled type 2 diabetes with a recent hemoglobin A1c of 9.3. He also has diabetic neuropathy. We had him here extensively in 2020 again for a wound on the plantar aspect of the right  great toe. Eventually we heal this over and a total contact cast. He got custom inserts for his running shoes at [employer]. He has been using them reliably. He states that roughly 3 to 4 weeks ago he developed a thick callus on the bottom of the toe it eventually separated or split and underneath was an open wound. He saw his primary doctor who would and put him on doxycycline for 10 days. Past medical history type 2 diabetes with peripheral neuropathy, hyperlipidemia and hypertension. ABI in our  clinic was 1.17 on the right 02/11/20-Patient back at 2 weeks, right great toe plantar wound is about the same with slight callus build up. 8/27; 2-week follow-up right. Aggressively debrided last time considerable callus buildup. We have been using silver collagen. This is definitely going to require a total contact cast however a week today his daughter gets married we elected to delay that until the Monday after that I E 10 days from now. 9/7; right plantar toe. Aggressively debrided last time and the wound actually looks some better with less depth. 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 In view of this I did not put a total contact cast on. Also noted he is having marked indentation of his right great toe in the surgical shoe he is wearing 9/14; right plantar great toe. Surface of the wound looks healthy again he has overriding skin. He is leaving for [place] on vacation on 9/24, after this I think he will require a total contact cast. We started him off on silver collagen for some reason he changed to silver alginate I am going to change him back to silver collagen today 10/5; right plantar great toe. He is returned from [place] he was not offloading this at all I am not exactly sure whether he was even dressing this. Arrives with extensive undermining from the wound on the plantar aspect of the right great toe. He has a surgical shoe but nothing else no felt etc. 10/12; right plantar great toe. The wound does not look unhealthy however its has thick edges overhanging skin. He is ready for a total contact cast. Still using silver collagen as the primary dressing 10/15; the patient is here for the obligatory first total contact cast change. This was done in the standard fashion the wound was not examined 10/19 wound bed looks smaller but still some depth. Healthy granulation. Using silver collagen under a total contact  cast Electronic Signature(s) Signed: 04/18/2020 4:22:08 PM By: Baltazar Najjar MD Entered By: Baltazar Najjar on 04/18/2020 08:57:48 -------------------------------------------------------------------------------- Physical Exam Details Patient Name: Date of Service: Todd Price, [name] 04/18/2020 8:00 A M Medical Record Number: [id number] Patient Account Number: [id number] Date of Birth/Sex: Treating RN: 08-17-1955 (64 y.o. Tammy Sours Primary Care Provider: Cynda Familia Other Clinician: Referring Provider: Treating Provider/Extender: Willia Craze in Treatment: 12 Constitutional Patient is hypertensive.. Pulse regular and within target range for patient.Marland Kitchen Respirations regular, non-labored and within target range.. Temperature is normal and within the target range for the patient.Marland Kitchen Appears in no distress. Notes Wound exam; right plantar great toe. Surface of the wound continues to look healthy although he has a rim of skin coming from one edge of the wound that does not have underlying granulation. Once again I debrided this removing skin and subcutaneous tissue. Electronic Signature(s) Signed: 04/18/2020 4:22:08 PM By: Baltazar Najjar MD Entered By: Baltazar Najjar on  04/18/2020 08:58:35 -------------------------------------------------------------------------------- Physician Orders Details Patient Name: Date of Service: [place], [name] 04/18/2020 8:00 A M Medical Record Number: [id number] Patient Account Number: [id number] Date of Birth/Sex: Treating RN: 1955/09/04 (64 y.o. Tammy Sours Primary Care Provider: Cynda Familia Other Clinician: Referring Provider: Treating Provider/Extender: Willia Craze in Treatment: 12 Verbal / Phone Orders: No Diagnosis Coding ICD-10 Coding Code Description E11.621 Type 2 diabetes mellitus with foot ulcer E11.42 Type 2 diabetes mellitus with diabetic polyneuropathy L97.512  Non-pressure chronic ulcer of other part of right foot with fat layer exposed Follow-up Appointments ppointment in 1 week. - tuesday Return A Dressing Change Frequency Do not change entire dressing for one week. Wound Cleansing May shower with protection. Primary Wound Dressing Wound #4 Right T Great oe Silver Collagen - moisten with normal saline or [place] jelly Secondary Dressing Wound #4 Right T Great oe Foam - foam donut Dry Gauze Off-Loading Wound #4 Right T Great oe Total Contact Cast to Right Lower Extremity Electronic Signature(s) Signed: 04/18/2020 4:22:08 PM By: Baltazar Najjar MD Signed: 04/18/2020 5:04:07 PM By: Shawn Stall Entered By: Shawn Stall on 04/18/2020 08:45:16 -------------------------------------------------------------------------------- Problem List Details Patient Name: Date of Service: Todd Price, [name] 04/18/2020 8:00 A M Medical Record Number: [id number] Patient Account Number: [id number] Date of Birth/Sex: Treating RN: 21-Jun-1956 (64 y.o. Tammy Sours Primary Care Provider: Cynda Familia Other Clinician: Referring Provider: Treating Provider/Extender: Willia Craze in Treatment: 12 Active Problems ICD-10 Encounter Code Description Active Date MDM Diagnosis E11.621 Type 2 diabetes mellitus with foot ulcer 01/25/2020 No Yes E11.42 Type 2 diabetes mellitus with diabetic polyneuropathy 01/25/2020 No Yes L97.512 Non-pressure chronic ulcer of other part of right foot with fat layer exposed 01/25/2020 No Yes Inactive Problems Resolved Problems Electronic Signature(s) Signed: 04/18/2020 4:22:08 PM By: Baltazar Najjar MD Entered By: Baltazar Najjar on 04/18/2020 08:55:03 -------------------------------------------------------------------------------- Progress Note Details Patient Name: Date of Service: Todd Price, [name] 04/18/2020 8:00 A M Medical Record Number: [id number] Patient Account Number: [id number] Date of  Birth/Sex: Treating RN: Oct 27, 1955 (64 y.o. Tammy Sours Primary Care Provider: Cynda Familia Other Clinician: Referring Provider: Treating Provider/Extender: Willia Craze in Treatment: 12 Subjective History of Present Illness (HPI) 12/16/18 on evaluation today patient presents for initial evaluation our clinic as a self-referral due to a wound that is had on his right first toe for 6-8 weeks. He states his last hemoglobin A1c which was roughly 4 months ago was 11.0 he knows he has not been taking care of himself in this regard. He's actually been out of medications for two weeks both in regard to his diabetes as well as his high blood pressure. He states he is going to see those doctors in the next week. Subsequently he actually told me that being here in the clinic and seeing so many patients that did have applications in such has really got him thinking about the fact that he needs to take better care of himself in this regard. Fortunately there's no signs of active infection at this time which is good news. No fevers, chills, nausea, or vomiting noted at this time. He has been seen a local podiatrist for the past 6-8 weeks he was recommending for the patient that he soak in Epsom salts and come in on the patient tells me generally Tuesday and Thursday for debridement's and otherwise was told to leave the wound open to air just put a sock on and go  about his business and that "it would scab up and be okay". With that being said the patient states when he realized things were not getting better and he really did not agree with the treatment plan he decided to get a second opinion here the wound care center. Apparently he's had x-rays although the patient's podiatrist has not sent [place] records at this point I think we may want to send them to the hospital for an x-ray just to have everything documented before we see about what we need to do from the standpoint of  more aggressive treatment such as potentially casting all over this be his right foot that's gonna be somewhat difficult in my pinion. 12/23/18 on evaluation today patient actually appears to be doing okay in regard to his plantar foot ulcer. Unfortunately he was not able to go get the x-ray during the last visit here in the office. Subsequently he states that he actually forgot to go do that in the interim between last week and this week. That is something is gonna go do as soon as he leaves here today. We discussed again that I do think the total contact cast would be the appropriate way to go in the best way to get the news to heal. With that being said beginning the x-ray clear before we proceed with the cast he understands. 6/26; patient here for obligatory total contact cast change no complaints 12/30/18 on evaluation today patient actually appears to be doing excellent in regard to his plantar foot wound. This is looking tremendously better there's new callous buildup it's not as deep and overall show signs of excellent improvement. I'm very pleased in this regard. Overall I think that he should continue with the Current wound care measures including the total contact cast as I believe this is gonna be most helpful in getting this to heal very quickly. 01/06/19 on evaluation today patient actually appears to be doing quite well with regard to his toe ulcer. This is shown signs of good improvement in fact is measuring smaller by about half of what it was last week. Nonetheless I think this is excellent improvement is filled in an overall is making great progress. No fevers, chills, nausea, or vomiting noted at this time. 7/16; patient is here for review of a wound on the plantar aspect of his right great toe. Been using silver collagen under a total contact cast 01/20/19 on evaluation today patient actually presents for a slightly early follow-up due to the fact that his cast has been rubbing on the  right medial ankle region. Unfortunately he has a new wound at this location because of the rubbing of the cast. There does not appear to be signs of active infection which is good news. No fevers, chills, nausea, or vomiting noted at this time. Overall been pleased with how things have progressed in regard to his toe again today this appears to be doing well. 02/01/19-Patient returns at 12 days being out of the cast on account of going to the beach, he has been using Prisma to his wounds on his right medial ankle and right great toe plantar aspect. Both appear to be around the same dimensions. 02/08/19-Patient returns at 1 week after being in a cast both the wounds are better in fact the right great plantar wound is healed, right medial ankle wound looks better. We are using Prisma. Does not need to be in cast anymore 8/24; 2-week follow-up. Everything is healed here including the  right great plantar toe and the cast injury on the medial ankle. He still has callus on the plantar right great toe. He has gone to [employer] I believe and has custom inserts at least. His wounds have closed over READMISSION 01/25/2020. This is a 64 year old man with poorly controlled type 2 diabetes with a recent hemoglobin A1c of 9.3. He also has diabetic neuropathy. We had him here extensively in 2020 again for a wound on the plantar aspect of the right great toe. Eventually we heal this over and a total contact cast. He got custom inserts for his running shoes at [employer]. He has been using them reliably. He states that roughly 3 to 4 weeks ago he developed a thick callus on the bottom of the toe it eventually separated or split and underneath was an open wound. He saw his primary doctor who would and put him on doxycycline for 10 days. Past medical history type 2 diabetes with peripheral neuropathy, hyperlipidemia and hypertension. ABI in our clinic was 1.17 on the right 02/11/20-Patient back at 2 weeks, right great toe  plantar wound is about the same with slight callus build up. 8/27; 2-week follow-up right. Aggressively debrided last time considerable callus buildup. We have been using silver collagen. This is definitely going to require a total contact cast however a week today his daughter gets married we elected to delay that until the Monday after that I E 10 days from now. 9/7; right plantar toe. Aggressively debrided last time and the wound actually looks some better with less depth. 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 In view of this I did not put a total contact cast on. Also noted he is having marked indentation of his right great toe in the surgical shoe he is wearing 9/14; right plantar great toe. Surface of the wound looks healthy again he has overriding skin. He is leaving for [place] on vacation on 9/24, after this I think he will require a total contact cast. We started him off on silver collagen for some reason he changed to silver alginate I am going to change him back to silver collagen today 10/5; right plantar great toe. [place] is [place] from [place] he was not offloading this at all I am not exactly sure whether he was even dressing this. Arrives with extensive undermining from the wound on the plantar aspect of the right great toe. He has a surgical shoe but nothing else no felt etc. 10/12; right plantar great toe. The wound does not look unhealthy however its has thick edges overhanging skin. He is ready for a total contact cast. Still using silver collagen as the primary dressing 10/15; the patient is here for the obligatory first total contact cast change. This was done in the standard fashion the wound was not examined 10/19 wound bed looks smaller but still some depth. Healthy granulation. Using silver collagen under a total contact cast Objective Constitutional Patient is hypertensive.. Pulse regular and within  target range for patient.Marland Kitchen. Respirations regular, non-labored and within target range.. Temperature is normal and within the target range for the patient.Marland Kitchen. Appears in no distress. Vitals Time Taken: 8:20 AM, Height: 70 in, Source: Stated, Weight: 222 lbs, Source: Stated, BMI: 31.9, Temperature: 97.7 F, Pulse: 90 bpm, Respiratory Rate: 18 breaths/min, Blood Pressure: 164/95 mmHg, Capillary Blood Glucose: 162 mg/dl. General Notes: glucose per pt report this am General Notes: Wound exam; right  plantar great toe. Surface of the wound continues to look healthy although he has a rim of skin coming from one edge of the wound that does not have underlying granulation. Once again I debrided this removing skin and subcutaneous tissue. Integumentary (Hair, Skin) Wound #4 status is Open. Original cause of wound was Blister. The wound is located on the Right T Great. The wound measures 0.5cm length x 0.4cm width oe x 0.4cm depth; 0.157cm^2 area and 0.063cm^3 volume. There is Fat Layer (Subcutaneous Tissue) exposed. There is no tunneling noted, however, there is undermining starting at 12:00 and ending at 12:00 with a maximum distance of 0.3cm. There is a small amount of serosanguineous drainage noted. The wound margin is well defined and not attached to the wound base. There is large (67-100%) red granulation within the wound bed. There is no necrotic tissue within the wound bed. Assessment Active Problems ICD-10 Type 2 diabetes mellitus with foot ulcer Type 2 diabetes mellitus with diabetic polyneuropathy Non-pressure chronic ulcer of other part of right foot with fat layer exposed Procedures Wound #4 Pre-procedure diagnosis of Wound #4 is a Diabetic Wound/Ulcer of the Lower Extremity located on the Right T Great .Severity of Tissue Pre Debridement is: oe Fat layer exposed. There was a Excisional Skin/Subcutaneous Tissue Debridement with a total area of 0.64 sq cm performed by Maxwell Caul., MD.  With the following instrument(s): Curette to remove Viable and Non-Viable tissue/material. Material removed includes Subcutaneous Tissue, Skin: Dermis, Skin: Epidermis, and Fibrin/Exudate after achieving pain control using Lidocaine 4% Topical Solution. A time out was conducted at 08:40, prior to the start of the procedure. A Minimum amount of bleeding was controlled with Pressure. The procedure was tolerated well with a pain level of 0 throughout and a pain level of 0 following the procedure. Post Debridement Measurements: 0.5cm length x 0.4cm width x 0.4cm depth; 0.063cm^3 volume. Character of Wound/Ulcer Post Debridement is improved. Severity of Tissue Post Debridement is: Fat layer exposed. Post procedure Diagnosis Wound #4: Same as Pre-Procedure Pre-procedure diagnosis of Wound #4 is a Diabetic Wound/Ulcer of the Lower Extremity located on the Right T Great . There was a T Contact Cast oe otal Procedure by Maxwell Caul., MD. Post procedure Diagnosis Wound #4: Same as Pre-Procedure Plan Follow-up Appointments: Return Appointment in 1 week. - tuesday Dressing Change Frequency: Do not change entire dressing for one week. Wound Cleansing: May shower with protection. Primary Wound Dressing: Wound #4 Right T Great: oe Silver Collagen - moisten with normal saline or KY jelly Secondary Dressing: Wound #4 Right T Great: oe Foam - foam donut Dry Gauze Off-Loading: Wound #4 Right T Great: oe T Contact Cast to Right Lower Extremity otal 1. Right great toe still silver collagen under a total contact cast 2. I think the wound area is too small to consider an advanced treatment product Electronic Signature(s) Signed: 04/18/2020 4:22:08 PM By: Baltazar Najjar MD Entered By: Baltazar Najjar on 04/18/2020 08:59:11 -------------------------------------------------------------------------------- Total Contact Cast Details Patient Name: Date of Service: Todd Price, [name] 04/18/2020 8:00 A  M Medical Record Number: [id number] Patient Account Number: [id number] Date of Birth/Sex: Treating RN: 1956/02/24 (64 y.o. Tammy Sours Primary Care Provider: Cynda Familia Other Clinician: Referring Provider: Treating Provider/Extender: Willia Craze in Treatment: 12 T Contact Cast Applied for Wound Assessment: otal Wound #4 Right T Great oe Performed By: Physician Maxwell Caul., MD Post Procedure Diagnosis Same as Pre-procedure Electronic Signature(s) Signed: 04/18/2020 4:22:08 PM  By: Baltazar Najjar MD Signed: 04/18/2020 5:04:07 PM By: Shawn Stall Entered By: Shawn Stall on 04/18/2020 08:44:44 -------------------------------------------------------------------------------- SuperBill Details Patient Name: Date of Service: Todd Price, [name] 04/18/2020 Medical Record Number: [id number] Patient Account Number: [id number] Date of Birth/Sex: Treating RN: 02/25/1956 (64 y.o. 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

## 2020-04-25 ENCOUNTER — Other Ambulatory Visit: Payer: Self-pay

## 2020-04-25 ENCOUNTER — Encounter (HOSPITAL_BASED_OUTPATIENT_CLINIC_OR_DEPARTMENT_OTHER): Payer: 59 | Admitting: Internal Medicine

## 2020-04-25 DIAGNOSIS — E11621 Type 2 diabetes mellitus with foot ulcer: Secondary | ICD-10-CM | POA: Diagnosis not present

## 2020-04-27 NOTE — Progress Notes (Signed)
GARV, KUECHLE ([id number]) Visit Report for 04/25/2020 Arrival Information Details Patient Name: Date of Service: [name], [name] [place] 04/25/2020 8:30 A M Medical Record Number: [id number] Patient Account Number: [id number] Date of Birth/Sex: Treating RN: 09-24-1955 (64 y.o. Hessie Diener Primary Care Keta [place]: Howie Ill Other Clinician: Referring Ervie Mccard: Treating Brighton Delio/Extender: Aldine Contes in Treatment: 63 Visit Information History Since Last Visit Added or deleted any medications: No Patient Arrived: Ambulatory Any new allergies or adverse reactions: No Arrival Time: 08:03 Had a fall or experienced change in No Accompanied By: self activities of daily living that may affect Transfer Assistance: None risk of falls: Patient Identification Verified: Yes Signs or symptoms of abuse/neglect since last visito No Secondary Verification Process Completed: Yes Hospitalized since last visit: No Patient Requires Transmission-Based Precautions: No Implantable device outside of the clinic excluding No Patient Has Alerts: No cellular tissue based products placed in the center since last visit: Has Dressing in Place as Prescribed: Yes Has Footwear/Offloading in Place as Prescribed: Yes Right: T Contact Cast otal Pain Present Now: No Electronic Signature(s) Signed: 04/27/2020 5:24:43 PM By: Deon Pilling Entered By: Deon Pilling on 04/25/2020 08:09:14 -------------------------------------------------------------------------------- Encounter Discharge Information Details Patient Name: Date of Service: [name], [name] [place] 04/25/2020 8:30 A M Medical Record Number: [id number] Patient Account Number: [id number] Date of Birth/Sex: Treating RN: [birth year] (64 y.o. Hessie Diener Primary Care Micala Saltsman: Howie Ill Other Clinician: Referring Bishop Vanderwerf: Treating Dariusz Brase/Extender: Aldine Contes in Treatment: 13 Encounter  Discharge Information Items Discharge Condition: Stable Ambulatory Status: Ambulatory Discharge Destination: Home Transportation: Private Auto Accompanied By: self Schedule Follow-up Appointment: Yes Clinical Summary of Care: Electronic Signature(s) Signed: 04/27/2020 5:24:43 PM By: Deon Pilling Entered By: Deon Pilling on 04/25/2020 08:18:47 -------------------------------------------------------------------------------- Lower Extremity Assessment Details Patient Name: Date of Service: [name], [name] [place] 04/25/2020 8:30 A M Medical Record Number: [id number] Patient Account Number: [id number] Date of Birth/Sex: Treating RN: 1955/08/16 (64 y.o. Hessie Diener Primary Care Kensley Valladares: Howie Ill Other Clinician: Referring Loys Shugars: Treating Jahziel Sinn/Extender: Aldine Contes in Treatment: 13 Edema Assessment Assessed: Shirlyn Goltz: No] Patrice Paradise: Yes] Edema: [Left: N] [Right: o] Calf Left: Right: Point of Measurement: From Medial Instep 37 cm Ankle Left: Right: Point of Measurement: From Medial Instep 22 cm Vascular Assessment Pulses: Dorsalis Pedis Palpable: [Right:Yes] Electronic Signature(s) Signed: 04/27/2020 5:24:43 PM By: Deon Pilling Entered By: Deon Pilling on 04/25/2020 08:10:57 -------------------------------------------------------------------------------- Multi Wound Chart Details Patient Name: Date of Service: [name], [name] [place] 04/25/2020 8:30 A M Medical Record Number: [id number] Patient Account Number: [id number] Date of Birth/Sex: Treating RN: 1956-01-05 (64 y.o. Lorette Ang, Meta.Reding Primary Care Vallerie Hentz: Howie Ill Other Clinician: Referring Greggory Safranek: Treating Denese Mentink/Extender: Aldine Contes in Treatment: 13 Vital Signs Height(in): 70 Capillary Blood Glucose(mg/dl): 162 Weight(lbs): 222 Pulse(bpm): 32 Body Mass Index(BMI): 32 Blood Pressure(mmHg): 171/85 Temperature(F): 97.8 Respiratory  Rate(breaths/min): 16 Photos: [4:No Photos Right T Great oe] [N/A:N/A N/A] Wound Location: [4:Blister] [N/A:N/A] Wounding Event: [4:Diabetic Wound/Ulcer of the Lower] [N/A:N/A] Primary Etiology: [4:Extremity Cataracts, Hypertension, Type II] [N/A:N/A] Comorbid History: [4:Diabetes, Neuropathy, Confinement Anxiety 12/24/2019] [N/A:N/A] Date Acquired: [4:13] [N/A:N/A] Weeks of Treatment: [4:Open] [N/A:N/A] Wound Status: [4:0.2x0.2x0.1] [N/A:N/A] Measurements L x W x D (cm) [4:0.031] [N/A:N/A] A (cm) : rea [4:0.003] [N/A:N/A] Volume (cm) : [4:78.00%] [N/A:N/A] % Reduction in A rea: [4:95.80%] [N/A:N/A] % Reduction in Volume: [4:Grade 2] [N/A:N/A] Classification: [4:Small] [N/A:N/A] Exudate A mount: [4:Serosanguineous] [N/A:N/A] Exudate Type: [4:red, brown] [N/A:N/A] Exudate Color: [4:Well defined, not  attached] [N/A:N/A] Wound Margin: [4:Large (67-100%)] [N/A:N/A] Granulation A mount: [4:Red] [N/A:N/A] Granulation Quality: [4:None Present (0%)] [N/A:N/A] Necrotic A mount: [4:Fat Layer (Subcutaneous Tissue): Yes N/A] Exposed Structures: [4:Fascia: No Tendon: No Muscle: No Joint: No Bone: No Large (67-100%)] [N/A:N/A] Epithelialization: [4:T Contact Cast otal] [N/A:N/A] Treatment Notes Wound #4 (Right Toe Great) 1. Cleanse With Wound Cleanser Soap and water 2. Periwound Care Moisturizing lotion 3. Primary Dressing Applied Collegen AG Hydrogel or K-Y Jelly 4. Secondary Dressing Foam 7. Footwear/Offloading device applied T Contact Cast otal Notes foam donut as secondary. last layer of TCC applied by MD. Electronic Signature(s) Signed: 04/25/2020 4:47:47 PM By: Linton Ham MD Signed: 04/27/2020 5:24:43 PM By: Deon Pilling Entered By: Linton Ham on 04/25/2020 08:44:21 -------------------------------------------------------------------------------- Multi-Disciplinary Care Plan Details Patient Name: Date of Service: [name], [name] [name] 04/25/2020 8:30 A M Medical  Record Number: [id number] Patient Account Number: [id number] Date of Birth/Sex: Treating RN: 1955-08-06 (64 y.o. Lorette Ang, Meta.Reding Primary Care Pearley Baranek: Howie Ill Other Clinician: Referring Luisenrique Conran: Treating Andelyn Spade/Extender: Aldine Contes in Treatment: 13 Active Inactive Wound/Skin Impairment Nursing Diagnoses: Knowledge deficit related to ulceration/compromised skin integrity Goals: Patient/caregiver will verbalize understanding of skin care regimen Date Initiated: 01/25/2020 Target Resolution Date: 05/18/2020 Goal Status: Active Ulcer/skin breakdown will have a volume reduction of 30% by week 4 Date Initiated: 01/25/2020 Date Inactivated: 04/04/2020 Target Resolution Date: 03/17/2020 Goal Status: Met Ulcer/skin breakdown will have a volume reduction of 50% by week 8 Date Initiated: 04/04/2020 Date Inactivated: 04/11/2020 Target Resolution Date: 04/17/2020 Goal Status: Met Ulcer/skin breakdown will have a volume reduction of 80% by week 12 Date Initiated: 04/11/2020 Target Resolution Date: 05/18/2020 Goal Status: Active Interventions: Assess patient/caregiver ability to obtain necessary supplies Assess patient/caregiver ability to perform ulcer/skin care regimen upon admission and as needed Assess ulceration(s) every visit Notes: Electronic Signature(s) Signed: 04/27/2020 5:24:43 PM By: Deon Pilling Entered By: Deon Pilling on 04/25/2020 08:12:26 -------------------------------------------------------------------------------- Pain Assessment Details Patient Name: Date of Service: [name], [place] 04/25/2020 8:30 A M Medical Record Number: [id number] Patient Account Number: [id number] Date of Birth/Sex: Treating RN: 1955/11/21 (64 y.o. Hessie Diener Primary Care Orpah Hausner: Howie Ill Other Clinician: Referring Athira Janowicz: Treating Crosby Oriordan/Extender: Aldine Contes in Treatment: 13 Active  Problems Location of Pain Severity and Description of Pain Patient Has Paino No Site Locations Rate the pain. Current Pain Level: 0 Pain Management and Medication Current Pain Management: Medication: No Cold Application: No Rest: No Massage: No Activity: No T.E.N.S.: No Heat Application: No Leg drop or elevation: No Is the Current Pain Management Adequate: Adequate How does your wound impact your activities of daily livingo Sleep: No Bathing: No Appetite: No Relationship With Others: No Bladder Continence: No Emotions: No Bowel Continence: No Work: No Toileting: No Drive: No Dressing: No Hobbies: No Electronic Signature(s) Signed: 04/27/2020 5:24:43 PM By: Deon Pilling Entered By: Deon Pilling on 04/25/2020 08:09:45 -------------------------------------------------------------------------------- Patient/Caregiver Education Details Patient Name: Date of Service: Todd Kung, [name] 10/26/2021andnbsp8:30 A M Medical Record Number: [id number] Patient Account Number: [id number] Date of Birth/Gender: Treating RN: 09-Nov-1955 (64 y.o. Hessie Diener Primary Care Physician: Howie Ill Other Clinician: Referring Physician: Treating Physician/Extender: Aldine Contes in Treatment: 13 Education Assessment Education Provided To: Patient Education Topics Provided Wound/Skin Impairment: Handouts: Skin Care Do's and Dont's Methods: Explain/Verbal Responses: Reinforcements needed Electronic Signature(s) Signed: 04/27/2020 5:24:43 PM By: Deon Pilling Entered By: Deon Pilling on 04/25/2020 08:12:38 -------------------------------------------------------------------------------- Wound Assessment Details Patient Name: Date of  Service: [name], [name] [place] 04/25/2020 8:30 A M Medical Record Number: [id number] Patient Account Number: [id number] Date of Birth/Sex: Treating RN: 04/20/1956 (64 y.o. Lorette Ang, Meta.Reding Primary Care Sharis Keeran: Howie Ill Other Clinician: Referring Larina Lieurance: Treating Frances Ambrosino/Extender: Aldine Contes in Treatment: 13 Wound Status Wound Number: 4 Primary Diabetic Wound/Ulcer of the Lower Extremity Etiology: Wound Location: Right T Great oe Wound Status: Open Wounding Event: Blister Comorbid Cataracts, Hypertension, Type II Diabetes, Neuropathy, Date Acquired: 12/24/2019 History: Confinement Anxiety Weeks Of Treatment: 13 Clustered Wound: No Photos Photo Uploaded By: Mikeal Hawthorne on 04/26/2020 09:49:41 Wound Measurements Length: (cm) 0.2 Width: (cm) 0.2 Depth: (cm) 0.1 Area: (cm) 0.031 Volume: (cm) 0.003 % Reduction in Area: 78% % Reduction in Volume: 95.8% Epithelialization: Large (67-100%) Tunneling: No Undermining: No Wound Description Classification: Grade 2 Wound Margin: Well defined, not attached Exudate Amount: Small Exudate Type: Serosanguineous Exudate Color: red, brown Foul Odor After Cleansing: No Slough/Fibrino No Wound Bed Granulation Amount: Large (67-100%) Exposed Structure Granulation Quality: Red Fascia Exposed: No Necrotic Amount: None Present (0%) Fat Layer (Subcutaneous Tissue) Exposed: Yes Tendon Exposed: No Muscle Exposed: No Joint Exposed: No Bone Exposed: No Treatment Notes Wound #4 (Right Toe Great) 1. Cleanse With Wound Cleanser Soap and water 2. Periwound Care Moisturizing lotion 3. Primary Dressing Applied Collegen AG Hydrogel or K-Y Jelly 4. Secondary Dressing Foam 7. Footwear/Offloading device applied T Contact Cast otal Notes foam donut as secondary. last layer of TCC applied by MD. Electronic Signature(s) Signed: 04/27/2020 5:24:43 PM By: Deon Pilling Entered By: Deon Pilling on 04/25/2020 08:11:35 -------------------------------------------------------------------------------- Vitals Details Patient Name: Date of Service: [name], [place] 04/25/2020 8:30 A M Medical Record Number:  [id number] Patient Account Number: [id number] Date of Birth/Sex: Treating RN: 1956-06-28 (64 y.o. Lorette Ang, Meta.Reding Primary Care Elisea Khader: Other Clinician: Howie Ill Referring Ginnette Gates: Treating Jaydn Fincher/Extender: Aldine Contes in Treatment: 13 Vital Signs Time Taken: 08:04 Temperature (F): 97.8 Height (in): 70 Pulse (bpm): 83 Weight (lbs): 222 Respiratory Rate (breaths/min): 16 Body Mass Index (BMI): 31.9 Blood Pressure (mmHg): 171/85 Capillary Blood Glucose (mg/dl): 162 Reference Range: 80 - 120 mg / dl Electronic Signature(s) Signed: 04/27/2020 5:24:43 PM By: Deon Pilling Entered By: Deon Pilling on 04/25/2020 08:09:39

## 2020-04-27 NOTE — Progress Notes (Signed)
ICARUS, PARTCH ([id number]) Visit Report for 04/25/2020 HPI Details Patient Name: Date of Service: [place], [name] 04/25/2020 8:30 A M Medical Record Number: [id number] Patient Account Number: [id number] Date of Birth/Sex: Treating RN: Mar 30, 1956 (64 y.o. [name] Primary Care Provider: Cynda Familia Other Clinician: Referring Provider: Treating Provider/Extender: Willia Craze in Treatment: 13 History of Present Illness HPI Description: 12/16/18 on evaluation today patient presents for initial evaluation our clinic as a self-referral due to a wound that is had on his right first toe for 6-8 weeks. He states his last hemoglobin A1c which was roughly 4 months ago was 11.0 he knows he has not been taking care of himself in this regard. He's actually been out of medications for two weeks both in regard to his diabetes as well as his high blood pressure. He states he is going to see those doctors in the next week. Subsequently he actually told me that being here in the clinic and seeing so many patients that did have applications in such has really got him thinking about the fact that he needs to take better care of himself in this regard. Fortunately there's no signs of active infection at this time which is good news. No fevers, chills, nausea, or vomiting noted at this time. He has been seen a local podiatrist for the past 6-8 weeks he was recommending for the patient that he soak in Epsom salts and come in on the patient tells me generally Tuesday and Thursday for debridement's and otherwise was told to leave the wound open to air just put a sock on and go about his business and that "it would scab up and be okay". With that being said the patient states when he realized things were not getting better and he really did not agree with the treatment plan he decided to get a second opinion here the wound care center. Apparently he's had x-rays although the  patient's podiatrist has not sent [place] records at this point I think we may want to send them to the hospital for an x-ray just to have everything documented before we see about what we need to do from the standpoint of more aggressive treatment such as potentially casting all over this be his right foot that's gonna be somewhat difficult in my pinion. 12/23/18 on evaluation today patient actually appears to be doing okay in regard to his plantar foot ulcer. Unfortunately he was not able to go get the x-ray during the last visit here in the office. Subsequently he states that he actually forgot to go do that in the interim between last week and this week. That is something is gonna go do as soon as he leaves here today. We discussed again that I do think the total contact cast would be the appropriate way to go in the best way to get the news to heal. With that being said beginning the x-ray clear before we proceed with the cast he understands. 6/26; patient here for obligatory total contact cast change no complaints 12/30/18 on evaluation today patient actually appears to be doing excellent in regard to his plantar foot wound. This is looking tremendously better there's new callous buildup it's not as deep and overall show signs of excellent improvement. I'm very pleased in this regard. Overall I think that he should continue with the Current wound care measures including the total contact cast as I believe this is gonna be most helpful in getting this to  heal very quickly. 01/06/19 on evaluation today patient actually appears to be doing quite well with regard to his toe ulcer. This is shown signs of good improvement in fact is measuring smaller by about half of what it was last week. Nonetheless I think this is excellent improvement is filled in an overall is making great progress. No fevers, chills, nausea, or vomiting noted at this time. 7/16; patient is here for review of a wound on the plantar aspect  of his right great toe. Been using silver collagen under a total contact cast 01/20/19 on evaluation today patient actually presents for a slightly early follow-up due to the fact that his cast has been rubbing on the right medial ankle region. Unfortunately he has a new wound at this location because of the rubbing of the cast. There does not appear to be signs of active infection which is good news. No fevers, chills, nausea, or vomiting noted at this time. Overall been pleased with how things have progressed in regard to his toe again today this appears to be doing well. 02/01/19-Patient returns at 12 days being out of the cast on account of going to the beach, he has been using Prisma to his wounds on his right medial ankle and right great toe plantar aspect. Both appear to be around the same dimensions. 02/08/19-Patient returns at 1 week after being in a cast both the wounds are better in fact the right great plantar wound is healed, right medial ankle wound looks better. We are using Prisma. Does not need to be in cast anymore 8/24; 2-week follow-up. Everything is healed here including the right great plantar toe and the cast injury on the medial ankle. He still has callus on the plantar right great toe. He has gone to [employer] I believe and has custom inserts at least. His wounds have closed over READMISSION 01/25/2020. This is a 64 year old man with poorly controlled type 2 diabetes with a recent hemoglobin A1c of 9.3. He also has diabetic neuropathy. We had him here extensively in 2020 again for a wound on the plantar aspect of the right great toe. Eventually we heal this over and a total contact cast. He got custom inserts for his running shoes at [employer]. He has been using them reliably. He states that roughly 3 to 4 weeks ago he developed a thick callus on the bottom of the toe it eventually separated or split and underneath was an open wound. He saw his primary doctor who would and put him on  doxycycline for 10 days. Past medical history type 2 diabetes with peripheral neuropathy, hyperlipidemia and hypertension. ABI in our clinic was 1.17 on the right 02/11/20-Patient back at 2 weeks, right great toe plantar wound is about the same with slight callus build up. 8/27; 2-week follow-up right. Aggressively debrided last time considerable callus buildup. We have been using silver collagen. This is definitely going to require a total contact cast however a week today his daughter gets married we elected to delay that until the Monday after that I E 10 days from now. 9/7; right plantar toe. Aggressively debrided last time and the wound actually looks some better with less depth. 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 In view of this I did not put a total contact cast on. Also noted he is having marked indentation of his right great toe in the surgical shoe he is wearing  9/14; right plantar great toe. Surface of the wound looks healthy again he has overriding skin. He is leaving for [place] on vacation on 9/24, after this I think he will require a total contact cast. We started him off on silver collagen for some reason he changed to silver alginate I am going to change him back to silver collagen today 10/5; right plantar great toe. He is returned from [place] he was not offloading this at all I am not exactly sure whether he was even dressing this. Arrives with extensive undermining from the wound on the plantar aspect of the right great toe. He has a surgical shoe but nothing else no felt etc. 10/12; right plantar great toe. The wound does not look unhealthy however its has thick edges overhanging skin. He is ready for a total contact cast. Still using silver collagen as the primary dressing 10/15; the patient is here for the obligatory first total contact cast change. This was done in the standard fashion the wound was not  examined 10/19 wound bed looks smaller but still some depth. Healthy granulation. Using silver collagen under a total contact cast 10/26; small wound minimal depth no undermining or using silver collagen under a total contact cast Electronic Signature(s) Signed: 04/25/2020 4:47:47 PM By: Baltazar Najjar MD Entered By: Baltazar Najjar on 04/25/2020 08:46:08 -------------------------------------------------------------------------------- Physical Exam Details Patient Name: Date of Service: [name], [name] 04/25/2020 8:30 A M Medical Record Number: [id number] Patient Account Number: [id number] Date of Birth/Sex: Treating RN: 1956-02-18 (64 y.o. [name] Primary Care Provider: Cynda Familia Other Clinician: Referring Provider: Treating Provider/Extender: Willia Craze in Treatment: 13 Constitutional Patient is hypertensive.. Pulse regular and within target range for patient.Marland Kitchen Respirations regular, non-labored and within target range.. Temperature is normal and within the target range for the patient.Marland Kitchen Appears in no distress. Notes Wound exam; right plantar great toe. Surface of the wound continues to look healthy. No debridement is required. There was no measurable undermining. Epithelialization looks healthy. My only concern is somewhat senescent looking wound edges around the small wound Electronic Signature(s) Signed: 04/25/2020 4:47:47 PM By: Baltazar Najjar MD Entered By: Baltazar Najjar on 04/25/2020 08:47:55 -------------------------------------------------------------------------------- Physician Orders Details Patient Name: Date of Service: [name], [name] 04/25/2020 8:30 A M Medical Record Number: [id number] Patient Account Number: [id number] Date of Birth/Sex: Treating RN: 1956-06-20 (65 y.o. [name] Primary Care Provider: Cynda Familia Other Clinician: Referring Provider: Treating Provider/Extender: Willia Craze in Treatment: 13 Verbal / Phone Orders: No Diagnosis Coding ICD-10 Coding Code Description E11.621 Type 2 diabetes mellitus with foot ulcer E11.42 Type 2 diabetes mellitus with diabetic polyneuropathy L97.512 Non-pressure chronic ulcer of other part of right foot with fat layer exposed Follow-up Appointments ppointment in 1 week. - tuesday Return A Dressing Change Frequency Do not change entire dressing for one week. Wound Cleansing May shower with protection. Primary Wound Dressing Wound #4 Right T Great oe Silver Collagen - moisten with normal saline or [place] jelly Secondary Dressing Wound #4 Right T Great oe Foam - foam donut Dry Gauze Off-Loading Wound #4 Right T Great oe Total Contact Cast to Right Lower Extremity Electronic Signature(s) Signed: 04/25/2020 4:47:47 PM By: Baltazar Najjar MD Signed: 04/27/2020 5:24:43 PM By: Shawn Stall Entered By: Shawn Stall on 04/25/2020 08:13:08 -------------------------------------------------------------------------------- Problem List Details Patient Name: Date of Service: [name], [name] 04/25/2020 8:30 A M Medical Record Number: [id number] Patient Account Number: [id number] Date of Birth/Sex: Treating  RN: [birth year] (64 y.o. [name] Primary Care Provider: Cynda Familia Other Clinician: Referring Provider: Treating Provider/Extender: Willia Craze in Treatment: 13 Active Problems ICD-10 Encounter Code Description Active Date MDM Diagnosis E11.621 Type 2 diabetes mellitus with foot ulcer 01/25/2020 No Yes E11.42 Type 2 diabetes mellitus with diabetic polyneuropathy 01/25/2020 No Yes L97.512 Non-pressure chronic ulcer of other part of right foot with fat layer exposed 01/25/2020 No Yes Inactive Problems Resolved Problems Electronic Signature(s) Signed: 04/25/2020 4:47:47 PM By: Baltazar Najjar MD Entered By: Baltazar Najjar on 04/25/2020  08:44:03 -------------------------------------------------------------------------------- Progress Note Details Patient Name: Date of Service: [name], [name] 04/25/2020 8:30 A M Medical Record Number: [id number] Patient Account Number: [id number] Date of Birth/Sex: Treating RN: September 07, 1955 (64 y.o. [name] Primary Care Provider: Other Clinician: Cynda Familia Referring Provider: Treating Provider/Extender: Willia Craze in Treatment: 13 Subjective History of Present Illness (HPI) 12/16/18 on evaluation today patient presents for initial evaluation our clinic as a self-referral due to a wound that is had on his right first toe for 6-8 weeks. He states his last hemoglobin A1c which was roughly 4 months ago was 11.0 he knows he has not been taking care of himself in this regard. He's actually been out of medications for two weeks both in regard to his diabetes as well as his high blood pressure. He states he is going to see those doctors in the next week. Subsequently he actually told me that being here in the clinic and seeing so many patients that did have applications in such has really got him thinking about the fact that he needs to take better care of himself in this regard. Fortunately there's no signs of active infection at this time which is good news. No fevers, chills, nausea, or vomiting noted at this time. He has been seen a local podiatrist for the past 6-8 weeks he was recommending for the patient that he soak in Epsom salts and come in on the patient tells me generally Tuesday and Thursday for debridement's and otherwise was told to leave the wound open to air just put a sock on and go about his business and that "it would scab up and be okay". With that being said the patient states when he realized things were not getting better and he really did not agree with the treatment plan he decided to get a second opinion here the wound care center.  Apparently he's had x-rays although the patient's podiatrist has not sent [place] records at this point I think we may want to send them to the hospital for an x-ray just to have everything documented before we see about what we need to do from the standpoint of more aggressive treatment such as potentially casting all over this be his right foot that's gonna be somewhat difficult in my pinion. 12/23/18 on evaluation today patient actually appears to be doing okay in regard to his plantar foot ulcer. Unfortunately he was not able to go get the x-ray during the last visit here in the office. Subsequently he states that he actually forgot to go do that in the interim between last week and this week. That is something is gonna go do as soon as he leaves here today. We discussed again that I do think the total contact cast would be the appropriate way to go in the best way to get the news to heal. With that being said beginning the x-ray clear  before we proceed with the cast he understands. 6/26; patient here for obligatory total contact cast change no complaints 12/30/18 on evaluation today patient actually appears to be doing excellent in regard to his plantar foot wound. This is looking tremendously better there's new callous buildup it's not as deep and overall show signs of excellent improvement. I'm very pleased in this regard. Overall I think that he should continue with the Current wound care measures including the total contact cast as I believe this is gonna be most helpful in getting this to heal very quickly. 01/06/19 on evaluation today patient actually appears to be doing quite well with regard to his toe ulcer. This is shown signs of good improvement in fact is measuring smaller by about half of what it was last week. Nonetheless I think this is excellent improvement is filled in an overall is making great progress. No fevers, chills, nausea, or vomiting noted at this time. 7/16; patient is here for  review of a wound on the plantar aspect of his right great toe. Been using silver collagen under a total contact cast 01/20/19 on evaluation today patient actually presents for a slightly early follow-up due to the fact that his cast has been rubbing on the right medial ankle region. Unfortunately he has a new wound at this location because of the rubbing of the cast. There does not appear to be signs of active infection which is good news. No fevers, chills, nausea, or vomiting noted at this time. Overall been pleased with how things have progressed in regard to his toe again today this appears to be doing well. 02/01/19-Patient returns at 12 days being out of the cast on account of going to the beach, he has been using Prisma to his wounds on his right medial ankle and right great toe plantar aspect. Both appear to be around the same dimensions. 02/08/19-Patient returns at 1 week after being in a cast both the wounds are better in fact the right great plantar wound is healed, right medial ankle wound looks better. We are using Prisma. Does not need to be in cast anymore 8/24; 2-week follow-up. Everything is healed here including the right great plantar toe and the cast injury on the medial ankle. He still has callus on the plantar right great toe. He has gone to [employer] I believe and has custom inserts at least. His wounds have closed over READMISSION 01/25/2020. This is a 64 year old man with poorly controlled type 2 diabetes with a recent hemoglobin A1c of 9.3. He also has diabetic neuropathy. We had him here extensively in 2020 again for a wound on the plantar aspect of the right great toe. Eventually we heal this over and a total contact cast. He got custom inserts for his running shoes at [employer]. He has been using them reliably. He states that roughly 3 to 4 weeks ago he developed a thick callus on the bottom of the toe it eventually separated or split and underneath was an open wound. He saw his  primary doctor who would and put him on doxycycline for 10 days. Past medical history type 2 diabetes with peripheral neuropathy, hyperlipidemia and hypertension. ABI in our clinic was 1.17 on the right 02/11/20-Patient back at 2 weeks, right great toe plantar wound is about the same with slight callus build up. 8/27; 2-week follow-up right. Aggressively debrided last time considerable callus buildup. We have been using silver collagen. This is definitely going to require a total contact cast however  a week today his daughter gets married we elected to delay that until the Monday after that I E 10 days from now. 9/7; right plantar toe. Aggressively debrided last time and the wound actually looks some better with less depth. 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 In view of this I did not put a total contact cast on. Also noted he is having marked indentation of his right great toe in the surgical shoe he is wearing 9/14; right plantar great toe. Surface of the wound looks healthy again he has overriding skin. He is leaving for [place] on vacation on 9/24, after this I think he will require a total contact cast. We started him off on silver collagen for some reason he changed to silver alginate I am going to change him back to silver collagen today 10/5; right plantar great toe. He is returned from [place] he was not offloading this at all I am not exactly sure whether he was even dressing this. Arrives with extensive undermining from the wound on the plantar aspect of the right great toe. He has a surgical shoe but nothing else no felt etc. 10/12; right plantar great toe. The wound does not look unhealthy however its has thick edges overhanging skin. He is ready for a total contact cast. Still using silver collagen as the primary dressing 10/15; the patient is here for the obligatory first total contact cast change. This was done  in the standard fashion the wound was not examined 10/19 wound bed looks smaller but still some depth. Healthy granulation. Using silver collagen under a total contact cast 10/26; small wound minimal depth no undermining or using silver collagen under a total contact cast Objective Constitutional Patient is hypertensive.. Pulse regular and within target range for patient.Marland Kitchen Respirations regular, non-labored and within target range.. Temperature is normal and within the target range for the patient.Marland Kitchen Appears in no distress. Vitals Time Taken: 8:04 AM, Height: 70 in, Weight: 222 lbs, BMI: 31.9, Temperature: 97.8 F, Pulse: 83 bpm, Respiratory Rate: 16 breaths/min, Blood Pressure: 171/85 mmHg, Capillary Blood Glucose: 162 mg/dl. General Notes: Wound exam; right plantar great toe. Surface of the wound continues to look healthy. No debridement is required. There was no measurable undermining. Epithelialization looks healthy. My only concern is somewhat senescent looking wound edges around the small wound Integumentary (Hair, Skin) Wound #4 status is Open. Original cause of wound was Blister. The wound is located on the Right T Great. The wound measures 0.2cm length x 0.2cm width oe x 0.1cm depth; 0.031cm^2 area and 0.003cm^3 volume. There is Fat Layer (Subcutaneous Tissue) exposed. There is no tunneling or undermining noted. There is a small amount of serosanguineous drainage noted. The wound margin is well defined and not attached to the wound base. There is large (67-100%) red granulation within the wound bed. There is no necrotic tissue within the wound bed. Assessment Active Problems ICD-10 Type 2 diabetes mellitus with foot ulcer Type 2 diabetes mellitus with diabetic polyneuropathy Non-pressure chronic ulcer of other part of right foot with fat layer exposed Procedures Wound #4 Pre-procedure diagnosis of Wound #4 is a Diabetic Wound/Ulcer of the Lower Extremity located on the Right T Great  . There was a T Contact Cast oe otal Procedure by Maxwell Caul., MD. Post procedure Diagnosis Wound #4: Same as Pre-Procedure Plan Follow-up Appointments: Return Appointment in 1 week. - tuesday Dressing Change Frequency: Do  not change entire dressing for one week. Wound Cleansing: May shower with protection. Primary Wound Dressing: Wound #4 Right T Great: oe Silver Collagen - moisten with normal saline or [place] jelly Secondary Dressing: Wound #4 Right T Great: oe Foam - foam donut Dry Gauze Off-Loading: Wound #4 Right T Great: oe T Contact Cast to Right Lower Extremity otal 1. I continued with the silver collagen-based dressings under the total contact cast 2. I am concerned about the senescent edges around this wound however I elected not to debride this today seeing if this will close over 3. T contact cast reapplied in standard fashion otal Electronic Signature(s) Signed: 04/25/2020 4:47:47 PM By: Baltazar Najjarobson, Briana Farner MD Entered By: Baltazar Najjarobson, Dailey Alberson on 04/25/2020 08:49:50 -------------------------------------------------------------------------------- Total Contact Cast Details Patient Name: Date of [place] 04/25/2020 8:30 A M Medical Record Number: [id number] Patient Account Number: [id number] Date of Birth/Sex: Treating RN: [birth year] (64 y.o. Todd SoursM) Deaton, Bobbi Primary Care Provider: Cynda FamiliaMeyers, Stephen C Other Clinician: Referring Provider: Treating Provider/Extender: Willia Crazeobson, Aleea Hendry Meyers, Stephen C Weeks in Treatment: 13 T Contact Cast Applied for Wound Assessment: otal Wound #4 Right T Great oe Performed By: Physician Maxwell Caulobson, Madeline Pho G., MD Post Procedure Diagnosis Same as Pre-procedure Electronic Signature(s) Signed: 04/25/2020 4:47:47 PM By: Baltazar Najjarobson, Esco Joslyn MD Entered By: Baltazar Najjarobson, Krysia Zahradnik on 04/25/2020 08:44:29 -------------------------------------------------------------------------------- SuperBill Details Patient Name: Date of  Service: [place] 04/25/2020 Medical Record Number: [id number] Patient Account Number: [id number] Date of Birth/Sex: Treating RN: [birth year] (64 y.o. Todd SoursM) Deaton, Bobbi Primary Care Provider: Cynda FamiliaMeyers, Stephen C Other Clinician: Referring Provider: Treating Provider/Extender: Willia Crazeobson, Tomasz Steeves Meyers, Stephen C Weeks in Treatment: 13 Diagnosis Coding ICD-10 Codes Code Description E11.621 Type 2 diabetes mellitus with foot ulcer E11.42 Type 2 diabetes mellitus with diabetic polyneuropathy L97.512 Non-pressure chronic ulcer of other part of right foot with fat layer exposed Facility Procedures CPT4 Code: [id number] Description: [id number] - APPLY TOTAL CONTACT LEG CAST ICD-10 Diagnosis Description L97.512 Non-pressure chronic ulcer of other part of right foot with fat layer exposed Modifier: Quantity: 1 Physician Procedures : CPT4 Code Description Modifier [id number] - WC PHYS APPLY TOTAL CONTACT CAST ICD-10 Diagnosis Description L97.512 Non-pressure chronic ulcer of other part of right foot with fat layer exposed Quantity: 1 Electronic Signature(s) Signed: 04/25/2020 4:47:47 PM By: Baltazar Najjarobson, Mariapaula Krist MD Entered By: Baltazar Najjarobson, Brandn Mcgath on 04/25/2020 08:51:22

## 2020-04-27 NOTE — Progress Notes (Deleted)
HPI: Follow-up CAD.  Cardiac CTA July 2021 showed calcium score 43.6 and 25 to 49% stenosis in the first diagonal and inferior branch of the second diagonal.  Since last seen  Current Outpatient Medications  Medication Sig Dispense Refill  . aspirin EC 81 MG tablet Take 1 tablet (81 mg total) by mouth daily. Swallow whole. 90 tablet 3  . lisinopril (ZESTRIL) 20 MG tablet Take 20 mg by mouth daily.    . metoprolol tartrate (LOPRESSOR) 100 MG tablet TAKE 2 HOURS PRIOR TO CT SCAN 1 tablet 0  . rosuvastatin (CRESTOR) 40 MG tablet Take 1 tablet (40 mg total) by mouth daily. 90 tablet 3  . Semaglutide,0.25 or 0.5MG /DOS, (OZEMPIC, 0.25 OR 0.5 MG/DOSE,) 2 MG/1.5ML SOPN Inject 0.25 mg into the skin once a week.    . sitaGLIPtin-metformin (JANUMET) 50-1000 MG tablet Take 1 tablet by mouth 2 (two) times daily.     No current facility-administered medications for this visit.     Past Medical History:  Diagnosis Date  . Diabetes mellitus without complication (HCC)   . Hypertension     Past Surgical History:  Procedure Laterality Date  . Arthroscopic knee surgery      Social History   Socioeconomic History  . Marital status: Married    Spouse name: Not on file  . Number of children: 4  . Years of education: Not on file  . Highest education level: Not on file  Occupational History  . Not on file  Tobacco Use  . Smoking status: Never Smoker  . Smokeless tobacco: Never Used  . Tobacco comment: expose to passive smoke  Vaping Use  . Vaping Use: Never used  Substance and Sexual Activity  . Alcohol use: Yes    Comment: occasionally  . Drug use: Never  . Sexual activity: Yes  Other Topics Concern  . Not on file  Social History Narrative  . Not on file   Social Determinants of Health   Financial Resource Strain:   . Difficulty of Paying Living Expenses: Not on file  Food Insecurity:   . Worried About Programme researcher, broadcasting/film/video in the Last Year: Not on file  . Ran Out of Food in  the Last Year: Not on file  Transportation Needs:   . Lack of Transportation (Medical): Not on file  . Lack of Transportation (Non-Medical): Not on file  Physical Activity:   . Days of Exercise per Week: Not on file  . Minutes of Exercise per Session: Not on file  Stress:   . Feeling of Stress : Not on file  Social Connections:   . Frequency of Communication with Friends and Family: Not on file  . Frequency of Social Gatherings with Friends and Family: Not on file  . Attends Religious Services: Not on file  . Active Member of Clubs or Organizations: Not on file  . Attends Banker Meetings: Not on file  . Marital Status: Not on file  Intimate Partner Violence:   . Fear of Current or Ex-Partner: Not on file  . Emotionally Abused: Not on file  . Physically Abused: Not on file  . Sexually Abused: Not on file    Family History  Problem Relation Age of Onset  . CAD Father   . CAD Brother     ROS: no fevers or chills, productive cough, hemoptysis, dysphasia, odynophagia, melena, hematochezia, dysuria, hematuria, rash, seizure activity, orthopnea, PND, pedal edema, claudication. Remaining systems are negative.  Physical Exam: Well-developed well-nourished in no acute distress.  Skin is warm and dry.  HEENT is normal.  Neck is supple.  Chest is clear to auscultation with normal expansion.  Cardiovascular exam is regular rate and rhythm.  Abdominal exam nontender or distended. No masses palpated. Extremities show no edema. neuro grossly intact  ECG- personally reviewed  A/P  1 coronary artery disease-patient has had previous atypical chest pain but no exertional symptoms.  Recent CTA showed mild nonobstructive coronary disease.  Plan to continue aspirin and statin.  2 hypertension-patient's blood pressure is controlled.  Continue present medications and follow.  3 hyperlipidemia-continue statin.  Check lipids and liver.  Olga Millers, MD

## 2020-05-01 ENCOUNTER — Encounter: Payer: Self-pay | Admitting: *Deleted

## 2020-05-02 ENCOUNTER — Encounter (HOSPITAL_BASED_OUTPATIENT_CLINIC_OR_DEPARTMENT_OTHER): Payer: 59 | Attending: Internal Medicine | Admitting: Internal Medicine

## 2020-05-02 ENCOUNTER — Other Ambulatory Visit: Payer: Self-pay

## 2020-05-02 DIAGNOSIS — E1142 Type 2 diabetes mellitus with diabetic polyneuropathy: Secondary | ICD-10-CM | POA: Diagnosis not present

## 2020-05-02 DIAGNOSIS — L84 Corns and callosities: Secondary | ICD-10-CM | POA: Insufficient documentation

## 2020-05-02 DIAGNOSIS — E11621 Type 2 diabetes mellitus with foot ulcer: Secondary | ICD-10-CM | POA: Insufficient documentation

## 2020-05-02 DIAGNOSIS — L97512 Non-pressure chronic ulcer of other part of right foot with fat layer exposed: Secondary | ICD-10-CM | POA: Diagnosis not present

## 2020-05-03 ENCOUNTER — Ambulatory Visit: Payer: 59 | Admitting: Cardiology

## 2020-05-03 NOTE — Progress Notes (Signed)
BISHOY, CUPP ([id number]) Visit Report for 05/02/2020 Debridement Details Patient Name: Date of Service: [place], [name] 05/02/2020 8:30 A M Medical Record Number: [id number] Patient Account Number: [id number] Date of Birth/Sex: Treating RN: July 28, 1955 (64 y.o. Judie Petit) [name] Primary Care Provider: Cynda Familia Other Clinician: Referring Provider: Treating Provider/Extender: Willia Craze in Treatment: 14 Debridement Performed for Assessment: Wound #4 Right T Great oe Performed By: Physician Maxwell Caul., MD Debridement Type: Debridement Severity of Tissue Pre Debridement: Fat layer exposed Level of Consciousness (Pre-procedure): Awake and Alert Pre-procedure Verification/Time Out Yes - 09:14 Taken: Start Time: 09:14 T Area Debrided (L x W): otal 0.2 (cm) x 0.2 (cm) = 0.04 (cm) Tissue and other material debrided: Viable, Non-Viable, Callus, Slough, Subcutaneous, Slough Level: Skin/Subcutaneous Tissue Debridement Description: Excisional Instrument: Curette Bleeding: Moderate Hemostasis Achieved: Silver Nitrate End Time: 09:16 Procedural Pain: 0 Post Procedural Pain: 0 Response to Treatment: Procedure was tolerated well Level of Consciousness (Post- Awake and Alert procedure): Post Debridement Measurements of Total Wound Length: (cm) 0.2 Width: (cm) 0.2 Depth: (cm) 0.1 Volume: (cm) 0.003 Character of Wound/Ulcer Post Debridement: Improved Severity of Tissue Post Debridement: Fat layer exposed Post Procedure Diagnosis Same as Pre-procedure Electronic Signature(s) Signed: 05/02/2020 4:57:00 PM By: Baltazar Najjar MD Signed: 05/03/2020 9:08:43 AM By: [name] RN Entered By: Baltazar Najjar on 05/02/2020 10:02:23 -------------------------------------------------------------------------------- HPI Details Patient Name: Date of Service: [name], [name] 05/02/2020 8:30 A M Medical Record Number: [id number] Patient Account Number:  [id number] Date of Birth/Sex: Treating RN: Sep 16, 1955 (64 y.o. Melonie [place] Primary Care Provider: Cynda Familia Other Clinician: Referring Provider: Treating Provider/Extender: Willia Craze in Treatment: 14 History of Present Illness HPI Description: 12/16/18 on evaluation today patient presents for initial evaluation our clinic as a self-referral due to a wound that is had on his right first toe for 6-8 weeks. He states his last hemoglobin A1c which was roughly 4 months ago was 11.0 he knows he has not been taking care of himself in this regard. He's actually been out of medications for two weeks both in regard to his diabetes as well as his high blood pressure. He states he is going to see those doctors in the next week. Subsequently he actually told me that being here in the clinic and seeing so many patients that did have applications in such has really got him thinking about the fact that he needs to take better care of himself in this regard. Fortunately there's no signs of active infection at this time which is good news. No fevers, chills, nausea, or vomiting noted at this time. He has been seen a local podiatrist for the past 6-8 weeks he was recommending for the patient that he soak in Epsom salts and come in on the patient tells me generally Tuesday and Thursday for debridement's and otherwise was told to leave the wound open to air just put a sock on and go about his business and that "it would scab up and be okay". With that being said the patient states when he realized things were not getting better and he really did not agree with the treatment plan he decided to get a second opinion here the wound care center. Apparently he's had x-rays although the patient's podiatrist has not sent [place] records at this point I think we may want to send them to the hospital for an x-ray just to have everything documented before we see about what we need  to do from the  standpoint of more aggressive treatment such as potentially casting all over this be his right foot that's gonna be somewhat difficult in my pinion. 12/23/18 on evaluation today patient actually appears to be doing okay in regard to his plantar foot ulcer. Unfortunately he was not able to go get the x-ray during the last visit here in the office. Subsequently he states that he actually forgot to go do that in the interim between last week and this week. That is something is gonna go do as soon as he leaves here today. We discussed again that I do think the total contact cast would be the appropriate way to go in the best way to get the news to heal. With that being said beginning the x-ray clear before we proceed with the cast he understands. 6/26; patient here for obligatory total contact cast change no complaints 12/30/18 on evaluation today patient actually appears to be doing excellent in regard to his plantar foot wound. This is looking tremendously better there's new callous buildup it's not as deep and overall show signs of excellent improvement. I'm very pleased in this regard. Overall I think that he should continue with the Current wound care measures including the total contact cast as I believe this is gonna be most helpful in getting this to heal very quickly. 01/06/19 on evaluation today patient actually appears to be doing quite well with regard to his toe ulcer. This is shown signs of good improvement in fact is measuring smaller by about half of what it was last week. Nonetheless I think this is excellent improvement is filled in an overall is making great progress. No fevers, chills, nausea, or vomiting noted at this time. 7/16; patient is here for review of a wound on the plantar aspect of his right great toe. Been using silver collagen under a total contact cast 01/20/19 on evaluation today patient actually presents for a slightly early follow-up due to the fact that his cast has been  rubbing on the right medial ankle region. Unfortunately he has a new wound at this location because of the rubbing of the cast. There does not appear to be signs of active infection which is good news. No fevers, chills, nausea, or vomiting noted at this time. Overall been pleased with how things have progressed in regard to his toe again today this appears to be doing well. 02/01/19-Patient returns at 12 days being out of the cast on account of going to the beach, he has been using Prisma to his wounds on his right medial ankle and right great toe plantar aspect. Both appear to be around the same dimensions. 02/08/19-Patient returns at 1 week after being in a cast both the wounds are better in fact the right great plantar wound is healed, right medial ankle wound looks better. We are using Prisma. Does not need to be in cast anymore 8/24; 2-week follow-up. Everything is healed here including the right great plantar toe and the cast injury on the medial ankle. He still has callus on the plantar right great toe. He has gone to [employer] I believe and has custom inserts at least. His wounds have closed over READMISSION 01/25/2020. This is a 65 year old man with poorly controlled type 2 diabetes with a recent hemoglobin A1c of 9.3. He also has diabetic neuropathy. We had him here extensively in 2020 again for a wound on the plantar aspect of the right great toe. Eventually we heal this over and  a total contact cast. He got custom inserts for his running shoes at [employer]. He has been using them reliably. He states that roughly 3 to 4 weeks ago he developed a thick callus on the bottom of the toe it eventually separated or split and underneath was an open wound. He saw his primary doctor who would and put him on doxycycline for 10 days. Past medical history type 2 diabetes with peripheral neuropathy, hyperlipidemia and hypertension. ABI in our clinic was 1.17 on the right 02/11/20-Patient back at 2 weeks,  right great toe plantar wound is about the same with slight callus build up. 8/27; 2-week follow-up right. Aggressively debrided last time considerable callus buildup. We have been using silver collagen. This is definitely going to require a total contact cast however a week today his daughter gets married we elected to delay that until the Monday after that I E 10 days from now. 9/7; right plantar toe. Aggressively debrided last time and the wound actually looks some better with less depth. 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 In view of this I did not put a total contact cast on. Also noted he is having marked indentation of his right great toe in the surgical shoe he is wearing 9/14; right plantar great toe. Surface of the wound looks healthy again he has overriding skin. He is leaving for [place] on vacation on 9/24, after this I think he will require a total contact cast. We started him off on silver collagen for some reason he changed to silver alginate I am going to change him back to silver collagen today 10/5; right plantar great toe. He is returned from [place] he was not offloading this at all I am not exactly sure whether he was even dressing this. Arrives with extensive undermining from the wound on the plantar aspect of the right great toe. He has a surgical shoe but nothing else no felt etc. 10/12; right plantar great toe. The wound does not look unhealthy however its has thick edges overhanging skin. He is ready for a total contact cast. Still using silver collagen as the primary dressing 10/15; the patient is here for the obligatory first total contact cast change. This was done in the standard fashion the wound was not examined 10/19 wound bed looks smaller but still some depth. Healthy granulation. Using silver collagen under a total contact cast 10/26; small wound minimal depth no undermining or using silver  collagen under a total contact cast 11/2; after debridement today of callus subcutaneous tissue from the wound margin there is only a very tiny open area remaining. Continue silver collagen under total contact cast should be closed by next week Electronic Signature(s) Signed: 05/02/2020 4:57:00 PM By: Baltazar Najjar MD Entered By: Baltazar Najjar on 05/02/2020 10:02:57 -------------------------------------------------------------------------------- Physical Exam Details Patient Name: Date of Service: [name], [name] 05/02/2020 8:30 A M Medical Record Number: [id number] Patient Account Number: [id number] Date of Birth/Sex: Treating RN: Jan 05, 1956 (64 y.o. Melonie [place] Primary Care Provider: Cynda Familia Other Clinician: Referring Provider: Treating Provider/Extender: Willia Craze in Treatment: 14 Constitutional Patient is hypertensive.. Pulse regular and within target range for patient.Marland Kitchen Respirations regular, non-labored and within target range.. Temperature is normal and within the target range for the patient.Marland Kitchen Appears in no distress. Notes Wound exam right plantar great toe. I removed thick callus subcutaneous tissue from around the wound margins.  After debridement only a very superficial area with no depth remains. Tissue looks healthy. A bit surprising that there is so much nonviable surface that this woman the fact these been in a total contact cast although I am aware sometimes this takes some time for reverse Electronic Signature(s) Signed: 05/02/2020 4:57:00 PM By: Baltazar Najjar MD Entered By: Baltazar Najjar on 05/02/2020 10:04:23 -------------------------------------------------------------------------------- Physician Orders Details Patient Name: Date of Service: [name], [name] 05/02/2020 8:30 A M Medical Record Number: [id number] Patient Account Number: [id number] Date of Birth/Sex: Treating RN: Jul 08, 1955 (64 y.o. Melonie [place] Primary  Care Provider: Cynda Familia Other Clinician: Referring Provider: Treating Provider/Extender: Willia Craze in Treatment: 14 Verbal / Phone Orders: No Diagnosis Coding ICD-10 Coding Code Description E11.621 Type 2 diabetes mellitus with foot ulcer E11.42 Type 2 diabetes mellitus with diabetic polyneuropathy L97.512 Non-pressure chronic ulcer of other part of right foot with fat layer exposed Follow-up Appointments ppointment in 1 week. - tuesday Return A Dressing Change Frequency Do not change entire dressing for one week. Wound Cleansing May shower with protection. Primary Wound Dressing Wound #4 Right T Great oe Silver Collagen - moisten with normal saline or [place] jelly Secondary Dressing Wound #4 Right T Great oe Foam - foam donut Dry Gauze Off-Loading Wound #4 Right T Great oe Total Contact Cast to Right Lower Extremity Electronic Signature(s) Signed: 05/02/2020 4:57:00 PM By: Baltazar Najjar MD Signed: 05/03/2020 9:08:43 AM By: [name] RN Entered By: [name] on 05/02/2020 09:10:26 -------------------------------------------------------------------------------- Problem List Details Patient Name: Date of Service: [place], [name] 05/02/2020 8:30 A M Medical Record Number: [id number] Patient Account Number: [id number] Date of Birth/Sex: Treating RN: Oct 10, 1955 (64 y.o. Melonie [place] Primary Care Provider: Cynda Familia Other Clinician: Referring Provider: Treating Provider/Extender: Willia Craze in Treatment: 14 Active Problems ICD-10 Encounter Code Description Active Date MDM Diagnosis E11.621 Type 2 diabetes mellitus with foot ulcer 01/25/2020 No Yes E11.42 Type 2 diabetes mellitus with diabetic polyneuropathy 01/25/2020 No Yes L97.512 Non-pressure chronic ulcer of other part of right foot with fat layer exposed 01/25/2020 No Yes Inactive Problems Resolved Problems Electronic  Signature(s) Signed: 05/02/2020 4:57:00 PM By: Baltazar Najjar MD Entered By: Baltazar Najjar on 05/02/2020 10:01:52 -------------------------------------------------------------------------------- Progress Note Details Patient Name: Date of Service: [name], [name] 05/02/2020 8:30 A M Medical Record Number: [id number] Patient Account Number: [id number] Date of Birth/Sex: Treating RN: 1955-11-30 (64 y.o. Melonie [place] Primary Care Provider: Cynda Familia Other Clinician: Referring Provider: Treating Provider/Extender: Willia Craze in Treatment: 14 Subjective History of Present Illness (HPI) 12/16/18 on evaluation today patient presents for initial evaluation our clinic as a self-referral due to a wound that is had on his right first toe for 6-8 weeks. He states his last hemoglobin A1c which was roughly 4 months ago was 11.0 he knows he has not been taking care of himself in this regard. He's actually been out of medications for two weeks both in regard to his diabetes as well as his high blood pressure. He states he is going to see those doctors in the next week. Subsequently he actually told me that being here in the clinic and seeing so many patients that did have applications in such has really got him thinking about the fact that he needs to take better care of himself in this regard. Fortunately there's no signs of active infection at this time which is good news. No fevers, chills,  nausea, or vomiting noted at this time. He has been seen a local podiatrist for the past 6-8 weeks he was recommending for the patient that he soak in Epsom salts and come in on the patient tells me generally Tuesday and Thursday for debridement's and otherwise was told to leave the wound open to air just put a sock on and go about his business and that "it would scab up and be okay". With that being said the patient states when he realized things were not getting better and he  really did not agree with the treatment plan he decided to get a second opinion here the wound care center. Apparently he's had x-rays although the patient's podiatrist has not sent [place] records at this point I think we may want to send them to the hospital for an x-ray just to have everything documented before we see about what we need to do from the standpoint of more aggressive treatment such as potentially casting all over this be his right foot that's gonna be somewhat difficult in my pinion. 12/23/18 on evaluation today patient actually appears to be doing okay in regard to his plantar foot ulcer. Unfortunately he was not able to go get the x-ray during the last visit here in the office. Subsequently he states that he actually forgot to go do that in the interim between last week and this week. That is something is gonna go do as soon as he leaves here today. We discussed again that I do think the total contact cast would be the appropriate way to go in the best way to get the news to heal. With that being said beginning the x-ray clear before we proceed with the cast he understands. 6/26; patient here for obligatory total contact cast change no complaints 12/30/18 on evaluation today patient actually appears to be doing excellent in regard to his plantar foot wound. This is looking tremendously better there's new callous buildup it's not as deep and overall show signs of excellent improvement. I'm very pleased in this regard. Overall I think that he should continue with the Current wound care measures including the total contact cast as I believe this is gonna be most helpful in getting this to heal very quickly. 01/06/19 on evaluation today patient actually appears to be doing quite well with regard to his toe ulcer. This is shown signs of good improvement in fact is measuring smaller by about half of what it was last week. Nonetheless I think this is excellent improvement is filled in an overall is  making great progress. No fevers, chills, nausea, or vomiting noted at this time. 7/16; patient is here for review of a wound on the plantar aspect of his right great toe. Been using silver collagen under a total contact cast 01/20/19 on evaluation today patient actually presents for a slightly early follow-up due to the fact that his cast has been rubbing on the right medial ankle region. Unfortunately he has a new wound at this location because of the rubbing of the cast. There does not appear to be signs of active infection which is good news. No fevers, chills, nausea, or vomiting noted at this time. Overall been pleased with how things have progressed in regard to his toe again today this appears to be doing well. 02/01/19-Patient returns at 12 days being out of the cast on account of going to the beach, he has been using Prisma to his wounds on his right medial ankle and right  great toe plantar aspect. Both appear to be around the same dimensions. 02/08/19-Patient returns at 1 week after being in a cast both the wounds are better in fact the right great plantar wound is healed, right medial ankle wound looks better. We are using Prisma. Does not need to be in cast anymore 8/24; 2-week follow-up. Everything is healed here including the right great plantar toe and the cast injury on the medial ankle. He still has callus on the plantar right great toe. He has gone to [employer] I believe and has custom inserts at least. His wounds have closed over READMISSION 01/25/2020. This is a 64 year old man with poorly controlled type 2 diabetes with a recent hemoglobin A1c of 9.3. He also has diabetic neuropathy. We had him here extensively in 2020 again for a wound on the plantar aspect of the right great toe. Eventually we heal this over and a total contact cast. He got custom inserts for his running shoes at [employer]. He has been using them reliably. He states that roughly 3 to 4 weeks ago he developed a thick  callus on the bottom of the toe it eventually separated or split and underneath was an open wound. He saw his primary doctor who would and put him on doxycycline for 10 days. Past medical history type 2 diabetes with peripheral neuropathy, hyperlipidemia and hypertension. ABI in our clinic was 1.17 on the right 02/11/20-Patient back at 2 weeks, right great toe plantar wound is about the same with slight callus build up. 8/27; 2-week follow-up right. Aggressively debrided last time considerable callus buildup. We have been using silver collagen. This is definitely going to require a total contact cast however a week today his daughter gets married we elected to delay that until the Monday after that I E 10 days from now. 9/7; right plantar toe. Aggressively debrided last time and the wound actually looks some better with less depth. 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 In view of this I did not put a total contact cast on. Also noted he is having marked indentation of his right great toe in the surgical shoe he is wearing 9/14; right plantar great toe. Surface of the wound looks healthy again he has overriding skin. He is leaving for [place] on vacation on 9/24, after this I think he will require a total contact cast. We started him off on silver collagen for some reason he changed to silver alginate I am going to change him back to silver collagen today 10/5; right plantar great toe. He is returned from [place] he was not offloading this at all I am not exactly sure whether he was even dressing this. Arrives with extensive undermining from the wound on the plantar aspect of the right great toe. He has a surgical shoe but nothing else no felt etc. 10/12; right plantar great toe. The wound does not look unhealthy however its has thick edges overhanging skin. He is ready for a total contact cast. Still using silver collagen as the  primary dressing 10/15; the patient is here for the obligatory first total contact cast change. This was done in the standard fashion the wound was not examined 10/19 wound bed looks smaller but still some depth. Healthy granulation. Using silver collagen under a total contact cast 10/26; small wound minimal depth no undermining or using silver collagen under a total contact cast 11/2; after debridement today of  callus subcutaneous tissue from the wound margin there is only a very tiny open area remaining. Continue silver collagen under total contact cast should be closed by next week Objective Constitutional Patient is hypertensive.. Pulse regular and within target range for patient.Marland Kitchen Respirations regular, non-labored and within target range.. Temperature is normal and within the target range for the patient.Marland Kitchen Appears in no distress. Vitals Time Taken: 8:36 AM, Height: 70 in, Weight: 222 lbs, BMI: 31.9, Temperature: 98.3 F, Pulse: 81 bpm, Respiratory Rate: 16 breaths/min, Blood Pressure: 180/93 mmHg, Capillary Blood Glucose: 160 mg/dl. General Notes: Wound exam right plantar great toe. I removed thick callus subcutaneous tissue from around the wound margins. After debridement only a very superficial area with no depth remains. Tissue looks healthy. A bit surprising that there is so much nonviable surface that this woman the fact these been in a total contact cast although I am aware sometimes this takes some time for reverse Integumentary (Hair, Skin) Wound #4 status is Open. Original cause of wound was Blister. The wound is located on the Right T Great. The wound measures 0.2cm length x 0.2cm width oe x 0.1cm depth; 0.031cm^2 area and 0.003cm^3 volume. There is Fat Layer (Subcutaneous Tissue) exposed. There is no tunneling or undermining noted. There is a small amount of serosanguineous drainage noted. The wound margin is well defined and not attached to the wound base. There is no granulation  within the wound bed. There is no necrotic tissue within the wound bed. General Notes: callus noted. Assessment Active Problems ICD-10 Type 2 diabetes mellitus with foot ulcer Type 2 diabetes mellitus with diabetic polyneuropathy Non-pressure chronic ulcer of other part of right foot with fat layer exposed Procedures Wound #4 Pre-procedure diagnosis of Wound #4 is a Diabetic Wound/Ulcer of the Lower Extremity located on the Right T Great .Severity of Tissue Pre Debridement is: oe Fat layer exposed. There was a Excisional Skin/Subcutaneous Tissue Debridement with a total area of 0.04 sq cm performed by Maxwell Caul., MD. With the following instrument(s): Curette to remove Viable and Non-Viable tissue/material. Material removed includes Callus, Subcutaneous Tissue, and Slough. No specimens were taken. A time out was conducted at 09:14, prior to the start of the procedure. A Moderate amount of bleeding was controlled with Silver Nitrate. The procedure was tolerated well with a pain level of 0 throughout and a pain level of 0 following the procedure. Post Debridement Measurements: 0.2cm length x 0.2cm width x 0.1cm depth; 0.003cm^3 volume. Character of Wound/Ulcer Post Debridement is improved. Severity of Tissue Post Debridement is: Fat layer exposed. Post procedure Diagnosis Wound #4: Same as Pre-Procedure Pre-procedure diagnosis of Wound #4 is a Diabetic Wound/Ulcer of the Lower Extremity located on the Right T Great . There was a T Contact Cast oe otal Procedure by Maxwell Caul., MD. Post procedure Diagnosis Wound #4: Same as Pre-Procedure Plan Follow-up Appointments: Return Appointment in 1 week. - tuesday Dressing Change Frequency: Do not change entire dressing for one week. Wound Cleansing: May shower with protection. Primary Wound Dressing: Wound #4 Right T Great: oe Silver Collagen - moisten with normal saline or KY jelly Secondary Dressing: Wound #4 Right T  Great: oe Foam - foam donut Dry Gauze Off-Loading: Wound #4 Right T Great: oe T Contact Cast to Right Lower Extremity otal 1. We continue with silver collagen, gauze under a total contact cast 2. Should be closed by next week Electronic Signature(s) Signed: 05/02/2020 4:57:00 PM By: Baltazar Najjar MD Entered By: Baltazar Najjar  on 05/02/2020 10:04:53 -------------------------------------------------------------------------------- Total Contact Cast Details Patient Name: Date of Service: [place] 05/02/2020 8:30 A M Medical Record Number: [id number] Patient Account Number: [id number] Date of Birth/Sex: Treating RN: Nov 18, 1955 (64 y.o. Judie PetitM) Yevonne PaxEpps, Carrie Primary Care Provider: Cynda FamiliaMeyers, Stephen C Other Clinician: Referring Provider: Treating Provider/Extender: Willia Crazeobson, Abhijay Morriss Meyers, Stephen C Weeks in Treatment: 14 T Contact Cast Applied for Wound Assessment: otal Wound #4 Right T Great oe Performed By: Physician Maxwell Caulobson, Revecca Nachtigal G., MD Post Procedure Diagnosis Same as Pre-procedure Electronic Signature(s) Signed: 05/02/2020 4:57:00 PM By: Baltazar Najjarobson, Jene Huq MD Entered By: Baltazar Najjarobson, Phong Isenberg on 05/02/2020 10:02:07 -------------------------------------------------------------------------------- SuperBill Details Patient Name: Date of Service: [name] 05/02/2020 Medical Record Number: [id number] Patient Account Number: [id number] Date of Birth/Sex: Treating RN: Nov 18, 1955 (64 y.o. 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 Facility Procedures CPT4 Code: [id number] Description: 11042 - DEB SUBQ  TISSUE 20 SQ CM/< ICD-10 Diagnosis Description L97.512 Non-pressure chronic ulcer of other part of right foot with fat layer exposed Modifier: Quantity: 1 Physician Procedures : CPT4 Code Description Modifier [id number] 11042 - WC PHYS SUBQ TISS 20 SQ CM ICD-10 Diagnosis Description L97.512 Non-pressure chronic ulcer of other part of right foot with fat layer exposed Quantity: 1 Electronic Signature(s) Signed: 05/02/2020 4:57:00 PM By: Baltazar Najjarobson, Yovanny Coats MD Entered By: Baltazar Najjarobson, Antwoin Lackey on 05/02/2020 10:05:11

## 2020-05-04 NOTE — Progress Notes (Signed)
KHIEM, GARGIS ([id number]) Visit Report for 05/02/2020 Arrival Information Details Patient Name: Date of Service: [place], [place] 05/02/2020 8:30 A M Medical Record Number: [id number] Patient Account Number: [id number] Date of Birth/Sex: Treating RN: [birth year] (64 y.o. Hessie Diener Primary Care Jleigh Striplin: Howie Ill Other Clinician: Referring Brandice Busser: Treating Samuella Rasool/Extender: Aldine Contes in Treatment: 14 Visit Information History Since Last Visit Added or deleted any medications: No Patient Arrived: Ambulatory Any new allergies or adverse reactions: No Arrival Time: 08:25 Had a fall or experienced change in No Accompanied By: self activities of daily living that may affect Transfer Assistance: None risk of falls: Patient Identification Verified: Yes Signs or symptoms of abuse/neglect since last visito No Secondary Verification Process Completed: Yes Hospitalized since last visit: No Patient Requires Transmission-Based Precautions: No Implantable device outside of the clinic excluding No Patient Has Alerts: No cellular tissue based products placed in the center since last visit: Has Dressing in Place as Prescribed: Yes Has Footwear/Offloading in Place as Prescribed: Yes Right: T Contact Cast otal Pain Present Now: No Electronic Signature(s) Signed: 05/02/2020 4:26:11 PM By: Deon Pilling Entered By: Deon Pilling on 05/02/2020 08:35:52 -------------------------------------------------------------------------------- Encounter Discharge Information Details Patient Name: Date of Service: [place] Kung, [place] 05/02/2020 8:30 A M Medical Record Number: [id number] Patient Account Number: [id number] Date of Birth/Sex: Treating RN: [birth year] (64 y.o. Janyth Contes Primary Care Jazzlynn Rawe: Howie Ill Other Clinician: Referring Emannuel Vise: Treating Valentine Barney/Extender: Aldine Contes in Treatment: 14 Encounter  Discharge Information Items Post Procedure Vitals Discharge Condition: Stable Temperature (F): 98.3 Ambulatory Status: Ambulatory Pulse (bpm): 81 Discharge Destination: Home Respiratory Rate (breaths/min): 16 Transportation: Private Auto Blood Pressure (mmHg): 180/93 Accompanied By: alone Schedule Follow-up Appointment: Yes Clinical Summary of Care: Patient Declined Electronic Signature(s) Signed: 05/04/2020 5:54:11 PM By: Levan Hurst RN, BSN Entered By: Levan Hurst on 05/02/2020 12:16:58 -------------------------------------------------------------------------------- Lower Extremity Assessment Details Patient Name: Date of Service: Metro Kung, [place] 05/02/2020 8:30 A M Medical Record Number: [id number] Patient Account Number: [id number] Date of Birth/Sex: Treating RN: [birth year] (64 y.o. Hessie Diener Primary Care Alfonso Carden: Howie Ill Other Clinician: Referring Kyrese Gartman: Treating Julena Barbour/Extender: Aldine Contes in Treatment: 14 Edema Assessment Assessed: Shirlyn Goltz: No] Patrice Paradise: Yes] Edema: [Left: N] [Right: o] Calf Left: Right: Point of Measurement: From Medial Instep 37 cm Ankle Left: Right: Point of Measurement: From Medial Instep 22 cm Vascular Assessment Pulses: Dorsalis Pedis Palpable: [Right:Yes] Electronic Signature(s) Signed: 05/02/2020 4:26:11 PM By: Deon Pilling Entered By: Deon Pilling on 05/02/2020 08:37:28 -------------------------------------------------------------------------------- Multi Wound Chart Details Patient Name: Date of Service: Metro Kung, [place] 05/02/2020 8:30 A M Medical Record Number: [id number] Patient Account Number: [id number] Date of Birth/Sex: Treating RN: [birth year] (64 y.o. Jerilynn Mages) Carlene Coria Primary Care Ekta Dancer: Howie Ill Other Clinician: Referring Quintus Premo: Treating Johngabriel Verde/Extender: Aldine Contes in Treatment: 14 Vital Signs Height(in): 70 Capillary Blood  Glucose(mg/dl): 160 Weight(lbs): 222 Pulse(bpm): 46 Body Mass Index(BMI): 32 Blood Pressure(mmHg): 180/93 Temperature(F): 98.3 Respiratory Rate(breaths/min): 16 Photos: [4:No Photos Right T Great oe] [N/A:N/A N/A] Wound Location: [4:Blister] [N/A:N/A] Wounding Event: [4:Diabetic Wound/Ulcer of the Lower] [N/A:N/A] Primary Etiology: [4:Extremity Cataracts, Hypertension, Type II] [N/A:N/A] Comorbid History: [4:Diabetes, Neuropathy, Confinement Anxiety 12/24/2019] [N/A:N/A] Date Acquired: [4:14] [N/A:N/A] Weeks of Treatment: [4:Open] [N/A:N/A] Wound Status: [4:0.2x0.2x0.1] [N/A:N/A] Measurements L x W x D (cm) [4:0.031] [N/A:N/A] A (cm) : rea [4:0.003] [N/A:N/A] Volume (cm) : [4:78.00%] [N/A:N/A] % Reduction in A rea: [4:95.80%] [N/A:N/A] % Reduction in Volume: [  4:Grade 2] [N/A:N/A] Classification: [4:Small] [N/A:N/A] Exudate A mount: [4:Serosanguineous] [N/A:N/A] Exudate Type: [4:red, brown] [N/A:N/A] Exudate Color: [4:Well defined, not attached] [N/A:N/A] Wound Margin: [4:None Present (0%)] [N/A:N/A] Granulation A mount: [4:None Present (0%)] [N/A:N/A] Necrotic A mount: [4:Fat Layer (Subcutaneous Tissue): Yes N/A] Exposed Structures: [4:Fascia: No Tendon: No Muscle: No Joint: No Bone: No Large (67-100%)] [N/A:N/A] Epithelialization: [4:Debridement - Excisional] [N/A:N/A] Debridement: Pre-procedure Verification/Time Out 09:14 [N/A:N/A] Taken: [4:Callus, Subcutaneous, Slough] [N/A:N/A] Tissue Debrided: [4:Skin/Subcutaneous Tissue] [N/A:N/A] Level: [4:0.04] [N/A:N/A] Debridement A (sq cm): [4:rea Curette] [N/A:N/A] Instrument: [4:Moderate] [N/A:N/A] Bleeding: [4:Silver Nitrate] [N/A:N/A] Hemostasis A chieved: [4:0] [N/A:N/A] Procedural Pain: [4:0] [N/A:N/A] Post Procedural Pain: [4:Procedure was tolerated well] [N/A:N/A] Debridement Treatment Response: [4:0.2x0.2x0.1] [N/A:N/A] Post Debridement Measurements L x W x D (cm) [4:0.003] [N/A:N/A] Post Debridement Volume: (cm)  [4:callus noted.] [N/A:N/A] Assessment Notes: [4:Debridement] [N/A:N/A] Procedures Performed: [4:T Contact Cast otal] Treatment Notes Electronic Signature(s) Signed: 05/02/2020 4:57:00 PM By: Linton Ham MD Signed: 05/03/2020 9:08:43 AM By: Carlene Coria RN Entered By: Linton Ham on 05/02/2020 10:01:59 -------------------------------------------------------------------------------- Multi-Disciplinary Care Plan Details Patient Name: Date of Service: [place], [place] 05/02/2020 8:30 A M Medical Record Number: [id number] Patient Account Number: [id number] Date of Birth/Sex: Treating RN: 1955-12-05 (64 y.o. Oval Linsey Primary Care Virda Betters: Howie Ill Other Clinician: Referring Aaric Dolph: Treating Tyniya Kuyper/Extender: Aldine Contes in Treatment: 14 Active Inactive Wound/Skin Impairment Nursing Diagnoses: Knowledge deficit related to ulceration/compromised skin integrity Goals: Patient/caregiver will verbalize understanding of skin care regimen Date Initiated: 01/25/2020 Target Resolution Date: 05/18/2020 Goal Status: Active Ulcer/skin breakdown will have a volume reduction of 30% by week 4 Date Initiated: 01/25/2020 Date Inactivated: 04/04/2020 Target Resolution Date: 03/17/2020 Goal Status: Met Ulcer/skin breakdown will have a volume reduction of 50% by week 8 Date Initiated: 04/04/2020 Date Inactivated: 04/11/2020 Target Resolution Date: 04/17/2020 Goal Status: Met Ulcer/skin breakdown will have a volume reduction of 80% by week 12 Date Initiated: 04/11/2020 Target Resolution Date: 05/18/2020 Goal Status: Active Interventions: Assess patient/caregiver ability to obtain necessary supplies Assess patient/caregiver ability to perform ulcer/skin care regimen upon admission and as needed Assess ulceration(s) every visit Notes: Electronic Signature(s) Signed: 05/03/2020 9:08:43 AM By: Carlene Coria RN Entered By: Carlene Coria on 05/02/2020  09:10:45 -------------------------------------------------------------------------------- Pain Assessment Details Patient Name: Date of Service: [place], [place] 05/02/2020 8:30 A M Medical Record Number: [id number] Patient Account Number: [id number] Date of Birth/Sex: Treating RN: 1956/05/01 (64 y.o. Hessie Diener Primary Care Mahagony Grieb: Howie Ill Other Clinician: Referring Mannix Kroeker: Treating Brenon Antosh/Extender: Aldine Contes in Treatment: 14 Active Problems Location of Pain Severity and Description of Pain Patient Has Paino No Site Locations Rate the pain. Current Pain Level: 0 Pain Management and Medication Current Pain Management: Medication: No Cold Application: No Rest: No Massage: No Activity: No T.E.N.S.: No Heat Application: No Leg drop or elevation: No Is the Current Pain Management Adequate: Adequate How does your wound impact your activities of daily livingo Sleep: No Bathing: No Appetite: No Relationship With Others: No Bladder Continence: No Emotions: No Bowel Continence: No Work: No Toileting: No Drive: No Dressing: No Hobbies: No Electronic Signature(s) Signed: 05/02/2020 4:26:11 PM By: Deon Pilling Entered By: Deon Pilling on 05/02/2020 08:37:20 -------------------------------------------------------------------------------- Patient/Caregiver Education Details Patient Name: Date of Service: [place] Kung, [name] 11/2/2021andnbsp8:30 [place] Record Number: [id number] Patient Account Number: [id number] Date of Birth/Gender: Treating RN: 12/25/1955 (64 y.o. Oval Linsey Primary Care Physician: Howie Ill Other Clinician: Referring Physician: Treating Physician/Extender: Sheran Luz  Weeks in Treatment: 14 Education Assessment Education Provided To: Patient Education Topics Provided Wound/Skin Impairment: Methods: Explain/Verbal Responses: State content correctly [employer]) Signed: 05/03/2020 9:08:43 AM By: Carlene Coria RN Entered By: Carlene Coria on 05/02/2020 09:11:02 -------------------------------------------------------------------------------- Wound Assessment Details Patient Name: Date of Service: [place], [place] 05/02/2020 8:30 A M Medical Record Number: [id number] Patient Account Number: [id number] Date of Birth/Sex: Treating RN: Mar 07, 1956 (64 y.o. Lorette Ang, Meta.Reding Primary Care Stephanye [place]: Howie Ill Other Clinician: Referring Prayan Ulin: Treating Tavoris Brisk/Extender: Aldine Contes in Treatment: 14 Wound Status Wound Number: 4 Primary Diabetic Wound/Ulcer of the Lower Extremity Etiology: Wound Location: Right T Great oe Wound Status: Open Wounding Event: Blister Comorbid Cataracts, Hypertension, Type II Diabetes, Neuropathy, Date Acquired: 12/24/2019 History: Confinement Anxiety Weeks Of Treatment: 14 Clustered Wound: No Wound Measurements Length: (cm) 0.2 Width: (cm) 0.2 Depth: (cm) 0.1 Area: (cm) 0.031 Volume: (cm) 0.003 % Reduction in Area: 78% % Reduction in Volume: 95.8% Epithelialization: Large (67-100%) Tunneling: No Undermining: No Wound Description Classification: Grade 2 Wound Margin: Well defined, not attached Exudate Amount: Small Exudate Type: Serosanguineous Exudate Color: red, brown Foul Odor After Cleansing: No Slough/Fibrino No Wound Bed Granulation Amount: None Present (0%) Exposed Structure Necrotic Amount: None Present (0%) Fascia Exposed: No Fat Layer (Subcutaneous Tissue) Exposed: Yes Tendon Exposed: No Muscle Exposed: No Joint Exposed: No Bone Exposed: No Assessment Notes callus noted. Treatment Notes Wound #4 (Right Toe Great) 1. Cleanse With Wound Cleanser 3. Primary Dressing Applied [employer] 4. Secondary Dressing Dry Gauze Roll Gauze Foam 7. Footwear/Offloading device applied T Contact Cast otal Notes foam donut as secondary. last layer of  TCC applied by MD. Electronic Signature(s) Signed: 05/02/2020 4:26:11 PM By: Deon Pilling Entered By: Deon Pilling on 05/02/2020 08:37:53 -------------------------------------------------------------------------------- Vitals Details Patient Name: Date of Service: Metro Kung, [place] 05/02/2020 8:30 A M Medical Record Number: [id number] Patient Account Number: [id number] Date of Birth/Sex: Treating RN: [birth year] (64 y.o. Lorette Ang, Meta.Reding Primary Care Boe Deans: Howie Ill Other Clinician: Referring Joselynne Killam: Treating Koltyn Kelsay/Extender: Aldine Contes in Treatment: 14 Vital Signs Time Taken: 08:36 Temperature (F): 98.3 Height (in): 70 Pulse (bpm): 81 Weight (lbs): 222 Respiratory Rate (breaths/min): 16 Body Mass Index (BMI): 31.9 Blood Pressure (mmHg): 180/93 Capillary Blood Glucose (mg/dl): 160 Reference Range: 80 - 120 mg / dl Electronic Signature(s) Signed: 05/02/2020 4:26:11 PM By: Deon Pilling Entered By: Deon Pilling on 05/02/2020 08:37:10

## 2020-05-09 ENCOUNTER — Encounter (HOSPITAL_BASED_OUTPATIENT_CLINIC_OR_DEPARTMENT_OTHER): Payer: 59 | Admitting: Internal Medicine

## 2020-05-09 ENCOUNTER — Other Ambulatory Visit: Payer: Self-pay

## 2020-05-09 DIAGNOSIS — E11621 Type 2 diabetes mellitus with foot ulcer: Secondary | ICD-10-CM | POA: Diagnosis not present

## 2020-05-10 NOTE — Progress Notes (Signed)
[name], [name] ([id number]) Visit Report for 05/09/2020 Debridement Details Patient Name: Date of Service: [place], [name] 05/09/2020 8:30 A M Medical Record Number: [id number] Patient Account Number: [id number] Date of Birth/Sex: Treating RN: May 03, 1956 (64 y.o. [name]) [name] Primary Care Provider: Cynda Familia Other Clinician: Referring Provider: Treating Provider/Extender: Willia Craze in Treatment: 15 Debridement Performed for Assessment: Wound #4 Right T Great oe Performed By: Physician Maxwell Caul., MD Debridement Type: Debridement Severity of Tissue Pre Debridement: Fat layer exposed Level of Consciousness (Pre-procedure): Awake and Alert Pre-procedure Verification/Time Out Yes - 08:51 Taken: Start Time: 08:51 T Area Debrided (L x W): otal 0.3 (cm) x 0.3 (cm) = 0.09 (cm) Tissue and other material debrided: Viable, Non-Viable, Callus, Skin: Dermis , Skin: Epidermis Level: Skin/Epidermis Debridement Description: Selective/Open Wound Instrument: Curette Bleeding: Moderate Hemostasis Achieved: Silver Nitrate End Time: 08:54 Procedural Pain: 0 Post Procedural Pain: 0 Response to Treatment: Procedure was tolerated well Level of Consciousness (Post- Awake and Alert procedure): Post Debridement Measurements of Total Wound Length: (cm) 0.3 Width: (cm) 0.3 Depth: (cm) 0.1 Volume: (cm) 0.007 Character of Wound/Ulcer Post Debridement: Improved Severity of Tissue Post Debridement: Fat layer exposed Post Procedure Diagnosis Same as Pre-procedure Electronic Signature(s) Signed: 05/10/2020 9:36:19 AM By: Baltazar Najjar MD Signed: 05/10/2020 5:51:14 PM By: [name] RN Entered By: Baltazar Najjar on 05/09/2020 09:00:47 -------------------------------------------------------------------------------- HPI Details Patient Name: Date of Service: Todd Price, [name] 05/09/2020 8:30 A M Medical Record Number: [id number] Patient Account Number:  [id number] Date of Birth/Sex: Treating RN: 1955-07-14 (64 y.o. Melonie [place] Primary Care Provider: Cynda Familia Other Clinician: Referring Provider: Treating Provider/Extender: Willia Craze in Treatment: 15 History of Present Illness HPI Description: 12/16/18 on evaluation today patient presents for initial evaluation our clinic as a self-referral due to a wound that is had on his right first toe for 6-8 weeks. He states his last hemoglobin A1c which was roughly 4 months ago was 11.0 he knows he has not been taking care of himself in this regard. He's actually been out of medications for two weeks both in regard to his diabetes as well as his high blood pressure. He states he is going to see those doctors in the next week. Subsequently he actually told me that being here in the clinic and seeing so many patients that did have applications in such has really got him thinking about the fact that he needs to take better care of himself in this regard. Fortunately there's no signs of active infection at this time which is good news. No fevers, chills, nausea, or vomiting noted at this time. He has been seen a local podiatrist for the past 6-8 weeks he was recommending for the patient that he soak in Epsom salts and come in on the patient tells me generally Tuesday and Thursday for debridement's and otherwise was told to leave the wound open to air just put a sock on and go about his business and that "it would scab up and be okay". With that being said the patient states when he realized things were not getting better and he really did not agree with the treatment plan he decided to get a second opinion here the wound care center. Apparently he's had x-rays although the patient's podiatrist has not sent [place] records at this point I think we may want to send them to the hospital for an x-ray just to have everything documented before we see about what  we need to do from the  standpoint of more aggressive treatment such as potentially casting all over this be his right foot that's gonna be somewhat difficult in my pinion. 12/23/18 on evaluation today patient actually appears to be doing okay in regard to his plantar foot ulcer. Unfortunately he was not able to go get the x-ray during the last visit here in the office. Subsequently he states that he actually forgot to go do that in the interim between last week and this week. That is something is gonna go do as soon as he leaves here today. We discussed again that I do think the total contact cast would be the appropriate way to go in the best way to get the news to heal. With that being said beginning the x-ray clear before we proceed with the cast he understands. 6/26; patient here for obligatory total contact cast change no complaints 12/30/18 on evaluation today patient actually appears to be doing excellent in regard to his plantar foot wound. This is looking tremendously better there's new callous buildup it's not as deep and overall show signs of excellent improvement. I'm very pleased in this regard. Overall I think that he should continue with the Current wound care measures including the total contact cast as I believe this is gonna be most helpful in getting this to heal very quickly. 01/06/19 on evaluation today patient actually appears to be doing quite well with regard to his toe ulcer. This is shown signs of good improvement in fact is measuring smaller by about half of what it was last week. Nonetheless I think this is excellent improvement is filled in an overall is making great progress. No fevers, chills, nausea, or vomiting noted at this time. 7/16; patient is here for review of a wound on the plantar aspect of his right great toe. Been using silver collagen under a total contact cast 01/20/19 on evaluation today patient actually presents for a slightly early follow-up due to the fact that his cast has been  rubbing on the right medial ankle region. Unfortunately he has a new wound at this location because of the rubbing of the cast. There does not appear to be signs of active infection which is good news. No fevers, chills, nausea, or vomiting noted at this time. Overall been pleased with how things have progressed in regard to his toe again today this appears to be doing well. 02/01/19-Patient returns at 12 days being out of the cast on account of going to the beach, he has been using Prisma to his wounds on his right medial ankle and right great toe plantar aspect. Both appear to be around the same dimensions. 02/08/19-Patient returns at 1 week after being in a cast both the wounds are better in fact the right great plantar wound is healed, right medial ankle wound looks better. We are using Prisma. Does not need to be in cast anymore 8/24; 2-week follow-up. Everything is healed here including the right great plantar toe and the cast injury on the medial ankle. He still has callus on the plantar right great toe. He has gone to [employer] I believe and has custom inserts at least. His wounds have closed over READMISSION 01/25/2020. This is a 65 year old man with poorly controlled type 2 diabetes with a recent hemoglobin A1c of 9.3. He also has diabetic neuropathy. We had him here extensively in 2020 again for a wound on the plantar aspect of the right great toe. Eventually we heal this  over and a total contact cast. He got custom inserts for his running shoes at [employer]. He has been using them reliably. He states that roughly 3 to 4 weeks ago he developed a thick callus on the bottom of the toe it eventually separated or split and underneath was an open wound. He saw his primary doctor who would and put him on doxycycline for 10 days. Past medical history type 2 diabetes with peripheral neuropathy, hyperlipidemia and hypertension. ABI in our clinic was 1.17 on the right 02/11/20-Patient back at 2 weeks,  right great toe plantar wound is about the same with slight callus build up. 8/27; 2-week follow-up right. Aggressively debrided last time considerable callus buildup. We have been using silver collagen. This is definitely going to require a total contact cast however a week today his daughter gets married we elected to delay that until the Monday after that I E 10 days from now. 9/7; right plantar toe. Aggressively debrided last time and the wound actually looks some better with less depth. 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 In view of this I did not put a total contact cast on. Also noted he is having marked indentation of his right great toe in the surgical shoe he is wearing 9/14; right plantar great toe. Surface of the wound looks healthy again he has overriding skin. He is leaving for [place] on vacation on 9/24, after this I think he will require a total contact cast. We started him off on silver collagen for some reason he changed to silver alginate I am going to change him back to silver collagen today 10/5; right plantar great toe. He is returned from [place] he was not offloading this at all I am not exactly sure whether he was even dressing this. Arrives with extensive undermining from the wound on the plantar aspect of the right great toe. He has a surgical shoe but nothing else no felt etc. 10/12; right plantar great toe. The wound does not look unhealthy however its has thick edges overhanging skin. He is ready for a total contact cast. Still using silver collagen as the primary dressing 10/15; the patient is here for the obligatory first total contact cast change. This was done in the standard fashion the wound was not examined 10/19 wound bed looks smaller but still some depth. Healthy granulation. Using silver collagen under a total contact cast 10/26; small wound minimal depth no undermining or using silver  collagen under a total contact cast 11/2; after debridement today of callus subcutaneous tissue from the wound margin there is only a very tiny open area remaining. Continue silver collagen under total contact cast should be closed by next week 11/9; still thick callus around the wound margin and this is still open perhaps slightly larger than last week. Cast is not in good shape when he arrives back in the clinic. We have been using silver collagen as the primary dressing I changed to [employer]) Signed: 05/10/2020 9:36:19 AM By: Baltazar Najjar MD Entered By: Baltazar Najjar on 05/09/2020 09:01:29 -------------------------------------------------------------------------------- Physical Exam Details Patient Name: Date of Service: Todd Price, [name] 05/09/2020 8:30 A M Medical Record Number: [id number] Patient Account Number: [id number] Date of Birth/Sex: Treating RN: 13-Aug-1955 (64 y.o. Melonie [place] Primary Care Provider: Cynda Familia Other Clinician: Referring Provider: Treating Provider/Extender: Willia Craze in Treatment: 15 Constitutional Patient is  hypertensive.. Pulse regular and within target range for patient.Marland Kitchen Respirations regular, non-labored and within target range.. Temperature is normal and within the target range for the patient.Marland Kitchen Appears in no distress. Cardiovascular No pulses are palpable. Notes Wound exam; right plantar great toe. Again thick callus around the wound margins dry flaking skin. The wound actually measures larger. I do not know that we are supporting the toe enough Electronic Signature(s) Signed: 05/10/2020 9:36:19 AM By: Baltazar Najjar MD Entered By: Baltazar Najjar on 05/09/2020 09:03:01 -------------------------------------------------------------------------------- Physician Orders Details Patient Name: Date of Service: [place], [name] 05/09/2020 8:30 A M Medical Record Number:  [id number] Patient Account Number: [id number] Date of Birth/Sex: Treating RN: 1955-08-10 (64 y.o. Melonie [place] Primary Care Provider: Cynda Familia Other Clinician: Referring Provider: Treating Provider/Extender: Willia Craze in Treatment: 15 Verbal / Phone Orders: No Diagnosis Coding ICD-10 Coding Code Description E11.621 Type 2 diabetes mellitus with foot ulcer E11.42 Type 2 diabetes mellitus with diabetic polyneuropathy L97.512 Non-pressure chronic ulcer of other part of right foot with fat layer exposed Follow-up Appointments ppointment in 1 week. - tuesday Return A Dressing Change Frequency Do not change entire dressing for one week. Wound Cleansing May shower with protection. Primary Wound Dressing Wound #4 Right T Great oe Hydrofera Blue Secondary Dressing Wound #4 Right T Great oe Foam - foam donut Dry Gauze Other: - felt over foam donut for padding Off-Loading Wound #4 Right T Great oe Total Contact Cast to Right Lower Extremity Electronic Signature(s) Signed: 05/10/2020 9:36:19 AM By: Baltazar Najjar MD Signed: 05/10/2020 5:51:14 PM By: [name] RN Entered By: [name] on 05/09/2020 08:55:29 -------------------------------------------------------------------------------- Problem List Details Patient Name: Date of Service: [place], [name] 05/09/2020 8:30 A M Medical Record Number: [id number] Patient Account Number: [id number] Date of Birth/Sex: Treating RN: 12-21-1955 (64 y.o. Melonie [place] Primary Care Provider: Cynda Familia Other Clinician: Referring Provider: Treating Provider/Extender: Willia Craze in Treatment: 15 Active Problems ICD-10 Encounter Code Description Active Date MDM Diagnosis E11.621 Type 2 diabetes mellitus with foot ulcer 01/25/2020 No Yes E11.42 Type 2 diabetes mellitus with diabetic polyneuropathy 01/25/2020 No Yes L97.512 Non-pressure chronic ulcer of other  part of right foot with fat layer exposed 01/25/2020 No Yes Inactive Problems Resolved Problems Electronic Signature(s) Signed: 05/10/2020 9:36:19 AM By: Baltazar Najjar MD Entered By: Baltazar Najjar on 05/09/2020 08:59:45 -------------------------------------------------------------------------------- Progress Note Details Patient Name: Date of Service: Todd Price, [name] 05/09/2020 8:30 A M Medical Record Number: [id number] Patient Account Number: [id number] Date of Birth/Sex: Treating RN: 08-26-1955 (64 y.o. Melonie [place] Primary Care Provider: Cynda Familia Other Clinician: Referring Provider: Treating Provider/Extender: Willia Craze in Treatment: 15 Subjective History of Present Illness (HPI) 12/16/18 on evaluation today patient presents for initial evaluation our clinic as a self-referral due to a wound that is had on his right first toe for 6-8 weeks. He states his last hemoglobin A1c which was roughly 4 months ago was 11.0 he knows he has not been taking care of himself in this regard. He's actually been out of medications for two weeks both in regard to his diabetes as well as his high blood pressure. He states he is going to see those doctors in the next week. Subsequently he actually told me that being here in the clinic and seeing so many patients that did have applications in such has really got him thinking about the fact that he needs to take better care  of himself in this regard. Fortunately there's no signs of active infection at this time which is good news. No fevers, chills, nausea, or vomiting noted at this time. He has been seen a local podiatrist for the past 6-8 weeks he was recommending for the patient that he soak in Epsom salts and come in on the patient tells me generally Tuesday and Thursday for debridement's and otherwise was told to leave the wound open to air just put a sock on and go about his business and that "it would scab up and  be okay". With that being said the patient states when he realized things were not getting better and he really did not agree with the treatment plan he decided to get a second opinion here the wound care center. Apparently he's had x-rays although the patient's podiatrist has not sent [place] records at this point I think we may want to send them to the hospital for an x-ray just to have everything documented before we see about what we need to do from the standpoint of more aggressive treatment such as potentially casting all over this be his right foot that's gonna be somewhat difficult in my pinion. 12/23/18 on evaluation today patient actually appears to be doing okay in regard to his plantar foot ulcer. Unfortunately he was not able to go get the x-ray during the last visit here in the office. Subsequently he states that he actually forgot to go do that in the interim between last week and this week. That is something is gonna go do as soon as he leaves here today. We discussed again that I do think the total contact cast would be the appropriate way to go in the best way to get the news to heal. With that being said beginning the x-ray clear before we proceed with the cast he understands. 6/26; patient here for obligatory total contact cast change no complaints 12/30/18 on evaluation today patient actually appears to be doing excellent in regard to his plantar foot wound. This is looking tremendously better there's new callous buildup it's not as deep and overall show signs of excellent improvement. I'm very pleased in this regard. Overall I think that he should continue with the Current wound care measures including the total contact cast as I believe this is gonna be most helpful in getting this to heal very quickly. 01/06/19 on evaluation today patient actually appears to be doing quite well with regard to his toe ulcer. This is shown signs of good improvement in fact is measuring smaller by about half  of what it was last week. Nonetheless I think this is excellent improvement is filled in an overall is making great progress. No fevers, chills, nausea, or vomiting noted at this time. 7/16; patient is here for review of a wound on the plantar aspect of his right great toe. Been using silver collagen under a total contact cast 01/20/19 on evaluation today patient actually presents for a slightly early follow-up due to the fact that his cast has been rubbing on the right medial ankle region. Unfortunately he has a new wound at this location because of the rubbing of the cast. There does not appear to be signs of active infection which is good news. No fevers, chills, nausea, or vomiting noted at this time. Overall been pleased with how things have progressed in regard to his toe again today this appears to be doing well. 02/01/19-Patient returns at 12 days being out of the cast  on account of going to the beach, he has been using Prisma to his wounds on his right medial ankle and right great toe plantar aspect. Both appear to be around the same dimensions. 02/08/19-Patient returns at 1 week after being in a cast both the wounds are better in fact the right great plantar wound is healed, right medial ankle wound looks better. We are using Prisma. Does not need to be in cast anymore 8/24; 2-week follow-up. Everything is healed here including the right great plantar toe and the cast injury on the medial ankle. He still has callus on the plantar right great toe. He has gone to [employer] I believe and has custom inserts at least. His wounds have closed over READMISSION 01/25/2020. This is a 64 year old man with poorly controlled type 2 diabetes with a recent hemoglobin A1c of 9.3. He also has diabetic neuropathy. We had him here extensively in 2020 again for a wound on the plantar aspect of the right great toe. Eventually we heal this over and a total contact cast. He got custom inserts for his running shoes at  [employer]. He has been using them reliably. He states that roughly 3 to 4 weeks ago he developed a thick callus on the bottom of the toe it eventually separated or split and underneath was an open wound. He saw his primary doctor who would and put him on doxycycline for 10 days. Past medical history type 2 diabetes with peripheral neuropathy, hyperlipidemia and hypertension. ABI in our clinic was 1.17 on the right 02/11/20-Patient back at 2 weeks, right great toe plantar wound is about the same with slight callus build up. 8/27; 2-week follow-up right. Aggressively debrided last time considerable callus buildup. We have been using silver collagen. This is definitely going to require a total contact cast however a week today his daughter gets married we elected to delay that until the Monday after that I E 10 days from now. 9/7; right plantar toe. Aggressively debrided last time and the wound actually looks some better with less depth. 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 In view of this I did not put a total contact cast on. Also noted he is having marked indentation of his right great toe in the surgical shoe he is wearing 9/14; right plantar great toe. Surface of the wound looks healthy again he has overriding skin. He is leaving for [place] on vacation on 9/24, after this I think he will require a total contact cast. We started him off on silver collagen for some reason he changed to silver alginate I am going to change him back to silver collagen today 10/5; right plantar great toe. He is returned from [place] he was not offloading this at all I am not exactly sure whether he was even dressing this. Arrives with extensive undermining from the wound on the plantar aspect of the right great toe. He has a surgical shoe but nothing else no felt etc. 10/12; right plantar great toe. The wound does not look unhealthy however its has  thick edges overhanging skin. He is ready for a total contact cast. Still using silver collagen as the primary dressing 10/15; the patient is here for the obligatory first total contact cast change. This was done in the standard fashion the wound was not examined 10/19 wound bed looks smaller but still some depth. Healthy granulation. Using silver collagen under a total contact  cast 10/26; small wound minimal depth no undermining or using silver collagen under a total contact cast 11/2; after debridement today of callus subcutaneous tissue from the wound margin there is only a very tiny open area remaining. Continue silver collagen under total contact cast should be closed by next week 11/9; still thick callus around the wound margin and this is still open perhaps slightly larger than last week. Cast is not in good shape when he arrives back in the clinic. We have been using silver collagen as the primary dressing I changed to Hydrofera Blue Objective Constitutional Patient is hypertensive.. Pulse regular and within target range for patient.Marland Kitchen. Respirations regular, non-labored and within target range.. Temperature is normal and within the target range for the patient.Marland Kitchen. Appears in no distress. Vitals Time Taken: 8:35 AM, Height: 70 in, Source: Stated, Weight: 222 lbs, Source: Stated, BMI: 31.9, Temperature: 98.1 F, Pulse: 71 bpm, Respiratory Rate: 18 breaths/min, Blood Pressure: 185/99 mmHg, Capillary Blood Glucose: 160 mg/dl. General Notes: glucose per pt report this am Cardiovascular No pulses are palpable. General Notes: Wound exam; right plantar great toe. Again thick callus around the wound margins dry flaking skin. The wound actually measures larger. I do not know that we are supporting the toe enough Integumentary (Hair, Skin) Wound #4 status is Open. Original cause of wound was Blister. The wound is located on the Right T Great. The wound measures 0.3cm length x 0.3cm width oe x 0.1cm  depth; 0.071cm^2 area and 0.007cm^3 volume. There is Fat Layer (Subcutaneous Tissue) exposed. There is no tunneling or undermining noted. There is a small amount of serosanguineous drainage noted. The wound margin is well defined and not attached to the wound base. There is large (67-100%) red granulation within the wound bed. There is no necrotic tissue within the wound bed. Assessment Active Problems ICD-10 Type 2 diabetes mellitus with foot ulcer Type 2 diabetes mellitus with diabetic polyneuropathy Non-pressure chronic ulcer of other part of right foot with fat layer exposed Procedures Wound #4 Pre-procedure diagnosis of Wound #4 is a Diabetic Wound/Ulcer of the Lower Extremity located on the Right T Great .Severity of Tissue Pre Debridement is: oe Fat layer exposed. There was a Selective/Open Wound Skin/Epidermis Debridement with a total area of 0.09 sq cm performed by Maxwell Caulobson, Najai Waszak G., MD. With the following instrument(s): Curette to remove Viable and Non-Viable tissue/material. Material removed includes Callus, Skin: Dermis, and Skin: Epidermis. No specimens were taken. A time out was conducted at 08:51, prior to the start of the procedure. A Moderate amount of bleeding was controlled with Silver Nitrate. The procedure was tolerated well with a pain level of 0 throughout and a pain level of 0 following the procedure. Post Debridement Measurements: 0.3cm length x 0.3cm width x 0.1cm depth; 0.007cm^3 volume. Character of Wound/Ulcer Post Debridement is improved. Severity of Tissue Post Debridement is: Fat layer exposed. Post procedure Diagnosis Wound #4: Same as Pre-Procedure Pre-procedure diagnosis of Wound #4 is a Diabetic Wound/Ulcer of the Lower Extremity located on the Right T Great . There was a T Contact Cast oe otal Procedure by Maxwell Caulobson, Anokhi Shannon G., MD. Post procedure Diagnosis Wound #4: Same as Pre-Procedure Plan Follow-up Appointments: Return Appointment in 1 week. -  tuesday Dressing Change Frequency: Do not change entire dressing for one week. Wound Cleansing: May shower with protection. Primary Wound Dressing: Wound #4 Right T Great: oe Hydrofera Blue Secondary Dressing: Wound #4 Right T Great: oe Foam - foam donut Dry Gauze Other: - felt  over foam donut for padding Off-Loading: Wound #4 Right T Great: oe T Contact Cast to Right Lower Extremity otal #1 change the primary dressing in the right great toe to [facility] felt with a foam doughnut 2. Judging by the look of his cast this was an active week and I have talked to him about this or more appropriately he talk to me about this. I told him to cut down on what he can 3. Replacement of the total contact cast Electronic Signature(s) Signed: 05/10/2020 9:36:19 AM By: Baltazar Najjar MD Entered By: Baltazar Najjar on 05/09/2020 09:03:52 -------------------------------------------------------------------------------- Total Contact Cast Details Patient Name: Date of Service: [place], [name] 05/09/2020 8:30 A M Medical Record Number: [id number] Patient Account Number: [id number] Date of Birth/Sex: Treating RN: 03-03-1956 (64 y.o. [name]) [name] Primary Care Provider: Cynda Familia Other Clinician: Referring Provider: Treating Provider/Extender: Willia Craze in Treatment: 15 T Contact Cast Applied for Wound Assessment: otal Wound #4 Right T Great oe Performed By: Physician Maxwell Caul., MD Post Procedure Diagnosis Same as Pre-procedure Electronic Signature(s) Signed: 05/10/2020 9:36:19 AM By: Baltazar Najjar MD Signed: 05/10/2020 5:51:14 PM By: [name] RN Entered By: [name] on 05/09/2020 08:59:21 -------------------------------------------------------------------------------- SuperBill Details Patient Name: Date of Service: [place], [name] 05/09/2020 Medical Record Number: [id number] Patient Account Number: [id number] Date of  Birth/Sex: Treating RN: 1955/11/22 (64 y.o. 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

## 2020-05-10 NOTE — Progress Notes (Signed)
LORENE, SAMAAN ([id number]) Visit Report for 05/09/2020 Arrival Information Details Patient Name: Date of Service: [place], [place] 05/09/2020 8:30 A M Medical Record Number: [id number] Patient Account Number: [id number] Date of Birth/Sex: Treating RN: December 01, 1955 (64 y.o. Ernestene Mention Primary Care Garnett Nunziata: Howie Ill Other Clinician: Referring Kimm Sider: Treating Katherine Tout/Extender: Aldine Contes in Treatment: 15 Visit Information History Since Last Visit Added or deleted any medications: No Patient Arrived: Ambulatory Any new allergies or adverse reactions: No Arrival Time: 08:34 Had a fall or experienced change in No Accompanied By: self activities of daily living that may affect Transfer Assistance: None risk of falls: Patient Identification Verified: Yes Signs or symptoms of abuse/neglect since last visito No Secondary Verification Process Completed: Yes Hospitalized since last visit: No Patient Requires Transmission-Based Precautions: No Implantable device outside of the clinic excluding No Patient Has Alerts: No cellular tissue based products placed in the center since last visit: Has Dressing in Place as Prescribed: Yes Has Footwear/Offloading in Place as Prescribed: Yes Right: T Contact Cast otal Pain Present Now: No Electronic Signature(s) Signed: 05/10/2020 5:57:36 PM By: Baruch Gouty RN, BSN Entered By: Baruch Gouty on 05/09/2020 08:35:09 -------------------------------------------------------------------------------- Encounter Discharge Information Details Patient Name: Date of Service: [place], [place] 05/09/2020 8:30 A M Medical Record Number: [id number] Patient Account Number: [id number] Date of Birth/Sex: Treating RN: 1955/09/19 (64 y.o. Hessie Diener Primary Care Diar Berkel: Howie Ill Other Clinician: Referring Ilia Dimaano: Treating Samer Dutton/Extender: Aldine Contes in Treatment:  15 Encounter Discharge Information Items Post Procedure Vitals Discharge Condition: Stable Temperature (F): 98.1 Ambulatory Status: Ambulatory Pulse (bpm): 71 Discharge Destination: Home Respiratory Rate (breaths/min): 18 Transportation: Private Auto Blood Pressure (mmHg): 185/99 Accompanied By: self Schedule Follow-up Appointment: Yes Clinical Summary of Care: Electronic Signature(s) Signed: 05/09/2020 5:43:21 PM By: Deon Pilling Entered By: Deon Pilling on 05/09/2020 09:26:20 -------------------------------------------------------------------------------- Lower Extremity Assessment Details Patient Name: Date of Service: [place], [place] 05/09/2020 8:30 A M Medical Record Number: [id number] Patient Account Number: [id number] Date of Birth/Sex: Treating RN: 1955-07-25 (64 y.o. Ernestene Mention Primary Care Karrington Mccravy: Howie Ill Other Clinician: Referring Mellina Benison: Treating Quanika Solem/Extender: Aldine Contes in Treatment: 15 Edema Assessment Assessed: Shirlyn Goltz: No] Patrice Paradise: No] Edema: [Left: N] [Right: o] Calf Left: Right: Point of Measurement: From Medial Instep 37.8 cm Ankle Left: Right: Point of Measurement: From Medial Instep 23 cm Vascular Assessment Pulses: Dorsalis Pedis Palpable: [Right:Yes] Electronic Signature(s) Signed: 05/10/2020 5:57:36 PM By: Baruch Gouty RN, BSN Entered By: Baruch Gouty on 05/09/2020 08:40:46 -------------------------------------------------------------------------------- Multi Wound Chart Details Patient Name: Date of Service: [place], [place] 05/09/2020 8:30 A M Medical Record Number: [id number] Patient Account Number: [id number] Date of Birth/Sex: Treating RN: [birth year] (64 y.o. Jerilynn Mages) Carlene Coria Primary Care Myesha Stillion: Howie Ill Other Clinician: Referring Kenidy Crossland: Treating Khiley Lieser/Extender: Aldine Contes in Treatment: 15 Vital Signs Height(in): 70 Capillary  Blood Glucose(mg/dl): 160 Weight(lbs): 222 Pulse(bpm): 64 Body Mass Index(BMI): 32 Blood Pressure(mmHg): 185/99 Temperature(F): 98.1 Respiratory Rate(breaths/min): 18 Photos: [4:No Photos Right T Great oe] [N/A:N/A N/A] Wound Location: [4:Blister] [N/A:N/A] Wounding Event: [4:Diabetic Wound/Ulcer of the Lower] [N/A:N/A] Primary Etiology: [4:Extremity Cataracts, Hypertension, Type II] [N/A:N/A] Comorbid History: [4:Diabetes, Neuropathy, Confinement Anxiety 12/24/2019] [N/A:N/A] Date Acquired: [4:15] [N/A:N/A] Weeks of Treatment: [4:Open] [N/A:N/A] Wound Status: [4:0.3x0.3x0.1] [N/A:N/A] Measurements L x W x D (cm) [4:0.071] [N/A:N/A] A (cm) : rea [4:0.007] [N/A:N/A] Volume (cm) : [4:49.60%] [N/A:N/A] % Reduction in A rea: [4:90.10%] [N/A:N/A] % Reduction in Volume: [  4:Grade 2] [N/A:N/A] Classification: [4:Small] [N/A:N/A] Exudate A mount: [4:Serosanguineous] [N/A:N/A] Exudate Type: [4:red, brown] [N/A:N/A] Exudate Color: [4:Well defined, not attached] [N/A:N/A] Wound Margin: [4:Large (67-100%)] [N/A:N/A] Granulation A mount: [4:Red] [N/A:N/A] Granulation Quality: [4:None Present (0%)] [N/A:N/A] Necrotic A mount: [4:Fat Layer (Subcutaneous Tissue): Yes N/A] Exposed Structures: [4:Fascia: No Tendon: No Muscle: No Joint: No Bone: No Small (1-33%)] [N/A:N/A] Epithelialization: [4:Debridement - Selective/Open Wound N/A] Debridement: Pre-procedure Verification/Time Out 08:51 [N/A:N/A] Taken: [4:Callus] [N/A:N/A] Tissue Debrided: [4:Skin/Epidermis] [N/A:N/A] Level: [4:0.09] [N/A:N/A] Debridement A (sq cm): [4:rea Curette] [N/A:N/A] Instrument: [4:Moderate] [N/A:N/A] Bleeding: [4:Silver Nitrate] [N/A:N/A] Hemostasis A chieved: [4:0] [N/A:N/A] Procedural Pain: [4:0] [N/A:N/A] Post Procedural Pain: [4:Procedure was tolerated well] [N/A:N/A] Debridement Treatment Response: [4:0.3x0.3x0.1] [N/A:N/A] Post Debridement Measurements L x W x D (cm) [4:0.007] [N/A:N/A] Post Debridement  Volume: (cm) [4:Debridement] [N/A:N/A] Procedures Performed: [4:T Contact Cast otal] Treatment Notes Electronic Signature(s) Signed: 05/10/2020 9:36:19 AM By: Linton Ham MD Signed: 05/10/2020 5:51:14 PM By: Carlene Coria RN Entered By: Linton Ham on 05/09/2020 09:00:28 -------------------------------------------------------------------------------- Multi-Disciplinary Care Plan Details Patient Name: Date of Service: [place], [place] 05/09/2020 8:30 A M Medical Record Number: [id number] Patient Account Number: [id number] Date of Birth/Sex: Treating RN: [birth year] (64 y.o. Oval Linsey Primary Care Tyne Banta: Howie Ill Other Clinician: Referring Pearle Wandler: Treating Kanyia Heaslip/Extender: Aldine Contes in Treatment: 15 Active Inactive Wound/Skin Impairment Nursing Diagnoses: Knowledge deficit related to ulceration/compromised skin integrity Goals: Patient/caregiver will verbalize understanding of skin care regimen Date Initiated: 01/25/2020 Target Resolution Date: 05/18/2020 Goal Status: Active Ulcer/skin breakdown will have a volume reduction of 30% by week 4 Date Initiated: 01/25/2020 Date Inactivated: 04/04/2020 Target Resolution Date: 03/17/2020 Goal Status: Met Ulcer/skin breakdown will have a volume reduction of 50% by week 8 Date Initiated: 04/04/2020 Date Inactivated: 04/11/2020 Target Resolution Date: 04/17/2020 Goal Status: Met Ulcer/skin breakdown will have a volume reduction of 80% by week 12 Date Initiated: 04/11/2020 Target Resolution Date: 05/18/2020 Goal Status: Active Interventions: Assess patient/caregiver ability to obtain necessary supplies Assess patient/caregiver ability to perform ulcer/skin care regimen upon admission and as needed Assess ulceration(s) every visit Notes: Electronic Signature(s) Signed: 05/10/2020 5:51:14 PM By: Carlene Coria RN Entered By: Carlene Coria on 05/09/2020  08:37:17 -------------------------------------------------------------------------------- Pain Assessment Details Patient Name: Date of Service: [place], [place] 05/09/2020 8:30 A M Medical Record Number: [id number] Patient Account Number: [id number] Date of Birth/Sex: Treating RN: 1956/05/15 (64 y.o. Ernestene Mention Primary Care Zeb Rawl: Howie Ill Other Clinician: Referring Halleigh Comes: Treating Leeyah Heather/Extender: Aldine Contes in Treatment: 15 Active Problems Location of Pain Severity and Description of Pain Patient Has Paino No Site Locations Rate the pain. Current Pain Level: 0 Pain Management and Medication Current Pain Management: Electronic Signature(s) Signed: 05/10/2020 5:57:36 PM By: Baruch Gouty RN, BSN Entered By: Baruch Gouty on 05/09/2020 08:36:38 -------------------------------------------------------------------------------- Patient/Caregiver Education Details Patient Name: Date of Service: [place] 11/9/2021andnbsp8:30 A M Medical Record Number: [id number] Patient Account Number: [id number] Date of Birth/Gender: Treating RN: [birth year] (64 y.o. Oval Linsey Primary Care Physician: Howie Ill Other Clinician: Referring Physician: Treating Physician/Extender: Aldine Contes in Treatment: 15 Education Assessment Education Provided To: Patient Education Topics Provided Wound/Skin Impairment: Methods: Explain/Verbal Responses: State content correctly Electronic Signature(s) Signed: 05/10/2020 5:51:14 PM By: Carlene Coria RN Entered By: Carlene Coria on 05/09/2020 08:37:33 -------------------------------------------------------------------------------- Wound Assessment Details Patient Name: Date of Service: [place], [place] 05/09/2020 8:30 A M Medical Record Number: [id number] Patient Account Number: [id number] Date of Birth/Sex: Treating RN: June 28, 1956 (  64 y.o. Ernestene Mention Primary Care Ltanya Bayley: Howie Ill Other Clinician: Referring Coner Gibbard: Treating Carney Saxton/Extender: Aldine Contes in Treatment: 15 Wound Status Wound Number: 4 Primary Diabetic Wound/Ulcer of the Lower Extremity Etiology: Wound Location: Right T Great oe Wound Status: Open Wounding Event: Blister Comorbid Cataracts, Hypertension, Type II Diabetes, Neuropathy, Date Acquired: 12/24/2019 History: Confinement Anxiety Weeks Of Treatment: 15 Clustered Wound: No Wound Measurements Length: (cm) 0.3 Width: (cm) 0.3 Depth: (cm) 0.1 Area: (cm) 0.071 Volume: (cm) 0.007 % Reduction in Area: 49.6% % Reduction in Volume: 90.1% Epithelialization: Small (1-33%) Tunneling: No Undermining: No Wound Description Classification: Grade 2 Wound Margin: Well defined, not attached Exudate Amount: Small Exudate Type: Serosanguineous Exudate Color: red, brown Foul Odor After Cleansing: No Slough/Fibrino No Wound Bed Granulation Amount: Large (67-100%) Exposed Structure Granulation Quality: Red Fascia Exposed: No Necrotic Amount: None Present (0%) Fat Layer (Subcutaneous Tissue) Exposed: Yes Tendon Exposed: No Muscle Exposed: No Joint Exposed: No Bone Exposed: No Treatment Notes Wound #4 (Right Toe Great) 1. Cleanse With Wound Cleanser 3. Primary Dressing Applied Hydrofera Blue 4. Secondary Dressing Dry Gauze Foam 5. Secured With Medipore tape 7. Footwear/Offloading device applied Felt/Foam Notes foam donut as secondary. last layer of TCC applied by MD. Electronic Signature(s) Signed: 05/10/2020 5:57:36 PM By: Baruch Gouty RN, BSN Entered By: Baruch Gouty on 05/09/2020 08:43:00 -------------------------------------------------------------------------------- [place] Details Patient Name: Date of Service: [place], [place] 05/09/2020 8:30 A M Medical Record Number: [id number] Patient Account Number: [id number] Date of  Birth/Sex: Treating RN: 1956-02-13 (64 y.o. Ernestene Mention Primary Care Vangie Henthorn: Howie Ill Other Clinician: Referring Sherley Leser: Treating Velvet Moomaw/Extender: Aldine Contes in Treatment: 15 Vital Signs Time Taken: 08:35 Temperature (F): 98.1 Height (in): 70 Pulse (bpm): 71 Source: Stated Respiratory Rate (breaths/min): 18 Weight (lbs): 222 Blood Pressure (mmHg): 185/99 Source: Stated Capillary Blood Glucose (mg/dl): 160 Body Mass Index (BMI): 31.9 Reference Range: 80 - 120 mg / dl Notes glucose per pt report this am Electronic Signature(s) Signed: 05/10/2020 5:57:36 PM By: Baruch Gouty RN, BSN Entered By: Baruch Gouty on 05/09/2020 44:69:50

## 2020-05-16 ENCOUNTER — Other Ambulatory Visit: Payer: Self-pay

## 2020-05-16 ENCOUNTER — Encounter (HOSPITAL_BASED_OUTPATIENT_CLINIC_OR_DEPARTMENT_OTHER): Payer: 59 | Admitting: Internal Medicine

## 2020-05-16 DIAGNOSIS — E11621 Type 2 diabetes mellitus with foot ulcer: Secondary | ICD-10-CM | POA: Diagnosis not present

## 2020-05-16 NOTE — Progress Notes (Signed)
MAXIMIANO, LOTT ([id number]) Visit Report for 05/16/2020 Arrival Information Details Patient Name: Date of Service: [place], [place] 05/16/2020 8:00 A M Medical Record Number: [id number] Patient Account Number: [id number] Date of Birth/Sex: Treating RN: [birth year] (64 y.o. Hessie Diener Primary Care Jerene Yeager: Howie Ill Other Clinician: Referring Evamaria Detore: Treating Jomar Denz/Extender: Aldine Contes in Treatment: 16 Visit Information History Since Last Visit Added or deleted any medications: No Patient Arrived: Ambulatory Any new allergies or adverse reactions: No Arrival Time: 08:10 Had a fall or experienced change in No Accompanied By: self activities of daily living that may affect Transfer Assistance: None risk of falls: Patient Identification Verified: Yes Signs or symptoms of abuse/neglect since last visito No Secondary Verification Process Completed: Yes Hospitalized since last visit: No Patient Requires Transmission-Based Precautions: No Implantable device outside of the clinic excluding No Patient Has Alerts: No cellular tissue based products placed in the center since last visit: Has Dressing in Place as Prescribed: Yes Has Footwear/Offloading in Place as Prescribed: Yes Right: T Contact Cast otal Pain Present Now: No Electronic Signature(s) Signed: 05/16/2020 12:08:35 PM By: Deon Pilling Entered By: Deon Pilling on 05/16/2020 08:12:31 -------------------------------------------------------------------------------- Encounter Discharge Information Details Patient Name: Date of Service: Metro Kung, [place] 05/16/2020 8:00 [place] Record Number: [id number] Patient Account Number: [id number] Date of Birth/Sex: Treating RN: 10-Jun-1956 (64 y.o. Hessie Diener Primary Care Nataniel Gasper: Howie Ill Other Clinician: Referring Evadne Ose: Treating Monea Pesantez/Extender: Aldine Contes in Treatment: 16 Encounter  Discharge Information Items Discharge Condition: Stable Ambulatory Status: Ambulatory Discharge Destination: Home Transportation: Private Auto Accompanied By: self Schedule Follow-up Appointment: Yes Clinical Summary of Care: Electronic Signature(s) Signed: 05/16/2020 12:08:35 PM By: Deon Pilling Entered By: Deon Pilling on 05/16/2020 09:22:05 -------------------------------------------------------------------------------- Lower Extremity Assessment Details Patient Name: Date of Service: [place], [place] 05/16/2020 8:00 [place] Record Number: [id number] Patient Account Number: [id number] Date of Birth/Sex: Treating RN: 03/06/1956 (64 y.o. Ernestene Mention Primary Care Aubrey Blackard: Howie Ill Other Clinician: Referring Torence Palmeri: Treating Gustava Berland/Extender: Aldine Contes in Treatment: 16 Edema Assessment Assessed: Shirlyn Goltz: No] Patrice Paradise: No] Edema: [Left: N] [Right: o] Calf Left: Right: Point of Measurement: From Medial Instep 37.8 cm Ankle Left: Right: Point of Measurement: From Medial Instep 23 cm Vascular Assessment Pulses: Dorsalis Pedis Palpable: [Right:Yes] Electronic Signature(s) Signed: 05/16/2020 5:21:16 PM By: Baruch Gouty RN, BSN Entered By: Baruch Gouty on 05/16/2020 08:47:11 -------------------------------------------------------------------------------- Multi Wound Chart Details Patient Name: Date of Service: Metro Kung, [place] 05/16/2020 8:00 A M Medical Record Number: [id number] Patient Account Number: [id number] Date of Birth/Sex: Treating RN: Feb 29, 1956 (64 y.o. Jerilynn Mages) Carlene Coria Primary Care Andrea Colglazier: Howie Ill Other Clinician: Referring Darsh Vandevoort: Treating Yarah Fuente/Extender: Aldine Contes in Treatment: 16 Vital Signs Height(in): 70 Capillary Blood Glucose(mg/dl): 162 Weight(lbs): 222 Pulse(bpm): 68 Body Mass Index(BMI): 32 Blood Pressure(mmHg): 161/92 Temperature(F):  98 Respiratory Rate(breaths/min): 18 Photos: [4:No Photos Right T Great oe] [N/A:N/A N/A] Wound Location: [4:Blister] [N/A:N/A] Wounding Event: [4:Diabetic Wound/Ulcer of the Lower] [N/A:N/A] Primary Etiology: [4:Extremity Cataracts, Hypertension, Type II] [N/A:N/A] Comorbid History: [4:Diabetes, Neuropathy, Confinement Anxiety 12/24/2019] [N/A:N/A] Date Acquired: [4:16] [N/A:N/A] Weeks of Treatment: [4:Open] [N/A:N/A] Wound Status: [4:0.1x0.1x0.1] [N/A:N/A] Measurements L x W x D (cm) [4:0.008] [N/A:N/A] A (cm) : rea [4:0.001] [N/A:N/A] Volume (cm) : [4:94.30%] [N/A:N/A] % Reduction in A rea: [4:98.60%] [N/A:N/A] % Reduction in Volume: [4:Grade 2] [N/A:N/A] Classification: [4:None Present] [N/A:N/A] Exudate A mount: [4:Well defined, not attached] [N/A:N/A] Wound Margin: [4:None Present (  0%)] [N/A:N/A] Granulation A mount: [4:None Present (0%)] [N/A:N/A] Necrotic A mount: [4:Fascia: No] [N/A:N/A] Exposed Structures: [4:Fat Layer (Subcutaneous Tissue): No Tendon: No Muscle: No Joint: No Bone: No Large (67-100%)] [N/A:N/A] Epithelialization: [4:T Contact Cast otal] [N/A:N/A] Treatment Notes Wound #4 (Right Toe Great) 1. Cleanse With Wound Cleanser Soap and water 2. Periwound Care Skin Prep 3. Primary Dressing Applied Hydrofera Blue 4. Secondary Dressing Dry Gauze Roll Gauze Foam Other secondary dressing (specify in notes) 5. Secured With Medipore tape 7. Footwear/Offloading device applied Felt/Foam Notes foam donut as secondary. last layer of TCC applied by MD. Electronic Signature(s) Signed: 05/16/2020 4:54:15 PM By: Linton Ham MD Signed: 05/16/2020 5:00:30 PM By: Carlene Coria RN Entered By: Linton Ham on 05/16/2020 09:30:46 -------------------------------------------------------------------------------- Multi-Disciplinary Care Plan Details Patient Name: Date of Service: [place], [place] 05/16/2020 8:00 A M Medical Record Number: [id number] Patient  Account Number: [id number] Date of Birth/Sex: Treating RN: [birth year] (64 y.o. Oval Linsey Primary Care Jenisa Monty: Howie Ill Other Clinician: Referring Britney Newstrom: Treating Emidio Warrell/Extender: Aldine Contes in Treatment: 16 Active Inactive Wound/Skin Impairment Nursing Diagnoses: Knowledge deficit related to ulceration/compromised skin integrity Goals: Patient/caregiver will verbalize understanding of skin care regimen Date Initiated: 01/25/2020 Target Resolution Date: 05/18/2020 Goal Status: Active Ulcer/skin breakdown will have a volume reduction of 30% by week 4 Date Initiated: 01/25/2020 Date Inactivated: 04/04/2020 Target Resolution Date: 03/17/2020 Goal Status: Met Ulcer/skin breakdown will have a volume reduction of 50% by week 8 Date Initiated: 04/04/2020 Date Inactivated: 04/11/2020 Target Resolution Date: 04/17/2020 Goal Status: Met Ulcer/skin breakdown will have a volume reduction of 80% by week 12 Date Initiated: 04/11/2020 Target Resolution Date: 05/18/2020 Goal Status: Active Interventions: Assess patient/caregiver ability to obtain necessary supplies Assess patient/caregiver ability to perform ulcer/skin care regimen upon admission and as needed Assess ulceration(s) every visit Notes: Electronic Signature(s) Signed: 05/16/2020 5:00:30 PM By: Carlene Coria RN Entered By: Carlene Coria on 05/16/2020 08:05:23 -------------------------------------------------------------------------------- Pain Assessment Details Patient Name: Date of Service: Metro Kung, [place] 05/16/2020 8:00 [place] Record Number: [id number] Patient Account Number: [id number] Date of Birth/Sex: Treating RN: 03/02/1956 (64 y.o. Hessie Diener Primary Care Elizebeth Kluesner: Howie Ill Other Clinician: Referring Bader Stubblefield: Treating Nyeem Stoke/Extender: Aldine Contes in Treatment: 16 Active Problems Location of Pain Severity and  Description of Pain Patient Has Paino No Site Locations Rate the pain. Current Pain Level: 0 Pain Management and Medication Current Pain Management: Medication: No Cold Application: No Rest: No Massage: No Activity: No T.E.N.S.: No Heat Application: No Leg drop or elevation: No Is the Current Pain Management Adequate: Adequate How does your wound impact your activities of daily livingo Sleep: No Bathing: No Appetite: No Relationship With Others: No Bladder Continence: No Emotions: No Bowel Continence: No Work: No Toileting: No Drive: No Dressing: No Hobbies: No Electronic Signature(s) Signed: 05/16/2020 12:08:35 PM By: Deon Pilling Entered By: Deon Pilling on 05/16/2020 08:12:53 -------------------------------------------------------------------------------- Patient/Caregiver Education Details Patient Name: Date of Service: [place], [place] 11/16/2021andnbsp8:00 [place] Record Number: [id number] Patient Account Number: [id number] Date of Birth/Gender: Treating RN: 08/06/1955 (64 y.o. Oval Linsey Primary Care Physician: Howie Ill Other Clinician: Referring Physician: Treating Physician/Extender: Aldine Contes in Treatment: 16 Education Assessment Education Provided To: Patient Education Topics Provided Wound/Skin Impairment: Methods: Explain/Verbal Responses: State content correctly Electronic Signature(s) Signed: 05/16/2020 5:00:30 PM By: Carlene Coria RN Entered By: Carlene Coria on 05/16/2020 08:05:39 -------------------------------------------------------------------------------- Wound Assessment Details Patient Name: Date of Service: [place], [place] 05/16/2020 8:00 A M Medical Record Number: [id number] Patient Account Number: [id number] Date of Birth/Sex: Treating RN: 21-Aug-1955 (63 y.o. Ernestene Mention Primary Care Kemisha Bonnette: Howie Ill Other Clinician: Referring Nezzie Manera: Treating Ayn Domangue/Extender:  Aldine Contes in Treatment: 16 Wound Status Wound Number: 4 Primary Diabetic Wound/Ulcer of the Lower Extremity Etiology: Wound Location: Right T Great oe Wound Status: Open Wounding Event: Blister Comorbid Cataracts, Hypertension, Type II Diabetes, Neuropathy, Date Acquired: 12/24/2019 History: Confinement Anxiety Weeks Of Treatment: 16 Clustered Wound: No Wound Measurements Length: (cm) 0.1 Width: (cm) 0.1 Depth: (cm) 0.1 Area: (cm) 0.008 Volume: (cm) 0.001 % Reduction in Area: 94.3% % Reduction in Volume: 98.6% Epithelialization: Large (67-100%) Tunneling: No Undermining: No Wound Description Classification: Grade 2 Wound Margin: Well defined, not attached Exudate Amount: None Present Foul Odor After Cleansing: No Slough/Fibrino No Wound Bed Granulation Amount: None Present (0%) Exposed Structure Necrotic Amount: None Present (0%) Fascia Exposed: No Fat Layer (Subcutaneous Tissue) Exposed: No Tendon Exposed: No Muscle Exposed: No Joint Exposed: No Bone Exposed: No Treatment Notes Wound #4 (Right Toe Great) 1. Cleanse With Wound Cleanser Soap and water 2. Periwound Care Skin Prep 3. Primary Dressing Applied Hydrofera Blue 4. Secondary Dressing Dry Gauze Roll Gauze Foam Other secondary dressing (specify in notes) 5. Secured With Medipore tape 7. Footwear/Offloading device applied Felt/Foam Notes foam donut as secondary. last layer of TCC applied by MD. Electronic Signature(s) Signed: 05/16/2020 5:21:16 PM By: Baruch Gouty RN, BSN Entered By: Baruch Gouty on 05/16/2020 08:47:38 -------------------------------------------------------------------------------- [place] Details Patient Name: Date of Service: Metro Kung, [place] 05/16/2020 8:00 [place] Record Number: [id number] Patient Account Number: [id number] Date of Birth/Sex: Treating RN: 16-Feb-1956 (64 y.o. Lorette Ang, [place] Primary Care Elene Downum: Howie Ill Other Clinician: Referring Ezra Marquess: Treating Evelina Lore/Extender: Aldine Contes in Treatment: 16 Vital Signs Time Taken: 08:10 Temperature (F): 98 Height (in): 70 Pulse (bpm): 76 Weight (lbs): 222 Respiratory Rate (breaths/min): 18 Body Mass Index (BMI): 31.9 Blood Pressure (mmHg): 161/92 Capillary Blood Glucose (mg/dl): 162 Reference Range: 80 - 120 mg / dl Electronic Signature(s) Signed: 05/16/2020 5:21:16 PM By: Baruch Gouty RN, BSN Entered By: Baruch Gouty on 05/16/2020 08:51:36

## 2020-05-16 NOTE — Progress Notes (Signed)
[name], [name] ([id number]) Visit Report for 05/16/2020 HPI Details Patient Name: Date of Service: [place], [name] 05/16/2020 8:00 A M Medical Record Number: [id number] Patient Account Number: [id number] Date of Birth/Sex: Treating RN: 1956-03-25 (64 y.o. [name]) [name] Primary Care Provider: Cynda Familia Other Clinician: Referring Provider: Treating Provider/Extender: Willia Craze in Treatment: 16 History of Present Illness HPI Description: 12/16/18 on evaluation today patient presents for initial evaluation our clinic as a self-referral due to a wound that is had on his right first toe for 6-8 weeks. He states his last hemoglobin A1c which was roughly 4 months ago was 11.0 he knows he has not been taking care of himself in this regard. He's actually been out of medications for two weeks both in regard to his diabetes as well as his high blood pressure. He states he is going to see those doctors in the next week. Subsequently he actually told me that being here in the clinic and seeing so many patients that did have applications in such has really got him thinking about the fact that he needs to take better care of himself in this regard. Fortunately there's no signs of active infection at this time which is good news. No fevers, chills, nausea, or vomiting noted at this time. He has been seen a local podiatrist for the past 6-8 weeks he was recommending for the patient that he soak in Epsom salts and come in on the patient tells me generally Tuesday and Thursday for debridement's and otherwise was told to leave the wound open to air just put a sock on and go about his business and that "it would scab up and be okay". With that being said the patient states when he realized things were not getting better and he really did not agree with the treatment plan he decided to get a second opinion here the wound care center. Apparently he's had x-rays although the patient's  podiatrist has not sent [place] records at this point I think we may want to send them to the hospital for an x-ray just to have everything documented before we see about what we need to do from the standpoint of more aggressive treatment such as potentially casting all over this be his right foot that's gonna be somewhat difficult in my pinion. 12/23/18 on evaluation today patient actually appears to be doing okay in regard to his plantar foot ulcer. Unfortunately he was not able to go get the x-ray during the last visit here in the office. Subsequently he states that he actually forgot to go do that in the interim between last week and this week. That is something is gonna go do as soon as he leaves here today. We discussed again that I do think the total contact cast would be the appropriate way to go in the best way to get the news to heal. With that being said beginning the x-ray clear before we proceed with the cast he understands. 6/26; patient here for obligatory total contact cast change no complaints 12/30/18 on evaluation today patient actually appears to be doing excellent in regard to his plantar foot wound. This is looking tremendously better there's new callous buildup it's not as deep and overall show signs of excellent improvement. I'm very pleased in this regard. Overall I think that he should continue with the Current wound care measures including the total contact cast as I believe this is gonna be most helpful in getting this to  heal very quickly. 01/06/19 on evaluation today patient actually appears to be doing quite well with regard to his toe ulcer. This is shown signs of good improvement in fact is measuring smaller by about half of what it was last week. Nonetheless I think this is excellent improvement is filled in an overall is making great progress. No fevers, chills, nausea, or vomiting noted at this time. 7/16; patient is here for review of a wound on the plantar aspect of his  right great toe. Been using silver collagen under a total contact cast 01/20/19 on evaluation today patient actually presents for a slightly early follow-up due to the fact that his cast has been rubbing on the right medial ankle region. Unfortunately he has a new wound at this location because of the rubbing of the cast. There does not appear to be signs of active infection which is good news. No fevers, chills, nausea, or vomiting noted at this time. Overall been pleased with how things have progressed in regard to his toe again today this appears to be doing well. 02/01/19-Patient returns at 12 days being out of the cast on account of going to the beach, he has been using Prisma to his wounds on his right medial ankle and right great toe plantar aspect. Both appear to be around the same dimensions. 02/08/19-Patient returns at 1 week after being in a cast both the wounds are better in fact the right great plantar wound is healed, right medial ankle wound looks better. We are using Prisma. Does not need to be in cast anymore 8/24; 2-week follow-up. Everything is healed here including the right great plantar toe and the cast injury on the medial ankle. He still has callus on the plantar right great toe. He has gone to [employer] I believe and has custom inserts at least. His wounds have closed over READMISSION 01/25/2020. This is a 64 year old man with poorly controlled type 2 diabetes with a recent hemoglobin A1c of 9.3. He also has diabetic neuropathy. We had him here extensively in 2020 again for a wound on the plantar aspect of the right great toe. Eventually we heal this over and a total contact cast. He got custom inserts for his running shoes at [employer]. He has been using them reliably. He states that roughly 3 to 4 weeks ago he developed a thick callus on the bottom of the toe it eventually separated or split and underneath was an open wound. He saw his primary doctor who would and put him on  doxycycline for 10 days. Past medical history type 2 diabetes with peripheral neuropathy, hyperlipidemia and hypertension. ABI in our clinic was 1.17 on the right 02/11/20-Patient back at 2 weeks, right great toe plantar wound is about the same with slight callus build up. 8/27; 2-week follow-up right. Aggressively debrided last time considerable callus buildup. We have been using silver collagen. This is definitely going to require a total contact cast however a week today his daughter gets married we elected to delay that until the Monday after that I E 10 days from now. 9/7; right plantar toe. Aggressively debrided last time and the wound actually looks some better with less depth. 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 In view of this I did not put a total contact cast on. Also noted he is having marked indentation of his right great toe in the surgical shoe he is wearing  9/14; right plantar great toe. Surface of the wound looks healthy again he has overriding skin. He is leaving for [place] on vacation on 9/24, after this I think he will require a total contact cast. We started him off on silver collagen for some reason he changed to silver alginate I am going to change him back to silver collagen today 10/5; right plantar great toe. He is returned from [place] he was not offloading this at all I am not exactly sure whether he was even dressing this. Arrives with extensive undermining from the wound on the plantar aspect of the right great toe. He has a surgical shoe but nothing else no felt etc. 10/12; right plantar great toe. The wound does not look unhealthy however its has thick edges overhanging skin. He is ready for a total contact cast. Still using silver collagen as the primary dressing 10/15; the patient is here for the obligatory first total contact cast change. This was done in the standard fashion the wound was not  examined 10/19 wound bed looks smaller but still some depth. Healthy granulation. Using silver collagen under a total contact cast 10/26; small wound minimal depth no undermining or using silver collagen under a total contact cast 11/2; after debridement today of callus subcutaneous tissue from the wound margin there is only a very tiny open area remaining. Continue silver collagen under total contact cast should be closed by next week 11/9; still thick callus around the wound margin and this is still open perhaps slightly larger than last week. Cast is not in good shape when he arrives back in the clinic. We have been using silver collagen as the primary dressing I changed to [facility] 11/16; the patient arrives in clinic today with the area fully epithelialized. This is on the plantar right great toe. Part of this still looks vulnerable to me and after discussion we elected to put this back in a total contact cast. Especially in view of the recurrent difficulties we have had in this area. He is a diabetic with peripheral neuropathy. We have been using a total contact cast for several weeks now. According to my notes from about October 12 Electronic Signature(s) Signed: 05/16/2020 4:54:15 PM By: Baltazar Najjar MD Entered By: Baltazar Najjar on 05/16/2020 09:32:41 -------------------------------------------------------------------------------- Physical Exam Details Patient Name: Date of Service: [name], [name] 05/16/2020 8:00 A M Medical Record Number: [id number] Patient Account Number: [id number] Date of Birth/Sex: Treating RN: 1956/06/24 (64 y.o. [name] Primary Care Provider: Cynda Familia Other Clinician: Referring Provider: Treating Provider/Extender: Willia Craze in Treatment: 16 Constitutional Patient is hypertensive.. Pulse regular and within target range for patient.Marland Kitchen Respirations regular, non-labored and within target range.. Temperature  is normal and within the target range for the patient.Marland Kitchen Appears in no distress. Notes Wound exam; right plantar great toe this is fully epithelialized today there is no open area. The epithelialization however does not look fully mature. I gave him the option of follow-up next week with what ever foot where he was going to use on this but I suggested reapplying the total contact cast and he went with that. This should give the area another week to mature and I am thinking we can discharge him next week Electronic Signature(s) Signed: 05/16/2020 4:54:15 PM By: Baltazar Najjar MD Entered By: Baltazar Najjar on 05/16/2020 09:33:57 -------------------------------------------------------------------------------- Physician Orders Details Patient Name: Date of Service: [name], [name] 05/16/2020 8:00 A M Medical Record Number: [id number] Patient Account  Number: [id number] Date of Birth/Sex: Treating RN: 21-Nov-1955 (64 y.o. [name]) [name] Primary Care Provider: Cynda Familia Other Clinician: Referring Provider: Treating Provider/Extender: Willia Craze in Treatment: 16 Verbal / Phone Orders: No Diagnosis Coding ICD-10 Coding Code Description E11.621 Type 2 diabetes mellitus with foot ulcer E11.42 Type 2 diabetes mellitus with diabetic polyneuropathy L97.512 Non-pressure chronic ulcer of other part of right foot with fat layer exposed Follow-up Appointments ppointment in 1 week. - monday or wed Return A Dressing Change Frequency Do not change entire dressing for one week. Wound Cleansing May shower with protection. Primary Wound Dressing Wound #4 Right T Great oe Hydrofera Blue Secondary Dressing Wound #4 Right T Great oe Foam - foam donut Dry Gauze Other: - felt over foam donut for padding Off-Loading Wound #4 Right T Great oe Total Contact Cast to Right Lower Extremity Electronic Signature(s) Signed: 05/16/2020 4:54:15 PM By: Baltazar Najjar  MD Signed: 05/16/2020 5:00:30 PM By: Todd Pax RN Entered By: [name] on 05/16/2020 08:59:05 -------------------------------------------------------------------------------- Problem List Details Patient Name: Date of Service: [place], [name] 05/16/2020 8:00 A M Medical Record Number: [id number] Patient Account Number: [id number] Date of Birth/Sex: Treating RN: 03-Dec-1955 (64 y.o. [name] Primary Care Provider: Cynda Familia Other Clinician: Referring Provider: Treating Provider/Extender: Willia Craze in Treatment: 16 Active Problems ICD-10 Encounter Code Description Active Date MDM Diagnosis E11.621 Type 2 diabetes mellitus with foot ulcer 01/25/2020 No Yes E11.42 Type 2 diabetes mellitus with diabetic polyneuropathy 01/25/2020 No Yes L97.512 Non-pressure chronic ulcer of other part of right foot with fat layer exposed 01/25/2020 No Yes Inactive Problems Resolved Problems Electronic Signature(s) Signed: 05/16/2020 4:54:15 PM By: Baltazar Najjar MD Entered By: Baltazar Najjar on 05/16/2020 09:30:32 -------------------------------------------------------------------------------- Progress Note Details Patient Name: Date of Service: [name], [name] 05/16/2020 8:00 A M Medical Record Number: [id number] Patient Account Number: [id number] Date of Birth/Sex: Treating RN: [birth year] (64 y.o. [name] Primary Care Provider: Cynda Familia Other Clinician: Referring Provider: Treating Provider/Extender: Willia Craze in Treatment: 16 Subjective History of Present Illness (HPI) 12/16/18 on evaluation today patient presents for initial evaluation our clinic as a self-referral due to a wound that is had on his right first toe for 6-8 weeks. He states his last hemoglobin A1c which was roughly 4 months ago was 11.0 he knows he has not been taking care of himself in this regard. He's actually been out of  medications for two weeks both in regard to his diabetes as well as his high blood pressure. He states he is going to see those doctors in the next week. Subsequently he actually told me that being here in the clinic and seeing so many patients that did have applications in such has really got him thinking about the fact that he needs to take better care of himself in this regard. Fortunately there's no signs of active infection at this time which is good news. No fevers, chills, nausea, or vomiting noted at this time. He has been seen a local podiatrist for the past 6-8 weeks he was recommending for the patient that he soak in Epsom salts and come in on the patient tells me generally Tuesday and Thursday for debridement's and otherwise was told to leave the wound open to air just put a sock on and go about his business and that "it would scab up and be okay". With that being said the patient states when he  realized things were not getting better and he really did not agree with the treatment plan he decided to get a second opinion here the wound care center. Apparently he's had x-rays although the patient's podiatrist has not sent [place] records at this point I think we may want to send them to the hospital for an x-ray just to have everything documented before we see about what we need to do from the standpoint of more aggressive treatment such as potentially casting all over this be his right foot that's gonna be somewhat difficult in my pinion. 12/23/18 on evaluation today patient actually appears to be doing okay in regard to his plantar foot ulcer. Unfortunately he was not able to go get the x-ray during the last visit here in the office. Subsequently he states that he actually forgot to go do that in the interim between last week and this week. That is something is gonna go do as soon as he leaves here today. We discussed again that I do think the total contact cast would be the appropriate way to go  in the best way to get the news to heal. With that being said beginning the x-ray clear before we proceed with the cast he understands. 6/26; patient here for obligatory total contact cast change no complaints 12/30/18 on evaluation today patient actually appears to be doing excellent in regard to his plantar foot wound. This is looking tremendously better there's new callous buildup it's not as deep and overall show signs of excellent improvement. I'm very pleased in this regard. Overall I think that he should continue with the Current wound care measures including the total contact cast as I believe this is gonna be most helpful in getting this to heal very quickly. 01/06/19 on evaluation today patient actually appears to be doing quite well with regard to his toe ulcer. This is shown signs of good improvement in fact is measuring smaller by about half of what it was last week. Nonetheless I think this is excellent improvement is filled in an overall is making great progress. No fevers, chills, nausea, or vomiting noted at this time. 7/16; patient is here for review of a wound on the plantar aspect of his right great toe. Been using silver collagen under a total contact cast 01/20/19 on evaluation today patient actually presents for a slightly early follow-up due to the fact that his cast has been rubbing on the right medial ankle region. Unfortunately he has a new wound at this location because of the rubbing of the cast. There does not appear to be signs of active infection which is good news. No fevers, chills, nausea, or vomiting noted at this time. Overall been pleased with how things have progressed in regard to his toe again today this appears to be doing well. 02/01/19-Patient returns at 12 days being out of the cast on account of going to the beach, he has been using Prisma to his wounds on his right medial ankle and right great toe plantar aspect. Both appear to be around the same  dimensions. 02/08/19-Patient returns at 1 week after being in a cast both the wounds are better in fact the right great plantar wound is healed, right medial ankle wound looks better. We are using Prisma. Does not need to be in cast anymore 8/24; 2-week follow-up. Everything is healed here including the right great plantar toe and the cast injury on the medial ankle. He still has callus on the plantar right great  toe. He has gone to [employer] I believe and has custom inserts at least. His wounds have closed over READMISSION 01/25/2020. This is a 64 year old man with poorly controlled type 2 diabetes with a recent hemoglobin A1c of 9.3. He also has diabetic neuropathy. We had him here extensively in 2020 again for a wound on the plantar aspect of the right great toe. Eventually we heal this over and a total contact cast. He got custom inserts for his running shoes at [employer]. He has been using them reliably. He states that roughly 3 to 4 weeks ago he developed a thick callus on the bottom of the toe it eventually separated or split and underneath was an open wound. He saw his primary doctor who would and put him on doxycycline for 10 days. Past medical history type 2 diabetes with peripheral neuropathy, hyperlipidemia and hypertension. ABI in our clinic was 1.17 on the right 02/11/20-Patient back at 2 weeks, right great toe plantar wound is about the same with slight callus build up. 8/27; 2-week follow-up right. Aggressively debrided last time considerable callus buildup. We have been using silver collagen. This is definitely going to require a total contact cast however a week today his daughter gets married we elected to delay that until the Monday after that I E 10 days from now. 9/7; right plantar toe. Aggressively debrided last time and the wound actually looks some better with less depth. 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 In view of this I did not put a total contact cast on. Also noted he is having marked indentation of his right great toe in the surgical shoe he is wearing 9/14; right plantar great toe. Surface of the wound looks healthy again he has overriding skin. He is leaving for [place] on vacation on 9/24, after this I think he will require a total contact cast. We started him off on silver collagen for some reason he changed to silver alginate I am going to change him back to silver collagen today 10/5; right plantar great toe. He is returned from [place] he was not offloading this at all I am not exactly sure whether he was even dressing this. Arrives with extensive undermining from the wound on the plantar aspect of the right great toe. He has a surgical shoe but nothing else no felt etc. 10/12; right plantar great toe. The wound does not look unhealthy however its has thick edges overhanging skin. He is ready for a total contact cast. Still using silver collagen as the primary dressing 10/15; the patient is here for the obligatory first total contact cast change. This was done in the standard fashion the wound was not examined 10/19 wound bed looks smaller but still some depth. Healthy granulation. Using silver collagen under a total contact cast 10/26; small wound minimal depth no undermining or using silver collagen under a total contact cast 11/2; after debridement today of callus subcutaneous tissue from the wound margin there is only a very tiny open area remaining. Continue silver collagen under total contact cast should be closed by next week 11/9; still thick callus around the wound margin and this is still open perhaps slightly larger than last week. Cast is not in good shape when he arrives back in the clinic. We have been using silver collagen as the primary dressing [facility]/16; the patient arrives in clinic today with the  area fully epithelialized. This is on the  plantar right great toe. Part of this still looks vulnerable to me and after discussion we elected to put this back in a total contact cast. Especially in view of the recurrent difficulties we have had in this area. He is a diabetic with peripheral neuropathy. We have been using a total contact cast for several weeks now. According to my notes from about October 12 Objective Constitutional Patient is hypertensive.. Pulse regular and within target range for patient.Marland Kitchen Respirations regular, non-labored and within target range.. Temperature is normal and within the target range for the patient.Marland Kitchen Appears in no distress. Vitals Time Taken: 8:10 AM, Height: 70 in, Weight: 222 lbs, BMI: 31.9, Temperature: 98 F, Pulse: 76 bpm, Respiratory Rate: 18 breaths/min, Blood Pressure: 161/92 mmHg, Capillary Blood Glucose: 162 mg/dl. General Notes: Wound exam; right plantar great toe this is fully epithelialized today there is no open area. The epithelialization however does not look fully mature. I gave him the option of follow-up next week with what ever foot where he was going to use on this but I suggested reapplying the total contact cast and he went with that. This should give the area another week to mature and I am thinking we can discharge him next week Integumentary (Hair, Skin) Wound #4 status is Open. Original cause of wound was Blister. The wound is located on the Right T Great. The wound measures 0.1cm length x 0.1cm width oe x 0.1cm depth; 0.008cm^2 area and 0.001cm^3 volume. There is no tunneling or undermining noted. There is a none present amount of drainage noted. The wound margin is well defined and not attached to the wound base. There is no granulation within the wound bed. There is no necrotic tissue within the wound bed. Assessment Active Problems ICD-10 Type 2 diabetes mellitus with foot ulcer Type 2 diabetes mellitus with diabetic polyneuropathy Non-pressure chronic ulcer of other part  of right foot with fat layer exposed Procedures Wound #4 Pre-procedure diagnosis of Wound #4 is a Diabetic Wound/Ulcer of the Lower Extremity located on the Right T Great . There was a T Contact Cast oe otal Procedure by Maxwell Caul., MD. Post procedure Diagnosis Wound #4: Same as Pre-Procedure Plan Follow-up Appointments: Return Appointment in 1 week. - monday or wed Dressing Change Frequency: Do not change entire dressing for one week. Wound Cleansing: May shower with protection. Primary Wound Dressing: Wound #4 Right T Great: oe Hydrofera Blue Secondary Dressing: Wound #4 Right T Great: oe Foam - foam donut Dry Gauze Other: - felt over foam donut for padding Off-Loading: Wound #4 Right T Great: oe T Contact Cast to Right Lower Extremity otal 1. Hydrofera Blue wound 2. Dry gauze 3. Back in a total contact cast for a further week 4. I told him to bring next week what ever it is he is going to try to offload this area on a continuous basis. I think he is buying a new pair of sandals cutting out the area in the sole and using gauze to pad the area. Electronic Signature(s) Signed: 05/16/2020 4:54:15 PM By: Baltazar Najjar MD Entered By: Baltazar Najjar on 05/16/2020 09:34:46 -------------------------------------------------------------------------------- Total Contact Cast Details Patient Name: Date of Service: [address], [name] 05/16/2020 8:00 A M Medical Record Number: [id number] Patient Account Number: [id number] Date of Birth/Sex: Treating RN: [birth year] (64 y.o. [name] Primary Care Provider: Cynda Familia Other Clinician: Referring Provider: Treating Provider/Extender: Willia Craze in Treatment:  16 T Contact Cast Applied for Wound Assessment: otal Wound #4 Right T Great oe Performed By: Physician Maxwell Caul., MD Post Procedure Diagnosis Same as Pre-procedure Electronic Signature(s) Signed: 05/16/2020 4:54:15 PM  By: Baltazar Najjar MD Entered By: Baltazar Najjar on 05/16/2020 09:30:55 -------------------------------------------------------------------------------- SuperBill Details Patient Name: Date of Service: [place], [name] 05/16/2020 Medical Record Number: [id number] Patient Account Number: [id number] Date of Birth/Sex: Treating RN: 1955-10-11 (64 y.o. 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 Facility Procedures CPT4 Code: [id number] Description: [phone] - APPLY TOTAL CONTACT LEG CAST ICD-10 Diagnosis Description E11.621 Type 2 diabetes mellitus with foot ulcer Modifier: Quantity: 1 Physician Procedures : CPT4 Code Description Modifier [id number] - WC PHYS LEVEL 3 - EST PT ICD-10 Diagnosis Description 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

## 2020-05-22 ENCOUNTER — Encounter (HOSPITAL_BASED_OUTPATIENT_CLINIC_OR_DEPARTMENT_OTHER): Payer: 59 | Admitting: Physician Assistant

## 2021-01-02 ENCOUNTER — Encounter (HOSPITAL_BASED_OUTPATIENT_CLINIC_OR_DEPARTMENT_OTHER): Payer: 59 | Attending: Internal Medicine | Admitting: Internal Medicine

## 2021-01-02 ENCOUNTER — Other Ambulatory Visit: Payer: Self-pay

## 2021-01-02 ENCOUNTER — Other Ambulatory Visit (HOSPITAL_COMMUNITY)
Admission: RE | Admit: 2021-01-02 | Discharge: 2021-01-02 | Disposition: A | Discharge status: Home / Self Care | Payer: 59 | Source: Other Acute Inpatient Hospital | Attending: Internal Medicine | Admitting: Internal Medicine

## 2021-01-02 DIAGNOSIS — L97524 Non-pressure chronic ulcer of other part of left foot with necrosis of bone: Secondary | ICD-10-CM | POA: Insufficient documentation

## 2021-01-02 DIAGNOSIS — I10 Essential (primary) hypertension: Secondary | ICD-10-CM | POA: Diagnosis not present

## 2021-01-02 DIAGNOSIS — E1165 Type 2 diabetes mellitus with hyperglycemia: Secondary | ICD-10-CM | POA: Insufficient documentation

## 2021-01-02 DIAGNOSIS — M86172 Other acute osteomyelitis, left ankle and foot: Secondary | ICD-10-CM | POA: Insufficient documentation

## 2021-01-02 DIAGNOSIS — E785 Hyperlipidemia, unspecified: Secondary | ICD-10-CM | POA: Diagnosis not present

## 2021-01-02 DIAGNOSIS — E1142 Type 2 diabetes mellitus with diabetic polyneuropathy: Secondary | ICD-10-CM | POA: Diagnosis not present

## 2021-01-02 DIAGNOSIS — E11621 Type 2 diabetes mellitus with foot ulcer: Secondary | ICD-10-CM | POA: Insufficient documentation

## 2021-01-02 NOTE — Progress Notes (Signed)
NEILS, SIRACUSA ([id number]) Visit Report for 01/02/2021 Abuse/Suicide Risk Screen Details Patient Name: Date of Service: [name], [place] 01/02/2021 2:45 PM Medical Record Number: [id number] Patient Account Number: [id number] Date of Birth/Sex: Treating RN: 07-30-1955 (66 y.o. Alyse Low Primary Care Ewin Rehberg: Cynda Familia Other Clinician: Referring Irene Collings: Treating Shaiden Aldous/Extender: Willia Craze in Treatment: 0 Abuse/Suicide Risk Screen Items Answer ABUSE RISK SCREEN: Has anyone close to you tried to hurt or harm you recentlyo No Do you feel uncomfortable with anyone in your familyo No Has anyone forced you do things that you didnt want to doo No Electronic Signature(s) Signed: 01/02/2021 6:17:43 PM By: Rolan Lipa Entered By: Rolan Lipa on 01/02/2021 16:24:58 -------------------------------------------------------------------------------- Activities of Daily Living Details Patient Name: Date of Service: Todd, Collier 01/02/2021 2:45 PM Medical Record Number: [id number] Patient Account Number: [id number] Date of Birth/Sex: Treating RN: Aug 17, 1955 (65 y.o. Alyse Low Primary Care Hiilani Jetter: Cynda Familia Other Clinician: Referring Arieonna Medine: Treating Gaylin Osoria/Extender: Willia Craze in Treatment: 0 Activities of Daily Living Items Answer Activities of Daily Living (Please select one for each item) Drive Automobile Completely Able T Medications ake Completely Able Use T elephone Completely Able Care for Appearance Completely Able Use T oilet Completely Able Bath / Shower Completely Able Dress Self Completely Able Feed Self Completely Able Walk Completely Able Get In / Out Bed Completely Able Housework Completely Able Prepare Meals Completely Able Handle Money Completely Able Shop for Self Completely Able Electronic Signature(s) Signed: 01/02/2021 6:17:43 PM By: Rolan Lipa Entered By:  Rolan Lipa on 01/02/2021 16:25:16 -------------------------------------------------------------------------------- Education Screening Details Patient Name: Date of Service: Todd Price, [name] 01/02/2021 2:45 PM Medical Record Number: [id number] Patient Account Number: [id number] Date of Birth/Sex: Treating RN: 27-Apr-1956 (65 y.o. Alyse Low Primary Care Yazleemar Strassner: Cynda Familia Other Clinician: Referring Jiali Linney: Treating Tashan Kreitzer/Extender: Willia Craze in Treatment: 0 Primary Learner Assessed: Patient Learning Preferences/Education Level/Primary Language Learning Preference: Explanation, Printed Material Highest Education Level: High School Preferred Language: English Cognitive Barrier Language Barrier: No Translator Needed: No Memory Deficit: No Emotional Barrier: No Cultural/Religious Beliefs Affecting Medical Care: No Physical Barrier Impaired Vision: No Impaired Hearing: No Decreased Hand dexterity: No Knowledge/Comprehension Knowledge Level: Medium Comprehension Level: Medium Ability to understand written instructions: Medium Ability to understand verbal instructions: Medium Motivation Anxiety Level: Calm Cooperation: Cooperative Education Importance: Acknowledges Need Interest in Health Problems: Asks Questions Perception: Coherent Willingness to Engage in Self-Management High Activities: Readiness to Engage in Self-Management High Activities: Electronic Signature(s) Signed: 01/02/2021 6:17:43 PM By: Rolan Lipa Entered By: Rolan Lipa on 01/02/2021 16:25:49 -------------------------------------------------------------------------------- Fall Risk Assessment Details Patient Name: Date of Service: Todd Price, [name] 01/02/2021 2:45 PM Medical Record Number: [id number] Patient Account Number: [id number] Date of Birth/Sex: Treating RN: 10-13-1955 (65 y.o. Alyse Low Primary Care Sebrina Kessner: Cynda Familia Other Clinician: Referring Laurel Smeltz: Treating Keegan Bensch/Extender: Willia Craze in Treatment: 0 Fall Risk Assessment Items Have you had 2 or more falls in the last 12 monthso 0 No Have you had any fall that resulted in injury in the last 12 monthso 0 No FALLS RISK SCREEN History of falling - immediate or within 3 months 0 No Secondary diagnosis (Do you have 2 or more medical diagnoseso) 0 No Ambulatory aid None/bed rest/wheelchair/nurse 0 No Crutches/cane/walker 0 No Furniture 0 No Intravenous therapy Access/Saline/Heparin Lock 0 No Gait/Transferring Normal/ bed rest/ wheelchair 0 No Weak (short steps with or without shuffle, stooped  but able to lift head while walking, may seek 0 No support from furniture) Impaired (short steps with shuffle, may have difficulty arising from chair, head down, impaired 0 No balance) Mental Status Oriented to own ability 0 Yes Electronic Signature(s) Signed: 01/02/2021 6:17:43 PM By: Rolan Lipa Entered By: Rolan Lipa on 01/02/2021 16:26:05 -------------------------------------------------------------------------------- Foot Assessment Details Patient Name: Date of Service: Todd Price, [name] 01/02/2021 2:45 PM Medical Record Number: [id number] Patient Account Number: [id number] Date of Birth/Sex: Treating RN: [birth year] (65 y.o. Alyse Low Primary Care Mirna Sutcliffe: Cynda Familia Other Clinician: Referring Tarren Velardi: Treating Fay Bagg/Extender: Willia Craze in Treatment: 0 Foot Assessment Items Site Locations + = Sensation present, - = Sensation absent, C = Callus, U = Ulcer R = Redness, W = Warmth, M = Maceration, PU = Pre-ulcerative lesion F = Fissure, S = Swelling, D = Dryness Assessment Right: Left: Other Deformity: No No Prior Foot Ulcer: No No Prior Amputation: No No Charcot Joint: No No Ambulatory Status: Gait: Notes black intact smashed toenail in region  until last Sunday. pt had scraped dried blood off then accidently set a piece of furniture ( a vanity) on it . Xray of toe negative.Has been working on house at [employer] on the weekend. wears slip on sandals when moving furniture and working on house. Electronic Signature(s) Signed: 01/02/2021 6:17:43 PM By: Rolan Lipa Entered By: Rolan Lipa on 01/02/2021 16:31:48 -------------------------------------------------------------------------------- Nutrition Risk Screening Details Patient Name: Date of Service: [name], [name] 01/02/2021 2:45 PM Medical Record Number: [id number] Patient Account Number: [id number] Date of Birth/Sex: Treating RN: February 02, 1956 (65 y.o. Alyse Low Primary Care Cristo Ausburn: Cynda Familia Other Clinician: Referring Chinyere Galiano: Treating Marko Skalski/Extender: Willia Craze in Treatment: 0 Height (in): Weight (lbs): Body Mass Index (BMI): Nutrition Risk Screening Items Score Screening NUTRITION RISK SCREEN: I have an illness or condition that made me change the kind and/or amount of food I eat 0 No I eat fewer than two meals per day 0 No I eat few fruits and vegetables, or milk products 0 No I have three or more drinks of beer, liquor or wine almost every day 0 No I have tooth or mouth problems that make it hard for me to eat 0 No I don't always have enough money to buy the food I need 0 No I eat alone most of the time 0 No I take three or more different prescribed or over-the-counter drugs a day 0 No Without wanting to, I have lost or gained 10 pounds in the last six months 0 No I am not always physically able to shop, cook and/or feed myself 0 No Nutrition Protocols Good Risk Protocol Moderate Risk Protocol High Risk Proctocol Risk Level: Good Risk Score: 0 Electronic Signature(s) Signed: 01/02/2021 6:17:43 PM By: Rolan Lipa Entered By: Rolan Lipa on 01/02/2021 54:00:86

## 2021-01-03 ENCOUNTER — Other Ambulatory Visit (HOSPITAL_COMMUNITY): Payer: Self-pay | Admitting: Internal Medicine

## 2021-01-03 DIAGNOSIS — L97524 Non-pressure chronic ulcer of other part of left foot with necrosis of bone: Secondary | ICD-10-CM

## 2021-01-03 DIAGNOSIS — E11621 Type 2 diabetes mellitus with foot ulcer: Secondary | ICD-10-CM

## 2021-01-03 DIAGNOSIS — L97509 Non-pressure chronic ulcer of other part of unspecified foot with unspecified severity: Secondary | ICD-10-CM

## 2021-01-03 NOTE — Progress Notes (Addendum)
Todd AngDUGGINS, Todd ([id number]) Visit Report for 01/02/2021 Allergy List Details Patient Name: Date of Service: [name], [name] RK 01/02/2021 2:45 PM Medical Record Number: [id number] Patient Account Number: [id number] Date of Birth/Sex: Treating RN: [name], Todd (65 y.o. [name]) [name], [name] Primary Care [name]: [name], [name] Other Clinician: Referring [name]: Treating [name]: [name], [name], [name]: 0 Allergies Active Allergies No Known Drug Allergies Allergy Notes Electronic Signature(s) Signed: 01/02/2021 6:17:43 PM By: Todd LipaPriddy, [name] Entered By: Todd LipaPriddy, [name] on 01/02/2021 16:Collier:16 -------------------------------------------------------------------------------- Arrival Information Details Patient Name: Date of Service: Todd PriceUGGINS, [name] 01/02/2021 2:45 PM Medical Record Number: [id number] Patient Account Number: [id number] Date of Birth/Sex: Treating RN: [name], Todd (65 y.o. [name]) [name], [name] Primary Care [name]: [name], [name] Other Clinician: Referring [name]: Treating [name]/Extender: [name], [name], [name]: 0 Visit Information Patient Arrived: Ambulatory Arrival Time: 15:59 Accompanied By: self Transfer Assistance: None Patient Identification Verified: Yes Secondary Verification Process Completed: Yes History Since Last Visit Added or deleted any medications: No Any Todd allergies or adverse reactions: No Had a fall or experienced change in activities of daily living that may affect risk of falls: No Signs or symptoms of abuse/neglect since last visito No Hospitalized since last visit: No Implantable device outside of the clinic excluding cellular tissue based products placed in the center since last visit: No Has Dressing in Place as Prescribed: Yes Pain Present Now: No Electronic Signature(s) Signed: 01/02/2021 5:48:Collier PM By: [name], [name] Entered By: [name], [name] on 01/02/2021  16:00:06 -------------------------------------------------------------------------------- Clinic Level of Care Assessment Details Patient Name: Date of Service: Todd PrudeDUGGINS, [name] 01/02/2021 2:45 PM Medical Record Number: [id number] Patient Account Number: [id number] Date of Birth/Sex: Treating RN: [name], Todd (65 y.o. [name]) [name], [name] Primary Care [name]: [name], [name] Other Clinician: Referring [name]: Treating [name]/Extender: [name], [name], [name]: 0 Clinic Level of Care Assessment Items TOOL 1 Quantity Score X- 1 0 Use when EandM and Procedure is performed on INITIAL visit ASSESSMENTS - Nursing Assessment / Reassessment X- 1 20 General Physical Exam (combine w/ comprehensive assessment (listed just below) when performed on Todd pt. evals) X- 1 25 Comprehensive Assessment (HX, ROS, Risk Assessments, Wounds Hx, etc.) ASSESSMENTS - Wound and Skin Assessment / Reassessment []  - 0 Dermatologic / Skin Assessment (not related to wound area) ASSESSMENTS - Ostomy and/or Continence Assessment and Care []  - 0 Incontinence Assessment and Management []  - 0 Ostomy Care Assessment and Management (repouching, etc.) PROCESS - Coordination of Care []  - 0 Simple Patient / Family Education for ongoing care X- 1 20 Complex (extensive) Patient / Family Education for ongoing care X- 1 10 Staff obtains ChiropractorConsents, Records, T Results / Process Orders est []  - 0 Staff telephones HHA, Nursing Homes / Clarify orders / etc []  - 0 Routine Transfer to another Facility (non-emergent condition) []  - 0 Routine Hospital Admission (non-emergent condition) []  - 0 Todd Admissions / Manufacturing engineernsurance Authorizations / Ordering NPWT Apligraf, etc. , []  - 0 Emergency Hospital Admission (emergent condition) PROCESS - Special Needs []  - 0 Pediatric / Minor Patient Management []  - 0 Isolation Patient Management []  - 0 Hearing / Language / Visual special needs []  - 0 Assessment  of Community assistance (transportation, D/Collier planning, etc.) []  - 0 Additional assistance / Altered mentation []  - 0 Support Surface(s) Assessment (bed, cushion, seat, etc.) INTERVENTIONS - Miscellaneous []  - 0 External ear exam []  - 0 Patient Transfer (multiple staff / Nurse, adultHoyer Lift / Similar devices) []  - 0  Simple Staple / Suture removal (25 or less) []  - 0 Complex Staple / Suture removal (26 or more) []  - 0 Hypo/Hyperglycemic Management (do not check if billed separately) X- 1 15 Ankle / Brachial Index (ABI) - do not check if billed separately Has the patient been seen at the hospital within the last three years: Yes Total Score: 90 Level Of Care: Todd/Established - Level 3 Electronic Signature(s) Signed: 01/02/2021 7:08:Collier PM By: Signed: 01/02/2021 7:08:Collier PM By: Antonieta Iba Entered By: 03/05/2021 on 01/02/2021 17:16:49 -------------------------------------------------------------------------------- Encounter Discharge Information Details Patient Name: Date of Service: Todd [place], [name] 01/02/2021 2:45 PM Medical Record Number: [name] Patient Account Number: 03/05/2021 Date of Birth/Sex: Treating RN: 1956/07/01 (65 y.o. 4/29/Todd Primary Care [name]: 76 Other Clinician: Referring [name]: Treating [name]/Extender: Todd Low in Collier: 0 Encounter Discharge Information Items Post Procedure Vitals Discharge Condition: Stable Temperature (F): 98.5 Ambulatory Status: Ambulatory Pulse (bpm): 93 Discharge Destination: Home Respiratory Rate (breaths/min): 18 Transportation: Private Auto Blood Pressure (mmHg): 139/79 Accompanied By: alone Schedule Follow-up Appointment: No Clinical Summary of Care: Patient Declined Electronic Signature(s) Signed: 01/02/2021 6:17:43 PM By: Todd Craze Entered By: 03/05/2021 on 01/02/2021  18:09:40 -------------------------------------------------------------------------------- Lower Extremity Assessment Details Patient Name: Date of Service: Piney Collier, 03/05/2021 RK 01/02/2021 2:45 PM Medical Record Number: [name] Patient Account Number: 03/05/2021 Date of Birth/Sex: Treating RN: 09/16/Todd (65 y.o. 4/29/Todd Primary Care Kenadi Miltner: 76 Other Clinician: Referring Jaymes Revels: Treating Seraphine Gudiel/Extender: Todd Low in Collier: 0 Edema Assessment Assessed: Todd Familia: No] [Right: No] E[Left: dema] [Right: :] Calf Left: Right: Point of Measurement: 31 cm From Medial Instep 39 cm 39.5 cm Ankle Left: Right: Point of Measurement: 10 cm From Medial Instep 23.5 cm 22.9 cm Knee To Floor Left: Right: From Medial Instep 44 cm 44 cm Vascular Assessment Pulses: Dorsalis Pedis Palpable: [Left:Yes] [Right:Yes] Blood Pressure: Brachial: [Right:172] Ankle: [Right:Dorsalis Pedis: 184 Dorsalis Pedis: 185 1.07 1.08] Electronic Signature(s) Signed: 01/02/2021 6:17:43 PM By: Kyra Searles Entered By: 03/05/2021 on 01/02/2021 16:51:38 -------------------------------------------------------------------------------- Multi Wound Chart Details Patient Name: Date of Service: Todd Lipa, [name] 01/02/2021 2:45 PM Medical Record Number: Todd Price Patient Account Number: 03/05/2021 Date of Birth/Sex: Treating RN: 01/[birth year]/Todd (65 y.o. 4/29/Todd Primary Care Gid Schoffstall: 76 Other Clinician: Referring Cecillia Menees: Treating Sumer [place]/Extender: Todd Michaels in Collier: 0 Vital Signs Height(in): Pulse(bpm): 93 Weight(lbs): Blood Pressure(mmHg): 149/79 Body Mass Index(BMI): Temperature(F): 98.5 Respiratory Rate(breaths/min): 18 Photos: [5:No Photos Left T Great oe] [N/A:N/A N/A] Wound Location: [5:Trauma] [N/A:N/A] Wounding Event: [5:Diabetic Wound/Ulcer of the Lower] [N/A:N/A] Primary  Etiology: [5:Extremity Cataracts, Hypertension, Type II] [N/A:N/A] Comorbid History: [5:Diabetes, Neuropathy, Confinement Anxiety 12/24/2020] [N/A:N/A] Date Acquired: [5:0] [N/A:N/A] Weeks of Collier: [5:Open] [N/A:N/A] Wound Status: [5:1.5x2x1.2] [N/A:N/A] Measurements [name] x W x D (cm) [5:2.356] [N/A:N/A] A (cm) : rea [5:2.827] [N/A:N/A] Volume (cm) : [5:Grade 1] [N/A:N/A] Classification: [5:Medium] [N/A:N/A] Exudate A mount: [5:Serosanguineous] [N/A:N/A] Exudate Type: [5:red, brown] [N/A:N/A] Exudate Color: [5:Indistinct, nonvisible] [N/A:N/A] Wound Margin: [5:None Present (0%)] [N/A:N/A] Granulation A mount: [5:None Present (0%)] [N/A:N/A] Necrotic A mount: [5:Fat Layer (Subcutaneous Tissue): Yes N/A] Exposed Structures: [5:Fascia: No Tendon: No Muscle: No Joint: No Bone: No None] [N/A:N/A] Epithelialization: [5:Debridement - Excisional] [N/A:N/A] Debridement: Pre-procedure Verification/Time Out 16:59 [N/A:N/A] Taken: [5:Subcutaneous] [N/A:N/A] Tissue Debrided: [5:Skin/Subcutaneous Tissue] [N/A:N/A] Level: [5:3] [N/A:N/A] Debridement A (sq cm): [5:rea Forceps, Scissors] [N/A:N/A] Instrument: [5:Swab] [N/A:N/A] Specimen: [5:1] [N/A:N/A] Number of Specimens Taken: [5:Minimum] [N/A:N/A] Bleeding: [5:Pressure] [  N/A:N/A] Hemostasis A chieved: [5:Procedure was tolerated well] [N/A:N/A] Debridement Collier Response: [5:1.5x2x1.2] [N/A:N/A] Post Debridement Measurements [name] x W x D (cm) [5:2.827] [N/A:N/A] Post Debridement Volume: (cm) [5:Debridement] [N/A:N/A] Procedures Performed: Collier Notes Electronic Signature(s) Signed: 01/02/2021 7:08:Collier PM By: Antonieta Iba Signed: 01/03/2021 1:36:13 PM By: Baltazar Najjar MD Entered By: Baltazar Najjar on 01/02/2021 17:21:25 -------------------------------------------------------------------------------- Multi-Disciplinary Care Plan Details Patient Name: Date of Service: [place], [name] 01/02/2021 2:45 PM Medical Record Number:  [id number] Patient Account Number: [id number] Date of Birth/Sex: Treating RN: [birth year] (65 y.o. Todd Michaels Primary Care Mileidy Atkin: Todd Familia Other Clinician: Referring Delana Manganello: Treating Alliana Mcauliff/Extender: Todd Craze in Collier: 0 Active Inactive Nutrition Nursing Diagnoses: Impaired glucose control: actual or potential Goals: Patient/caregiver verbalizes understanding of need to maintain therapeutic glucose control per primary care physician Date Initiated: 01/02/2021 Target Resolution Date: 01/30/2021 Goal Status: Active Interventions: Assess HgA1c results as ordered upon admission and as needed Provide education on elevated blood sugars and impact on wound healing Notes: Wound/Skin Impairment Nursing Diagnoses: Impaired tissue integrity Goals: Patient/caregiver will verbalize understanding of skin care regimen Date Initiated: 01/02/2021 Target Resolution Date: 01/30/2021 Goal Status: Active Ulcer/skin breakdown will have a volume reduction of 30% by week 4 Date Initiated: 01/02/2021 Target Resolution Date: 01/30/2021 Goal Status: Active Interventions: Assess patient/caregiver ability to obtain necessary supplies Assess patient/caregiver ability to perform ulcer/skin care regimen upon admission and as needed Assess ulceration(s) every visit Provide education on ulcer and skin care Collier Activities: Topical wound management initiated : 01/02/2021 Notes: Electronic Signature(s) Signed: 01/02/2021 7:08:Collier PM By: Antonieta Iba Entered By: Antonieta Iba on 01/02/2021 17:03:06 -------------------------------------------------------------------------------- Pain Assessment Details Patient Name: Date of Service: Todd Price, [name] 01/02/2021 2:45 PM Medical Record Number: [id number] Patient Account Number: [id number] Date of Birth/Sex: Treating RN: Todd[birth year] (65 y.o. Todd Low Primary Care Zyere Jiminez: Todd Familia Other  Clinician: Referring Robley Matassa: Treating Ugochi Henzler/Extender: Todd Craze in Collier: 0 Active Problems Location of Pain Severity and Description of Pain Patient Has Paino No Site Locations With Dressing Change: Yes Duration of the Pain. Constant / Intermittento Intermittent Rate the pain. Current Pain Level: 0 Worst Pain Level: 0 Least Pain Level: 0 Tolerable Pain Level: 0 Pain Management and Medication Current Pain Management: Notes no sensation in lower extremities Electronic Signature(s) Signed: 01/02/2021 6:17:43 PM By: [name] Entered By: [name] on 01/02/2021 16:37:48 -------------------------------------------------------------------------------- Patient/Caregiver Education Details Patient Name: Date of Service: Lorrene Reid RK 7/5/2022andnbsp2:45 PM Medical Record Number: [id number] Patient Account Number: [id number] Date of Birth/Gender: Treating RN: [birth year] Todd (65 y.o. Todd Michaels Primary Care Physician: Todd Familia Other Clinician: Referring Physician: Treating Physician/Extender: Todd Craze in Collier: 0 Education Assessment Education Provided To: Patient Education Topics Provided Elevated Blood Todd/ Impact on Healing: Methods: Explain/Verbal, Printed Responses: State content correctly Offloading: Methods: Explain/Verbal, Printed Responses: State content correctly Wound/Skin Impairment: Methods: Explain/Verbal, Printed Responses: State content correctly Electronic Signature(s) Signed: 01/02/2021 7:08:Collier PM By: Antonieta Iba Entered By: Antonieta Iba on 01/02/2021 17:16:13 -------------------------------------------------------------------------------- Wound Assessment Details Patient Name: Date of Service: Todd Price, [name] 01/02/2021 2:45 PM Medical Record Number: [id number] Patient Account Number: [id number] Date of Birth/Sex: Treating RN: [birth year] (65 y.o. Todd Low Primary Care Hoa Deriso: Todd Familia Other Clinician: Referring Eirik Schueler: Treating Amira Podolak/Extender: Todd Craze in Collier: 0 Wound Status Wound Number: 5 Primary Diabetic Wound/Ulcer of the Lower Extremity Etiology: Wound Location: Left T Great oe Wound Status: Open Wounding  Event: Trauma Comorbid Cataracts, Hypertension, Type II Diabetes, Neuropathy, Date Acquired: 12/24/2020 History: Confinement Anxiety Weeks Of Collier: 0 Clustered Wound: No Photos Wound Measurements Length: (cm) 1.5 Width: (cm) 2 Depth: (cm) 1.2 Area: (cm) 2.356 Volume: (cm) 2.827 % Reduction in Area: 0% % Reduction in Volume: 0% Epithelialization: None Tunneling: No Undermining: No Wound Description Classification: Grade 1 Wound Margin: Indistinct, nonvisible Exudate Amount: Medium Exudate Type: Serosanguineous Exudate Color: red, brown Foul Odor After Cleansing: No Slough/Fibrino No Wound Bed Granulation Amount: None Present (0%) Exposed Structure Necrotic Amount: None Present (0%) Fascia Exposed: No Fat Layer (Subcutaneous Tissue) Exposed: Yes Tendon Exposed: No Muscle Exposed: No Joint Exposed: No Bone Exposed: No Collier Notes Wound #5 (Toe Great) Wound Laterality: Left Cleanser Soap and Water Discharge Instruction: May shower and wash wound with dial antibacterial soap and water prior to dressing change. Peri-Wound Care Topical Primary Dressing KerraCel Ag Gelling Fiber Dressing, 4x5 in (silver alginate) Discharge Instruction: Pack lightly into wound and wrap around toe Secondary Dressing Woven Gauze Sponges 2x2 in Discharge Instruction: Apply over primary dressing as directed. Secured With Conforming Stretch Gauze Bandage, Sterile 2x75 (in/in) Discharge Instruction: Secure with stretch gauze as directed. 8M Medipore H Soft Cloth Surgical T ape, 2x2 (in/yd) Discharge Instruction: Secure dressing with tape as  directed. Compression Wrap Compression Stockings Add-Ons Electronic Signature(s) Signed: 01/03/2021 5:07:06 PM By: [name] Signed: 01/03/2021 5:22:20 PM By: Todd Ito Previous Signature: 01/02/2021 6:17:43 PM Version By: [name] Entered By: Todd Ito on 01/03/2021 16:33:48 -------------------------------------------------------------------------------- Vitals Details Patient Name: Date of Service: Todd Price, [name] 01/02/2021 2:45 PM Medical Record Number: [id number] Patient Account Number: [id number] Date of Birth/Sex: Treating RN: Todd[birth year] (65 y.o. Todd Michaels Primary Care Tylor Courtwright: Todd Familia Other Clinician: Referring Shannie Kontos: Treating Stefanie Hodgens/Extender: Todd Craze in Collier: 0 Vital Signs Time Taken: 16:00 Temperature (F): 98.5 Pulse (bpm): 93 Respiratory Rate (breaths/min): 18 Blood Pressure (mmHg): 149/79 Reference Range: 80 - 120 mg / dl Electronic Signature(s) Signed: 01/02/2021 5:48:Collier PM By: Todd Ito Entered By: Todd Ito on 01/02/2021 16:00:26

## 2021-01-03 NOTE — Progress Notes (Addendum)
[name], [name] ([id number]) Visit Report for 01/02/2021 Chief Complaint Document Details Patient Name: Date of Service: [name], [name] 01/02/2021 2:45 PM Medical Record Number: [id number] Patient Account Number: [id number] Date of Birth/Sex: Treating RN: 05-18-1956 (65 y.o. [name] Primary Care Provider: Cynda Familia Other Clinician: Referring Provider: Treating Provider/Extender: Willia Craze in Treatment: 0 Information Obtained from: Patient Chief Complaint Right 1st T Ulcer oe 01/25/2020; patient returns to clinic for review of a wound on again on the right plantar first toe 01/02/2021; patient returns to clinic for a review of the wound on the tip of his left first toe dorsally Electronic Signature(s) Signed: 01/03/2021 1:36:13 PM By: Baltazar Najjar MD Entered By: Baltazar Najjar on 01/02/2021 17:22:12 -------------------------------------------------------------------------------- Debridement Details Patient Name: Date of Service: [name] [name] 01/02/2021 2:45 PM Medical Record Number: [id number] Patient Account Number: [id number] Date of Birth/Sex: Treating RN: 06/07/1956 (65 y.o. [name] Primary Care Provider: Cynda Familia Other Clinician: Referring Provider: Treating Provider/Extender: Willia Craze in Treatment: 0 Debridement Performed for Assessment: Wound #5 Left T Great oe Performed By: Physician Maxwell Caul., MD Debridement Type: Debridement Severity of Tissue Pre Debridement: Bone involvement without necrosis Level of Consciousness (Pre-procedure): Awake and Alert Pre-procedure Verification/Time Out Yes - 16:59 Taken: Start Time: 17:00 T Area Debrided (L x W): otal 1.5 (cm) x 2 (cm) = 3 (cm) Tissue and other material debrided: Non-Viable, Subcutaneous, Skin: Dermis Level: Skin/Subcutaneous Tissue Debridement Description: Excisional Instrument: Forceps, Scissors Specimen:  Swab, Number of Specimens T aken: 1 Bleeding: Minimum Hemostasis Achieved: Pressure End Time: 17:07 Response to Treatment: Procedure was tolerated well Level of Consciousness (Post- Awake and Alert procedure): Post Debridement Measurements of Total Wound Length: (cm) 1.5 Width: (cm) 2 Depth: (cm) 1.2 Volume: (cm) 2.827 Character of Wound/Ulcer Post Debridement: Stable Severity of Tissue Post Debridement: Bone involvement without necrosis Post Procedure Diagnosis Same as Pre-procedure Electronic Signature(s) Signed: 01/02/2021 7:08:24 PM By: Antonieta Iba Signed: 01/03/2021 1:36:13 PM By: Baltazar Najjar MD Entered By: Baltazar Najjar on 01/02/2021 17:21:49 -------------------------------------------------------------------------------- HPI Details Patient Name: Date of Service: [place] Price, [name] 01/02/2021 2:45 PM Medical Record Number: [id number] Patient Account Number: [id number] Date of Birth/Sex: Treating RN: [birth year] (65 y.o. [name] Primary Care Provider: Cynda Familia Other Clinician: Referring Provider: Treating Provider/Extender: Willia Craze in Treatment: 0 History of Present Illness HPI Description: 12/16/18 on evaluation today patient presents for initial evaluation our clinic as a self-referral due to a wound that is had on his right first toe for 6-8 weeks. He states his last hemoglobin A1c which was roughly 4 months ago was 11.0 he knows he has not been taking care of himself in this regard. He's actually been out of medications for two weeks both in regard to his diabetes as well as his high blood pressure. He states he is going to see those doctors in the next week. Subsequently he actually told me that being here in the clinic and seeing so many patients that did have applications in such has really got him thinking about the fact that he needs to take better care of himself in this regard. Fortunately there's no signs of active  infection at this time which is good news. No fevers, chills, nausea, or vomiting noted at this time. He has been seen a local podiatrist for the past 6-8 weeks he was recommending for the patient that he soak in Epsom salts and come  in on the patient tells me generally Tuesday and Thursday for debridement's and otherwise was told to leave the wound open to air just put a sock on and go about his business and that "it would scab up and be okay". With that being said the patient states when he realized things were not getting better and he really did not agree with the treatment plan he decided to get a second opinion here the wound care center. Apparently he's had x-rays although the patient's podiatrist has not sent [place] records at this point I think we may want to send them to the hospital for an x-ray just to have everything documented before we see about what we need to do from the standpoint of more aggressive treatment such as potentially casting all over this be his right foot that's gonna be somewhat difficult in my pinion. 12/23/18 on evaluation today patient actually appears to be doing okay in regard to his plantar foot ulcer. Unfortunately he was not able to go get the x-ray during the last visit here in the office. Subsequently he states that he actually forgot to go do that in the interim between last week and this week. That is something is gonna go do as soon as he leaves here today. We discussed again that I do think the total contact cast would be the appropriate way to go in the best way to get the news to heal. With that being said beginning the x-ray clear before we proceed with the cast he understands. 6/26; patient here for obligatory total contact cast change no complaints 12/30/18 on evaluation today patient actually appears to be doing excellent in regard to his plantar foot wound. This is looking tremendously better there's Todd callous buildup it's not as deep and overall show  signs of excellent improvement. I'm very pleased in this regard. Overall I think that he should continue with the Current wound care measures including the total contact cast as I believe this is gonna be most helpful in getting this to heal very quickly. 01/06/19 on evaluation today patient actually appears to be doing quite well with regard to his toe ulcer. This is shown signs of good improvement in fact is measuring smaller by about half of what it was last week. Nonetheless I think this is excellent improvement is filled in an overall is making great progress. No fevers, chills, nausea, or vomiting noted at this time. 7/16; patient is here for review of a wound on the plantar aspect of his right great toe. Been using silver collagen under a total contact cast 01/20/19 on evaluation today patient actually presents for a slightly early follow-up due to the fact that his cast has been rubbing on the right medial ankle region. Unfortunately he has a Todd wound at this location because of the rubbing of the cast. There does not appear to be signs of active infection which is good news. No fevers, chills, nausea, or vomiting noted at this time. Overall been pleased with how things have progressed in regard to his toe again today this appears to be doing well. 02/01/19-Patient returns at 12 days being out of the cast on account of going to the beach, he has been using Prisma to his wounds on his right medial ankle and right great toe plantar aspect. Both appear to be around the same dimensions. 02/08/19-Patient returns at 1 week after being in a cast both the wounds are better in fact the right great plantar wound  is healed, right medial ankle wound looks better. We are using Prisma. Does not need to be in cast anymore 8/24; 2-week follow-up. Everything is healed here including the right great plantar toe and the cast injury on the medial ankle. He still has callus on the plantar right great toe. He has gone  to hangers I believe and has custom inserts at least. His wounds have closed over READMISSION 01/25/2020. This is a 65 year old man with poorly controlled type 2 diabetes with a recent hemoglobin A1c of 9.3. He also has diabetic neuropathy. We had him here extensively in 2020 again for a wound on the plantar aspect of the right great toe. Eventually we heal this over and a total contact cast. He got custom inserts for his running shoes at [employer]. He has been using them reliably. He states that roughly 3 to 4 weeks ago he developed a thick callus on the bottom of the toe it eventually separated or split and underneath was an open wound. He saw his primary doctor who would and put him on doxycycline for 10 days. Past medical history type 2 diabetes with peripheral neuropathy, hyperlipidemia and hypertension. ABI in our clinic was 1.17 on the right 02/11/20-Patient back at 2 weeks, right great toe plantar wound is about the same with slight callus build up. 8/27; 2-week follow-up right. Aggressively debrided last time considerable callus buildup. We have been using silver collagen. This is definitely going to require a total contact cast however a week today his daughter gets married we elected to delay that until the Monday after that I E 10 days from now. 9/7; right plantar toe. Aggressively debrided last time and the wound actually looks some better with less depth. 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 In view of this I did not put a total contact cast on. Also noted he is having marked indentation of his right great toe in the surgical shoe he is wearing 9/14; right plantar great toe. Surface of the wound looks healthy again he has overriding skin. He is leaving for [place] on vacation on 9/24, after this I think he will require a total contact cast. We started him off on silver collagen for some reason he changed to silver  alginate I am going to change him back to silver collagen today 10/5; right plantar great toe. He is returned from [place] he was not offloading this at all I am not exactly sure whether he was even dressing this. Arrives with extensive undermining from the wound on the plantar aspect of the right great toe. He has a surgical shoe but nothing else no felt etc. 10/12; right plantar great toe. The wound does not look unhealthy however its has thick edges overhanging skin. He is ready for a total contact cast. Still using silver collagen as the primary dressing 10/15; the patient is here for the obligatory first total contact cast change. This was done in the standard fashion the wound was not examined 10/19 wound bed looks smaller but still some depth. Healthy granulation. Using silver collagen under a total contact cast 10/26; small wound minimal depth no undermining or using silver collagen under a total contact cast 11/2; after debridement today of callus subcutaneous tissue from the wound margin there is only a very tiny open area remaining. Continue silver collagen under total contact cast should be closed by next week 11/9; still thick callus around  the wound margin and this is still open perhaps slightly larger than last week. Cast is not in good shape when he arrives back in the clinic. We have been using silver collagen as the primary dressing I changed to [facility] 11/16; the patient arrives in clinic today with the area fully epithelialized. This is on the plantar right great toe. Part of this still looks vulnerable to me and after discussion we elected to put this back in a total contact cast. Especially in view of the recurrent difficulties we have had in this area. He is a diabetic with peripheral neuropathy. We have been using a total contact cast for several weeks now. According to my notes from about October 12 READMISSION 01/02/2021 This is a 65 year old man we have had in this  clinic at least twice in the past predominantly with wounds on his right plantar great toe. His last visit here was in 2021. His current problem is on the left great toe. He says for several months he had what he thought was traumatic old blood under the left great toenail likely secondary to a trauma. He would pick areas and release some of the blood however this never really healed. Last month he dropped a vanity on the right great toe and the nail came off. He soaked this in bed at 9 and then went to the beach about 10 days ago to work on a house he owns. He developed rapid swelling of the left great toe and when he returned he went to the urgent care on Sunday and then the ER 2 days later. He was initially given doxycycline at the urgent care but subsequently changed to Bactrim DS. An x-ray was negative not showing any evidence of bone destruction. He has not been systemically unwell. Past medical history includes type 2 diabetes with peripheral neuropathy. He has not had a hemoglobin A1c since last fall and it was over 10. As noted he has a history of diabetic foot ulcers however they were mostly on the right plantar great toe. He also has hyperlipidemia and hypertension ABIs in our clinic today were 1.07 on the right and 1.08 on the left Electronic Signature(s) Signed: 01/03/2021 1:36:13 PM By: Baltazar Najjar MD Entered By: Baltazar Najjar on 01/02/2021 17:45:14 -------------------------------------------------------------------------------- Physical Exam Details Patient Name: Date of Service: [place] Price, [name] 01/02/2021 2:45 PM Medical Record Number: [id number] Patient Account Number: [id number] Date of Birth/Sex: Treating RN: 1956-04-22 (65 y.o. [name] Primary Care Provider: Cynda Familia Other Clinician: Referring Provider: Treating Provider/Extender: Willia Craze in Treatment: 0 Constitutional Patient is hypertensive.. Pulse regular and within  target range for patient.Marland Kitchen Respirations regular, non-labored and within target range.. Temperature is normal and within the target range for the patient.Marland Kitchen Appears in no distress. Cardiovascular Pedal pulses on the left are easily palpable. Notes Wound exam; the area of the left great toe does not look healthy. There was an ulcer at the tip of the left great toe where the nail edge would have been. There was a small probing area here with purulent drainage. This probes right through the toe itself and more proximally to the tip of the distal phalanx. I suspect the bone here is involved in spite of the x-ray. Skin is coming off the toes suggesting underlying infection however by his description this is actually probably somewhat better. He has bogginess over the interphalangeal joint which also concerns me. Electronic Signature(s) Signed: 01/03/2021 1:36:13 PM By: Leanord Hawking,  Casimiro Needle MD Entered By: Baltazar Najjar on 01/02/2021 17:47:34 -------------------------------------------------------------------------------- Physician Orders Details Patient Name: Date of Service: [name], [name] 01/02/2021 2:45 PM Medical Record Number: [id number] Patient Account Number: [id number] Date of Birth/Sex: Treating RN: 15-Mar-1956 (65 y.o. [name] Primary Care Provider: Cynda Familia Other Clinician: Referring Provider: Treating Provider/Extender: Willia Craze in Treatment: 0 Verbal / Phone Orders: No Diagnosis Coding Follow-up Appointments ppointment in 1 week. - Dr. Leanord Hawking Return A Other: - Halo Loyal Buba) for Supplies Bathing/ Shower/ Hygiene May shower and wash wound with soap and water. Off-Loading Open toe surgical shoe to: Other: - Keep pressure off top of foot/toe Additional Orders / Instructions Follow Nutritious Diet - -Monitor blood sugars - Increase protein, 100-120g daily Wound Treatment Wound #5 - T Great oe Wound Laterality: Left Cleanser: Soap and  Water 1 x Per Day/30 Days Discharge Instructions: May shower and wash wound with dial antibacterial soap and water Todd to dressing change. Prim Dressing: KerraCel Ag Gelling Fiber Dressing, 4x5 in (silver alginate) 1 x Per Day/30 Days ary Discharge Instructions: Pack lightly into wound and wrap around toe Secondary Dressing: Woven Gauze Sponges 2x2 in 1 x Per Day/30 Days Discharge Instructions: Apply over primary dressing as directed. Secured With: Insurance underwriter, Sterile 2x75 (in/in) 1 x Per Day/30 Days Discharge Instructions: Secure with stretch gauze as directed. Secured With: 12M Medipore H Soft Cloth Surgical Tape, 2x2 (in/yd) 1 x Per Day/30 Days Discharge Instructions: Secure dressing with tape as directed. Laboratory naerobe culture (MICRO) - Left Great Toe Bacteria identified in Unspecified specimen by A LOINC Code: 635-3 Convenience Name: Anerobic culture Radiology Magnetic Resonance Imaging (MRI), with and without Contrast: Left Foot - ASAP to R/O Osteomyelitis, Septic Arthritis Electronic Signature(s) Signed: 01/02/2021 7:08:24 PM By: Antonieta Iba Signed: 01/03/2021 1:36:13 PM By: Baltazar Najjar MD Entered By: Antonieta Iba on 01/02/2021 19:07:39 Prescription 01/02/2021 -------------------------------------------------------------------------------- Gennaro Africa MD Patient Name: Provider: 1956/02/14 [id number] Date of Birth: NPI#: Judie Petit [id number] Sex: DEA #: [phone] [id number] Phone #: License #: Eligha Bridegroom [facility] Patient Address: [address] [place] [address] [place], [place] [address] [address] [place], [place] [address] [phone] Allergies No Known Drug Allergies Provider's Orders Magnetic Resonance Imaging (MRI), with and without Contrast: Left Foot - ASAP to R/O Osteomyelitis, Septic Arthritis Hand Signature: Date(s): Electronic Signature(s) Signed: 01/02/2021 7:08:24 PM By: Antonieta Iba Signed: 01/03/2021 1:36:13 PM By: Baltazar Najjar MD Entered By: Antonieta Iba on 01/02/2021 19:07:39 -------------------------------------------------------------------------------- Problem List Details Patient Name: Date of Service: [name], [name] 01/02/2021 2:45 PM Medical Record Number: [id number] Patient Account Number: [id number] Date of Birth/Sex: Treating RN: July 12, 1955 (65 y.o. [name] Primary Care Provider: Cynda Familia Other Clinician: Referring Provider: Treating Provider/Extender: Reita Cliche, Hilma Favors in Treatment: 0 Active Problems ICD-10 Encounter Code Description Active Date MDM Diagnosis E11.621 Type 2 diabetes mellitus with foot ulcer 01/02/2021 No Yes L97.524 Non-pressure chronic ulcer of other part of left foot with necrosis of bone 01/02/2021 No Yes M86.172 Other acute osteomyelitis, left ankle and foot 01/02/2021 No Yes E11.42 Type 2 diabetes mellitus with diabetic polyneuropathy 01/02/2021 No Yes Inactive Problems Resolved Problems Electronic Signature(s) Signed: 01/03/2021 1:36:13 PM By: Baltazar Najjar MD Entered By: Baltazar Najjar on 01/02/2021 17:21:12 -------------------------------------------------------------------------------- Progress Note Details Patient Name: Date of Service: [place] Price, [name] 01/02/2021 2:45 PM Medical Record Number: [id number] Patient Account Number: [id number] Date of Birth/Sex: Treating RN: [birth year] (65  y.o. Judie Petit) Antonieta Iba Primary Care Provider: Cynda Familia Other Clinician: Referring Provider: Treating Provider/Extender: Willia Craze in Treatment: 0 Subjective Chief Complaint Information obtained from Patient Right 1st T Ulcer 01/25/2020; patient returns to clinic for review of a wound on again on the right plantar first toe 01/02/2021; patient returns to clinic for a review oe of the wound on the tip of his left first toe dorsally History of Present Illness  (HPI) 12/16/18 on evaluation today patient presents for initial evaluation our clinic as a self-referral due to a wound that is had on his right first toe for 6-8 weeks. He states his last hemoglobin A1c which was roughly 4 months ago was 11.0 he knows he has not been taking care of himself in this regard. He's actually been out of medications for two weeks both in regard to his diabetes as well as his high blood pressure. He states he is going to see those doctors in the next week. Subsequently he actually told me that being here in the clinic and seeing so many patients that did have applications in such has really got him thinking about the fact that he needs to take better care of himself in this regard. Fortunately there's no signs of active infection at this time which is good news. No fevers, chills, nausea, or vomiting noted at this time. He has been seen a local podiatrist for the past 6-8 weeks he was recommending for the patient that he soak in Epsom salts and come in on the patient tells me generally Tuesday and Thursday for debridement's and otherwise was told to leave the wound open to air just put a sock on and go about his business and that "it would scab up and be okay". With that being said the patient states when he realized things were not getting better and he really did not agree with the treatment plan he decided to get a second opinion here the wound care center. Apparently he's had x-rays although the patient's podiatrist has not sent [place] records at this point I think we may want to send them to the hospital for an x-ray just to have everything documented before we see about what we need to do from the standpoint of more aggressive treatment such as potentially casting all over this be his right foot that's gonna be somewhat difficult in my pinion. 12/23/18 on evaluation today patient actually appears to be doing okay in regard to his plantar foot ulcer. Unfortunately he was not  able to go get the x-ray during the last visit here in the office. Subsequently he states that he actually forgot to go do that in the interim between last week and this week. That is something is gonna go do as soon as he leaves here today. We discussed again that I do think the total contact cast would be the appropriate way to go in the best way to get the news to heal. With that being said beginning the x-ray clear before we proceed with the cast he understands. 6/26; patient here for obligatory total contact cast change no complaints 12/30/18 on evaluation today patient actually appears to be doing excellent in regard to his plantar foot wound. This is looking tremendously better there's Todd callous buildup it's not as deep and overall show signs of excellent improvement. I'm very pleased in this regard. Overall I think that he should continue with the Current wound care measures including the total contact  cast as I believe this is gonna be most helpful in getting this to heal very quickly. 01/06/19 on evaluation today patient actually appears to be doing quite well with regard to his toe ulcer. This is shown signs of good improvement in fact is measuring smaller by about half of what it was last week. Nonetheless I think this is excellent improvement is filled in an overall is making great progress. No fevers, chills, nausea, or vomiting noted at this time. 7/16; patient is here for review of a wound on the plantar aspect of his right great toe. Been using silver collagen under a total contact cast 01/20/19 on evaluation today patient actually presents for a slightly early follow-up due to the fact that his cast has been rubbing on the right medial ankle region. Unfortunately he has a Todd wound at this location because of the rubbing of the cast. There does not appear to be signs of active infection which is good news. No fevers, chills, nausea, or vomiting noted at this time. Overall been pleased  with how things have progressed in regard to his toe again today this appears to be doing well. 02/01/19-Patient returns at 12 days being out of the cast on account of going to the beach, he has been using Prisma to his wounds on his right medial ankle and right great toe plantar aspect. Both appear to be around the same dimensions. 02/08/19-Patient returns at 1 week after being in a cast both the wounds are better in fact the right great plantar wound is healed, right medial ankle wound looks better. We are using Prisma. Does not need to be in cast anymore 8/24; 2-week follow-up. Everything is healed here including the right great plantar toe and the cast injury on the medial ankle. He still has callus on the plantar right great toe. He has gone to hangers I believe and has custom inserts at least. His wounds have closed over READMISSION 01/25/2020. This is a 65 year old man with poorly controlled type 2 diabetes with a recent hemoglobin A1c of 9.3. He also has diabetic neuropathy. We had him here extensively in 2020 again for a wound on the plantar aspect of the right great toe. Eventually we heal this over and a total contact cast. He got custom inserts for his running shoes at [employer]. He has been using them reliably. He states that roughly 3 to 4 weeks ago he developed a thick callus on the bottom of the toe it eventually separated or split and underneath was an open wound. He saw his primary doctor who would and put him on doxycycline for 10 days. Past medical history type 2 diabetes with peripheral neuropathy, hyperlipidemia and hypertension. ABI in our clinic was 1.17 on the right 02/11/20-Patient back at 2 weeks, right great toe plantar wound is about the same with slight callus build up. 8/27; 2-week follow-up right. Aggressively debrided last time considerable callus buildup. We have been using silver collagen. This is definitely going to require a total contact cast however a week today his  daughter gets married we elected to delay that until the Monday after that I E 10 days from now. 9/7; right plantar toe. Aggressively debrided last time and the wound actually looks some better with less depth. 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 In view of this I did not put a total contact cast on. Also noted he is having  marked indentation of his right great toe in the surgical shoe he is wearing 9/14; right plantar great toe. Surface of the wound looks healthy again he has overriding skin. He is leaving for [place] on vacation on 9/24, after this I think he will require a total contact cast. We started him off on silver collagen for some reason he changed to silver alginate I am going to change him back to silver collagen today 10/5; right plantar great toe. He is returned from [place] he was not offloading this at all I am not exactly sure whether he was even dressing this. Arrives with extensive undermining from the wound on the plantar aspect of the right great toe. He has a surgical shoe but nothing else no felt etc. 10/12; right plantar great toe. The wound does not look unhealthy however its has thick edges overhanging skin. He is ready for a total contact cast. Still using silver collagen as the primary dressing 10/15; the patient is here for the obligatory first total contact cast change. This was done in the standard fashion the wound was not examined 10/19 wound bed looks smaller but still some depth. Healthy granulation. Using silver collagen under a total contact cast 10/26; small wound minimal depth no undermining or using silver collagen under a total contact cast 11/2; after debridement today of callus subcutaneous tissue from the wound margin there is only a very tiny open area remaining. Continue silver collagen under total contact cast should be closed by next week 11/9; still thick callus around the  wound margin and this is still open perhaps slightly larger than last week. Cast is not in good shape when he arrives back in the clinic. We have been using silver collagen as the primary dressing I changed to [facility] 11/16; the patient arrives in clinic today with the area fully epithelialized. This is on the plantar right great toe. Part of this still looks vulnerable to me and after discussion we elected to put this back in a total contact cast. Especially in view of the recurrent difficulties we have had in this area. He is a diabetic with peripheral neuropathy. We have been using a total contact cast for several weeks now. According to my notes from about October 12 READMISSION 01/02/2021 This is a 65 year old man we have had in this clinic at least twice in the past predominantly with wounds on his right plantar great toe. His last visit here was in 2021. His current problem is on the left great toe. He says for several months he had what he thought was traumatic old blood under the left great toenail likely secondary to a trauma. He would pick areas and release some of the blood however this never really healed. Last month he dropped a vanity on the right great toe and the nail came off. He soaked this in bed at 9 and then went to the beach about 10 days ago to work on a house he owns. He developed rapid swelling of the left great toe and when he returned he went to the urgent care on Sunday and then the ER 2 days later. He was initially given doxycycline at the urgent care but subsequently changed to Bactrim DS. An x-ray was negative not showing any evidence of bone destruction. He has not been systemically unwell. Past medical history includes type 2 diabetes with peripheral neuropathy. He has not had a hemoglobin A1c since last fall and it was over 10. As noted he  has a history of diabetic foot ulcers however they were mostly on the right plantar great toe. He also has hyperlipidemia and  hypertension ABIs in our clinic today were 1.07 on the right and 1.08 on the left Patient History Information obtained from Patient. Allergies No Known Drug Allergies Family History Diabetes - Father, Hypertension - Father,Siblings, Lung Disease - Mother, No family history of Cancer, Heart Disease, Kidney Disease, Seizures, Stroke, Thyroid Problems, Tuberculosis. Social History Never smoker, Marital Status - Married, Alcohol Use - Rarely, Drug Use - No History, Caffeine Use - Rarely. Medical History Eyes Patient has history of Cataracts Denies history of Glaucoma, Optic Neuritis Ear/Nose/Mouth/Throat Denies history of Chronic sinus problems/congestion, Middle ear problems Respiratory Denies history of Aspiration, Asthma, Chronic Obstructive Pulmonary Disease (COPD), Pneumothorax, Sleep Apnea, Tuberculosis Cardiovascular Patient has history of Hypertension Denies history of Angina, Arrhythmia, Congestive Heart Failure, Coronary Artery Disease, Deep Vein Thrombosis, Hypotension, Myocardial Infarction, Peripheral Arterial Disease, Peripheral Venous Disease, Phlebitis, Vasculitis Gastrointestinal Denies history of Cirrhosis , Colitis, Crohnoos, Hepatitis A, Hepatitis B, Hepatitis C Endocrine Patient has history of Type II Diabetes Denies history of Type I Diabetes Genitourinary Denies history of End Stage Renal Disease Integumentary (Skin) Denies history of History of Burn Musculoskeletal Denies history of Gout, Rheumatoid Arthritis, Osteoarthritis, Osteomyelitis Neurologic Patient has history of Neuropathy Denies history of Dementia, Quadriplegia, Paraplegia, Seizure Disorder Oncologic Denies history of Received Chemotherapy, Received Radiation Psychiatric Patient has history of Confinement Anxiety Denies history of Anorexia/bulimia Hospitalization/Surgery History - cataract removed left eye. Medical A Surgical History  Notes nd Eyes retinopathy Cardiovascular hyperlipidemia Objective Constitutional Patient is hypertensive.. Pulse regular and within target range for patient.Marland Kitchen Respirations regular, non-labored and within target range.. Temperature is normal and within the target range for the patient.Marland Kitchen Appears in no distress. Vitals Time Taken: 4:00 PM, Temperature: 98.5 F, Pulse: 93 bpm, Respiratory Rate: 18 breaths/min, Blood Pressure: 149/79 mmHg. Cardiovascular Pedal pulses on the left are easily palpable. General Notes: Wound exam; the area of the left great toe does not look healthy. There was an ulcer at the tip of the left great toe where the nail edge would have been. There was a small probing area here with purulent drainage. This probes right through the toe itself and more proximally to the tip of the distal phalanx. I suspect the bone here is involved in spite of the x-ray. Skin is coming off the toes suggesting underlying infection however by his description this is actually probably somewhat better. He has bogginess over the interphalangeal joint which also concerns me. Integumentary (Hair, Skin) Wound #5 status is Open. Original cause of wound was Trauma. The date acquired was: 12/24/2020. The wound is located on the Left T Great. The wound oe measures 1.5cm length x 2cm width x 1.2cm depth; 2.356cm^2 area and 2.827cm^3 volume. There is Fat Layer (Subcutaneous Tissue) exposed. There is no tunneling or undermining noted. There is a medium amount of serosanguineous drainage noted. The wound margin is indistinct and nonvisible. There is no granulation within the wound bed. There is no necrotic tissue within the wound bed. Assessment Active Problems ICD-10 Type 2 diabetes mellitus with foot ulcer Non-pressure chronic ulcer of other part of left foot with necrosis of bone Other acute osteomyelitis, left ankle and foot Type 2 diabetes mellitus with diabetic polyneuropathy Procedures Wound  #5 Pre-procedure diagnosis of Wound #5 is a Diabetic Wound/Ulcer of the Lower Extremity located on the Left T Great .Severity of Tissue Pre Debridement is: oe Bone involvement without  necrosis. There was a Excisional Skin/Subcutaneous Tissue Debridement with a total area of 3 sq cm performed by Maxwell Caul., MD. With the following instrument(s): Forceps, and Scissors to remove Non-Viable tissue/material. Material removed includes Subcutaneous Tissue and Skin: Dermis and. 1 specimen was taken by a Swab and sent to the lab per facility protocol. A time out was conducted at 16:59, Todd to the start of the procedure. A Minimum amount of bleeding was controlled with Pressure. The procedure was tolerated well. Post Debridement Measurements: 1.5cm length x 2cm width x 1.2cm depth; 2.827cm^3 volume. Character of Wound/Ulcer Post Debridement is stable. Severity of Tissue Post Debridement is: Bone involvement without necrosis. Post procedure Diagnosis Wound #5: Same as Pre-Procedure Plan Follow-up Appointments: Return Appointment in 1 week. - Dr. Leanord Hawking Bathing/ Shower/ Hygiene: May shower and wash wound with soap and water. Off-Loading: Open toe surgical shoe to: Other: - Keep pressure off top of foot/toe Additional Orders / Instructions: Follow Nutritious Diet - -Monitor blood sugars - Increase protein, 100-120g daily Laboratory ordered were: Anerobic culture - Left Great Toe Radiology ordered were: Magnetic Resonance Imaging (MRI), with and without Contrast: Left Foot - ASAP to R/O Osteomyelitis, Septic Arthritis WOUND #5: - T Great Wound Laterality: Left oe Cleanser: Soap and Water 1 x Per Day/30 Days Discharge Instructions: May shower and wash wound with dial antibacterial soap and water Todd to dressing change. Prim Dressing: KerraCel Ag Gelling Fiber Dressing, 4x5 in (silver alginate) 1 x Per Day/30 Days ary Discharge Instructions: Pack lightly into wound and wrap around  toe Secondary Dressing: Woven Gauze Sponges 2x2 in 1 x Per Day/30 Days Discharge Instructions: Apply over primary dressing as directed. Secured With: Insurance underwriter, Sterile 2x75 (in/in) 1 x Per Day/30 Days Discharge Instructions: Secure with stretch gauze as directed. Secured With: 52M Medipore H Soft Cloth Surgical T ape, 2x2 (in/yd) 1 x Per Day/30 Days Discharge Instructions: Secure dressing with tape as directed. 1. Likely a Wagner's 3 diabetic foot ulcer 2. I suspect acute osteomyelitis under this area based on the extent of the small probing area and the texture of the bone to the curette 3. I am also concerned about septic arthritis of the interphalangeal joint 4. The patient will definitely need an MRI with contrast ASAP 5. I did not change the Bactrim DS however I did culture some purulent drainage coming out of this potential draining site. 6. I told the patient that he is at high risk of losing this toe. 7. Silver alginate packing in the probing site and wrapped around the toe itself. He is lost a lot of surface epithelium in this area likely because of the extent of the infection I spent 45 minutes in review of this patient's past medical history, face-to-face evaluation and preparation of this record Electronic Signature(s) Signed: 01/03/2021 1:36:13 PM By: Baltazar Najjar MD Entered By: Baltazar Najjar on 01/02/2021 17:49:57 -------------------------------------------------------------------------------- HxROS Details Patient Name: Date of Service: Todd Price, [name] 01/02/2021 2:45 PM Medical Record Number: [id number] Patient Account Number: [id number] Date of Birth/Sex: Treating RN: [birth year] (65 y.o. Alyse Low Primary Care Provider: Cynda Familia Other Clinician: Referring Provider: Treating Provider/Extender: Willia Craze in Treatment: 0 Information Obtained From Patient Eyes Medical History: Positive for:  Cataracts Negative for: Glaucoma; Optic Neuritis Past Medical History Notes: retinopathy Ear/Nose/Mouth/Throat Medical History: Negative for: Chronic sinus problems/congestion; Middle ear problems Respiratory Medical History: Negative for: Aspiration; Asthma; Chronic Obstructive Pulmonary Disease (COPD); Pneumothorax; Sleep Apnea; Tuberculosis  Cardiovascular Medical History: Positive for: Hypertension Negative for: Angina; Arrhythmia; Congestive Heart Failure; Coronary Artery Disease; Deep Vein Thrombosis; Hypotension; Myocardial Infarction; Peripheral Arterial Disease; Peripheral Venous Disease; Phlebitis; Vasculitis Past Medical History Notes: hyperlipidemia Gastrointestinal Medical History: Negative for: Cirrhosis ; Colitis; Crohns; Hepatitis A; Hepatitis B; Hepatitis C Endocrine Medical History: Positive for: Type II Diabetes Negative for: Type I Diabetes Time with diabetes: 8 years Treated with: Insulin, Oral agents Blood sugar tested every day: No Genitourinary Medical History: Negative for: End Stage Renal Disease Integumentary (Skin) Medical History: Negative for: History of Burn Musculoskeletal Medical History: Negative for: Gout; Rheumatoid Arthritis; Osteoarthritis; Osteomyelitis Neurologic Medical History: Positive for: Neuropathy Negative for: Dementia; Quadriplegia; Paraplegia; Seizure Disorder Oncologic Medical History: Negative for: Received Chemotherapy; Received Radiation Psychiatric Medical History: Positive for: Confinement Anxiety Negative for: Anorexia/bulimia HBO Extended History Items Eyes: Cataracts Immunizations Pneumococcal Vaccine: Received Pneumococcal Vaccination: No Implantable Devices None Hospitalization / Surgery History Type of Hospitalization/Surgery cataract removed left eye Family and Social History Cancer: No; Diabetes: Yes - Father; Heart Disease: No; Hypertension: Yes - Father,Siblings; Kidney Disease: No; Lung  Disease: Yes - Mother; Seizures: No; Stroke: No; Thyroid Problems: No; Tuberculosis: No; Never smoker; Marital Status - Married; Alcohol Use: Rarely; Drug Use: No History; Caffeine Use: Rarely; Financial Concerns: No; Food, Clothing or Shelter Needs: No; Support System Lacking: No; Transportation Concerns: No Electronic Signature(s) Signed: 01/02/2021 6:17:43 PM By: Rolan Lipa Signed: 01/03/2021 1:36:13 PM By: Baltazar Najjar MD Entered By: Rolan Lipa on 01/02/2021 16:24:48 -------------------------------------------------------------------------------- SuperBill Details Patient Name: Date of Service: [name], [name] 01/02/2021 Medical Record Number: [id number] Patient Account Number: [id number] Date of Birth/Sex: Treating RN: 05/08/1956 (65 y.o. [name] Primary Care Provider: Cynda Familia Other Clinician: Referring Provider: Treating Provider/Extender: Willia Craze in Treatment: 0 Diagnosis Coding ICD-10 Codes Code Description [phone] Type 2 diabetes mellitus with foot ulcer L97.524 Non-pressure chronic ulcer of other part of left foot with necrosis of bone M86.172 Other acute osteomyelitis, left ankle and foot E11.42 Type 2 diabetes mellitus with diabetic polyneuropathy Facility Procedures CPT4 Code: [id number] Description: 99213 - WOUND CARE VISIT-LEV 3 EST PT Modifier: 25 Quantity: 1 CPT4 Code: [id number] Description: 11042 - DEB SUBQ TISSUE 20 SQ CM/< ICD-10 Diagnosis Description L97.524 Non-pressure chronic ulcer of other part of left foot with necrosis of bone E11.621 Type 2 diabetes mellitus with foot ulcer Modifier: Quantity: 1 Physician Procedures : CPT4 Code Description Modifier [id number] 99215 - WC PHYS LEVEL 5 - EST PT 25 ICD-10 Diagnosis Description E11.621 Type 2 diabetes mellitus with foot ulcer L97.524 Non-pressure chronic ulcer of other part of left foot with necrosis of bone M86.172 Other  acute osteomyelitis, left  ankle and foot Quantity: 1 : [id number] 11042 - WC PHYS SUBQ TISS 20 SQ CM ICD-10 Diagnosis Description L97.524 Non-pressure chronic ulcer of other part of left foot with necrosis of bone E11.621 Type 2 diabetes mellitus with foot ulcer Quantity: 1 Electronic Signature(s) Signed: 01/16/2021 3:40:10 PM By: Antonieta Iba Signed: 01/16/2021 5:24:30 PM By: Baltazar Najjar MD Previous Signature: 01/03/2021 1:36:13 PM Version By: Baltazar Najjar MD Entered By: Antonieta Iba on 01/16/2021 15:40:10

## 2021-01-04 ENCOUNTER — Encounter (HOSPITAL_COMMUNITY): Payer: Self-pay

## 2021-01-04 ENCOUNTER — Ambulatory Visit (HOSPITAL_COMMUNITY): Payer: 59

## 2021-01-04 ENCOUNTER — Ambulatory Visit (HOSPITAL_COMMUNITY)
Admission: RE | Admit: 2021-01-04 | Discharge: 2021-01-04 | Disposition: A | Discharge status: Home / Self Care | Payer: 59 | Source: Ambulatory Visit | Attending: Internal Medicine | Admitting: Internal Medicine

## 2021-01-04 DIAGNOSIS — E11621 Type 2 diabetes mellitus with foot ulcer: Secondary | ICD-10-CM | POA: Diagnosis present

## 2021-01-04 DIAGNOSIS — M869 Osteomyelitis, unspecified: Secondary | ICD-10-CM | POA: Diagnosis present

## 2021-01-04 DIAGNOSIS — E1169 Type 2 diabetes mellitus with other specified complication: Secondary | ICD-10-CM | POA: Insufficient documentation

## 2021-01-04 DIAGNOSIS — L97524 Non-pressure chronic ulcer of other part of left foot with necrosis of bone: Secondary | ICD-10-CM

## 2021-01-04 DIAGNOSIS — L97509 Non-pressure chronic ulcer of other part of unspecified foot with unspecified severity: Secondary | ICD-10-CM | POA: Diagnosis present

## 2021-01-04 MED ORDER — GADOBUTROL 1 MMOL/ML IV SOLN
10.0000 mL | Freq: Once | INTRAVENOUS | Status: AC | PRN
  Administered 2021-01-04: 10 mL via INTRAVENOUS

## 2021-01-05 LAB — AEROBIC CULTURE W GRAM STAIN (SUPERFICIAL SPECIMEN)

## 2021-01-08 ENCOUNTER — Encounter (HOSPITAL_BASED_OUTPATIENT_CLINIC_OR_DEPARTMENT_OTHER): Payer: 59 | Admitting: Internal Medicine

## 2021-01-09 ENCOUNTER — Encounter (HOSPITAL_BASED_OUTPATIENT_CLINIC_OR_DEPARTMENT_OTHER): Payer: 59 | Admitting: Internal Medicine

## 2021-01-09 ENCOUNTER — Other Ambulatory Visit: Payer: Self-pay

## 2021-01-09 DIAGNOSIS — E11621 Type 2 diabetes mellitus with foot ulcer: Secondary | ICD-10-CM | POA: Diagnosis not present

## 2021-01-10 NOTE — Progress Notes (Signed)
[name], [name] ([id number]) Visit Report for 01/09/2021 HPI Details Patient Name: Date of Service: [place], [place] 01/09/2021 3:45 PM Medical Record Number: [id number] Patient Account Number: [id number] Date of Birth/Sex: Treating RN: Jan 08, 1956 (65 y.o. [name], [name] Primary Care Provider: Cynda Familia Other Clinician: Referring Provider: Treating Provider/Extender: Willia Craze in Treatment: 1 History of Present Illness HPI Description: 12/16/18 on evaluation today patient presents for initial evaluation our clinic as a self-referral due to a wound that is had on his right first toe for 6-8 weeks. He states his last hemoglobin A1c which was roughly 4 months ago was 11.0 he knows he has not been taking care of himself in this regard. He's actually been out of medications for two weeks both in regard to his diabetes as well as his high blood pressure. He states he is going to see those doctors in the next week. Subsequently he actually told me that being here in the clinic and seeing so many patients that did have applications in such has really got him thinking about the fact that he needs to take better care of himself in this regard. Fortunately there's no signs of active infection at this time which is good news. No fevers, chills, nausea, or vomiting noted at this time. He has been seen a local podiatrist for the past 6-8 weeks he was recommending for the patient that he soak in Epsom salts and come in on the patient tells me generally Tuesday and Thursday for debridement's and otherwise was told to leave the wound open to air just put a sock on and go about his business and that "it would scab up and be okay". With that being said the patient states when he realized things were not getting better and he really did not agree with the treatment plan he decided to get a second opinion here the wound care center. Apparently he's had x-rays although the  patient's podiatrist has not sent [place] records at this point I think we may want to send them to the hospital for an x-ray just to have everything documented before we see about what we need to do from the standpoint of more aggressive treatment such as potentially casting all over this be his right foot that's gonna be somewhat difficult in my pinion. 12/23/18 on evaluation today patient actually appears to be doing okay in regard to his plantar foot ulcer. Unfortunately he was not able to go get the x-ray during the last visit here in the office. Subsequently he states that he actually forgot to go do that in the interim between last week and this week. That is something is gonna go do as soon as he leaves here today. We discussed again that I do think the total contact cast would be the appropriate way to go in the best way to get the news to heal. With that being said beginning the x-ray clear before we proceed with the cast he understands. 6/26; patient here for obligatory total contact cast change no complaints 12/30/18 on evaluation today patient actually appears to be doing excellent in regard to his plantar foot wound. This is looking tremendously better there's new callous buildup it's not as deep and overall show signs of excellent improvement. I'm very pleased in this regard. Overall I think that he should continue with the Current wound care measures including the total contact cast as I believe this is gonna be most helpful in getting this to heal  very quickly. 01/06/19 on evaluation today patient actually appears to be doing quite well with regard to his toe ulcer. This is shown signs of good improvement in fact is measuring smaller by about half of what it was last week. Nonetheless I think this is excellent improvement is filled in an overall is making great progress. No fevers, chills, nausea, or vomiting noted at this time. 7/16; patient is here for review of a wound on the plantar aspect  of his right great toe. Been using silver collagen under a total contact cast 01/20/19 on evaluation today patient actually presents for a slightly early follow-up due to the fact that his cast has been rubbing on the right medial ankle region. Unfortunately he has a new wound at this location because of the rubbing of the cast. There does not appear to be signs of active infection which is good news. No fevers, chills, nausea, or vomiting noted at this time. Overall been pleased with how things have progressed in regard to his toe again today this appears to be doing well. 02/01/19-Patient returns at 12 days being out of the cast on account of going to the beach, he has been using Prisma to his wounds on his right medial ankle and right great toe plantar aspect. Both appear to be around the same dimensions. 02/08/19-Patient returns at 1 week after being in a cast both the wounds are better in fact the right great plantar wound is healed, right medial ankle wound looks better. We are using Prisma. Does not need to be in cast anymore 8/24; 2-week follow-up. Everything is healed here including the right great plantar toe and the cast injury on the medial ankle. He still has callus on the plantar right great toe. He has gone to [employer] I believe and has custom inserts at least. His wounds have closed over READMISSION 01/25/2020. This is a 65 year old man with poorly controlled type 2 diabetes with a recent hemoglobin A1c of 9.3. He also has diabetic neuropathy. We had him here extensively in 2020 again for a wound on the plantar aspect of the right great toe. Eventually we heal this over and a total contact cast. He got custom inserts for his running shoes at [employer]. He has been using them reliably. He states that roughly 3 to 4 weeks ago he developed a thick callus on the bottom of the toe it eventually separated or split and underneath was an open wound. He saw his primary doctor who would and put him on  doxycycline for 10 days. Past medical history type 2 diabetes with peripheral neuropathy, hyperlipidemia and hypertension. ABI in our clinic was 1.17 on the right 02/11/20-Patient back at 2 weeks, right great toe plantar wound is about the same with slight callus build up. 8/27; 2-week follow-up right. Aggressively debrided last time considerable callus buildup. We have been using silver collagen. This is definitely going to require a total contact cast however a week today his daughter gets married we elected to delay that until the Monday after that I E 10 days from now. 9/7; right plantar toe. Aggressively debrided last time and the wound actually looks some better with less depth. 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 In view of this I did not put a total contact cast on. Also noted he is having marked indentation of his right great toe in the surgical shoe he is wearing 9/14;  right plantar great toe. Surface of the wound looks healthy again he has overriding skin. He is leaving for [place] on vacation on 9/24, after this I think he will require a total contact cast. We started him off on silver collagen for some reason he changed to silver alginate I am going to change him back to silver collagen today 10/5; right plantar great toe. He is returned from [place] he was not offloading this at all I am not exactly sure whether he was even dressing this. Arrives with extensive undermining from the wound on the plantar aspect of the right great toe. He has a surgical shoe but nothing else no felt etc. 10/12; right plantar great toe. The wound does not look unhealthy however its has thick edges overhanging skin. He is ready for a total contact cast. Still using silver collagen as the primary dressing 10/15; the patient is here for the obligatory first total contact cast change. This was done in the standard fashion the wound was not  examined 10/19 wound bed looks smaller but still some depth. Healthy granulation. Using silver collagen under a total contact cast 10/26; small wound minimal depth no undermining or using silver collagen under a total contact cast 11/2; after debridement today of callus subcutaneous tissue from the wound margin there is only a very tiny open area remaining. Continue silver collagen under total contact cast should be closed by next week 11/9; still thick callus around the wound margin and this is still open perhaps slightly larger than last week. Cast is not in good shape when he arrives back in the clinic. We have been using silver collagen as the primary dressing I [facility] 11/16; the patient arrives in clinic today with the area fully epithelialized. This is on the plantar right great toe. Part of this still looks vulnerable to me and after discussion we elected to put this back in a total contact cast. Especially in view of the recurrent difficulties we have had in this area. He is a diabetic with peripheral neuropathy. We have been using a total contact cast for several weeks now. According to my notes from about October 12 READMISSION 01/02/2021 This is a 65 year old man we have had in this clinic at least twice in the past predominantly with wounds on his right plantar great toe. His last visit here was in 2021. His current problem is on the left great toe. He says for several months he had what he thought was traumatic old blood under the left great toenail likely secondary to a trauma. He would pick areas and release some of the blood however this never really healed. Last month he dropped a vanity on the right great toe and the nail came off. He soaked this in bed at 9 and then went to the beach about 10 days ago to work on a house he owns. He developed rapid swelling of the left great toe and when he returned he went to the urgent care on Sunday and then the ER 2 days later.  He was initially given doxycycline at the urgent care but subsequently changed to Bactrim DS. An x-ray was negative not showing any evidence of bone destruction. He has not been systemically unwell. Past medical history includes type 2 diabetes with peripheral neuropathy. He has not had a hemoglobin A1c since last fall and it was over 10. As noted he has a history of diabetic foot ulcers however they were mostly on the right  plantar great toe. He also has hyperlipidemia and hypertension ABIs in our clinic today were 1.07 on the right and 1.08 on the left 7/12; culture of the wound on the tip of the left great toe grew grew abundant group A strep he was placed on Bactrim DS when he went to the ER I will replace this today with Augmentin. MRI of the foot showed a soft tissue ulceration distally with abnormal marrow signals and enhancement in the distal phalanx consistent with osteomyelitis there was no involvement of the proximal phalanx. I had an extensive discussion with the patient. His options would be a prolonged course of hyperbaric oxygen with concomitant antibiotics versus I think a partial toe amputation on the left through the interphalangeal joint. There is no reason to believe that this would not heal. Electronic Signature(s) Signed: 01/09/2021 5:25:56 PM By: Baltazar Najjar MD Entered By: Baltazar Najjar on 01/09/2021 16:13:47 -------------------------------------------------------------------------------- Physical Exam Details Patient Name: Date of Service: Todd Price, [name] 01/09/2021 3:45 PM Medical Record Number: [id number] Patient Account Number: [id number] Date of Birth/Sex: Treating RN: 1955/07/10 (65 y.o. [name], [name] Primary Care Provider: Cynda Familia Other Clinician: Referring Provider: Treating Provider/Extender: Willia Craze in Treatment: 1 Constitutional Sitting or standing Blood Pressure is within target range for patient.. Pulse  regular and within target range for patient.Marland Kitchen Respirations regular, non-labored and within target range.. Temperature is normal and within the target range for the patient.Marland Kitchen Appears in no distress. Notes Wound exam; tip of the left great toe. He has a little more epithelialization and some parts of this however there is a probing wound at the tip of the right great toe right where the tip of his nail would be. This goes straight through to the other side. Probing proximally you can feel the bone itself which does not feel that healthy. Still purulent looking drainage Electronic Signature(s) Signed: 01/09/2021 5:25:56 PM By: Baltazar Najjar MD Entered By: Baltazar Najjar on 01/09/2021 16:14:46 -------------------------------------------------------------------------------- Physician Orders Details Patient Name: Date of Service: [place], [name] 01/09/2021 3:45 PM Medical Record Number: [id number] Patient Account Number: [id number] Date of Birth/Sex: Treating RN: [birth year] (65 y.o. [name], [name] Primary Care Provider: Cynda Familia Other Clinician: Referring Provider: Treating Provider/Extender: Willia Craze in Treatment: 1 Verbal / Phone Orders: No Diagnosis Coding Follow-up Appointments ppointment in 1 week. - Dr. Leanord Hawking Return A Be sure you pick up your new antibiotics today. We have referred you to [facility] to see a Podiatrist. Please be on the lookout for a phonecall from their office as they will call to schedule you:) Other: - [employer] for Supplies Bathing/ Shower/ Hygiene May shower and wash wound with soap and water. Off-Loading Open toe surgical shoe to: Other: - Keep pressure off top of foot/toe Additional Orders / Instructions Follow Nutritious Diet - -Monitor blood sugars - Increase protein, 100-120g daily Wound Treatment Wound #5 - T Great oe Wound Laterality: Left Cleanser: Soap and Water 1 x Per Day/30  Days Discharge Instructions: May shower and wash wound with dial antibacterial soap and water prior to dressing change. Prim Dressing: KerraCel Ag Gelling Fiber Dressing, 4x5 in (silver alginate) 1 x Per Day/30 Days ary Discharge Instructions: Pack lightly into wound and wrap around toe Secondary Dressing: Woven Gauze Sponges 2x2 in 1 x Per Day/30 Days Discharge Instructions: Apply over primary dressing as directed. Secured With: Insurance underwriter, Sterile 2x75 (in/in) 1 x Per Day/30 Days  Discharge Instructions: Secure with stretch gauze as directed. Secured With: 83M Medipore H Soft Cloth Surgical Tape, 2x2 (in/yd) 1 x Per Day/30 Days Discharge Instructions: Secure dressing with tape as directed. 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 Patient Medications llergies: No Known Drug Allergies A Notifications Medication Indication Start End osteomyelits left first 01/09/2021 Augmentin toe DOSE oral 875 mg-125 mg tablet - 1 tablet oral bid for 2 weeks (d/c bactrim) Electronic Signature(s) Signed: 01/09/2021 5:25:56 PM By: Baltazar Najjar MD Signed: 01/10/2021 6:12:45 PM By: Fonnie Mu RN Previous Signature: 01/09/2021 3:47:50 PM Version By: Baltazar Najjar MD Entered By: Fonnie Mu on 01/09/2021 16:14:55 Prescription 01/09/2021 -------------------------------------------------------------------------------- Gennaro Africa MD Patient Name: Provider: Sep 26, 1955 [id number] Date of Birth: NPI#: Judie Petit [id number] Sex: DEA#: [phone] Phone #: License #: Eligha Bridegroom [facility] Patient Address: [address] [place], [name] [address] [address] [place], [name] [address] [phone] Allergies No Known Drug Allergies Provider's Orders Podiatry - Referral r/t diabetic non-healing ulcer of left great toe and positive  osteomyelitis diagnosis. Hand Signature: Date(s): Electronic Signature(s) Signed: 01/09/2021 5:25:56 PM By: Baltazar Najjar MD Signed: 01/10/2021 6:12:45 PM By: Fonnie Mu RN Entered By: Fonnie Mu on 01/09/2021 16:14:55 -------------------------------------------------------------------------------- Problem List Details Patient Name: Date of Service: [place], [name] 01/09/2021 3:45 PM Medical Record Number: [id number] Patient Account Number: [id number] Date of Birth/Sex: Treating RN: 25-Oct-1955 (65 y.o. [name], [name] Primary Care Provider: Cynda Familia Other Clinician: Referring Provider: Treating Provider/Extender: Reita Cliche, Hilma Favors in Treatment: 1 Active Problems ICD-10 Encounter Code Description Active Date MDM Diagnosis E11.621 Type 2 diabetes mellitus with foot ulcer 01/02/2021 No Yes L97.524 Non-pressure chronic ulcer of other part of left foot with necrosis of bone 01/02/2021 No Yes M86.172 Other acute osteomyelitis, left ankle and foot 01/02/2021 No Yes E11.42 Type 2 diabetes mellitus with diabetic polyneuropathy 01/02/2021 No Yes Inactive Problems Resolved Problems Electronic Signature(s) Signed: 01/09/2021 5:25:56 PM By: Baltazar Najjar MD Entered By: Baltazar Najjar on 01/09/2021 16:11:20 -------------------------------------------------------------------------------- Progress Note Details Patient Name: Date of Service: Todd Price, [name] 01/09/2021 3:45 PM Medical Record Number: [id number] Patient Account Number: [id number] Date of Birth/Sex: Treating RN: [birth year] (65 y.o. [name], [name] Primary Care Provider: Cynda Familia Other Clinician: Referring Provider: Treating Provider/Extender: Willia Craze in Treatment: 1 Subjective History of Present Illness (HPI) 12/16/18 on evaluation today patient presents for initial evaluation our clinic as a self-referral due to a wound that is had on his  right first toe for 6-8 weeks. He states his last hemoglobin A1c which was roughly 4 months ago was 11.0 he knows he has not been taking care of himself in this regard. He's actually been out of medications for two weeks both in regard to his diabetes as well as his high blood pressure. He states he is going to see those doctors in the next week. Subsequently he actually told me that being here in the clinic and seeing so many patients that did have applications in such has really got him thinking about the fact that he needs to take better care of himself in this regard. Fortunately there's no signs of active infection at this time which is good news. No fevers, chills, nausea, or vomiting noted at this time. He has been seen a local podiatrist for the past 6-8 weeks he was recommending for the patient that he  soak in Epsom salts and come in on the patient tells me generally Tuesday and Thursday for debridement's and otherwise was told to leave the wound open to air just put a sock on and go about his business and that "it would scab up and be okay". With that being said the patient states when he realized things were not getting better and he really did not agree with the treatment plan he decided to get a second opinion here the wound care center. Apparently he's had x-rays although the patient's podiatrist has not sent [place] records at this point I think we may want to send them to the hospital for an x-ray just to have everything documented before we see about what we need to do from the standpoint of more aggressive treatment such as potentially casting all over this be his right foot that's gonna be somewhat difficult in my pinion. 12/23/18 on evaluation today patient actually appears to be doing okay in regard to his plantar foot ulcer. Unfortunately he was not able to go get the x-ray during the last visit here in the office. Subsequently he states that he actually forgot to go do that in the  interim between last week and this week. That is something is gonna go do as soon as he leaves here today. We discussed again that I do think the total contact cast would be the appropriate way to go in the best way to get the news to heal. With that being said beginning the x-ray clear before we proceed with the cast he understands. 6/26; patient here for obligatory total contact cast change no complaints 12/30/18 on evaluation today patient actually appears to be doing excellent in regard to his plantar foot wound. This is looking tremendously better there's new callous buildup it's not as deep and overall show signs of excellent improvement. I'm very pleased in this regard. Overall I think that he should continue with the Current wound care measures including the total contact cast as I believe this is gonna be most helpful in getting this to heal very quickly. 01/06/19 on evaluation today patient actually appears to be doing quite well with regard to his toe ulcer. This is shown signs of good improvement in fact is measuring smaller by about half of what it was last week. Nonetheless I think this is excellent improvement is filled in an overall is making great progress. No fevers, chills, nausea, or vomiting noted at this time. 7/16; patient is here for review of a wound on the plantar aspect of his right great toe. Been using silver collagen under a total contact cast 01/20/19 on evaluation today patient actually presents for a slightly early follow-up due to the fact that his cast has been rubbing on the right medial ankle region. Unfortunately he has a new wound at this location because of the rubbing of the cast. There does not appear to be signs of active infection which is good news. No fevers, chills, nausea, or vomiting noted at this time. Overall been pleased with how things have progressed in regard to his toe again today this appears to be doing well. 02/01/19-Patient returns at 12 days being  out of the cast on account of going to the beach, he has been using Prisma to his wounds on his right medial ankle and right great toe plantar aspect. Both appear to be around the same dimensions. 02/08/19-Patient returns at 1 week after being in a cast both the wounds are better  in fact the right great plantar wound is healed, right medial ankle wound looks better. We are using Prisma. Does not need to be in cast anymore 8/24; 2-week follow-up. Everything is healed here including the right great plantar toe and the cast injury on the medial ankle. He still has callus on the plantar right great toe. He has gone to [employer] I believe and has custom inserts at least. His wounds have closed over READMISSION 01/25/2020. This is a 65 year old man with poorly controlled type 2 diabetes with a recent hemoglobin A1c of 9.3. He also has diabetic neuropathy. We had him here extensively in 2020 again for a wound on the plantar aspect of the right great toe. Eventually we heal this over and a total contact cast. He got custom inserts for his running shoes at [employer]. He has been using them reliably. He states that roughly 3 to 4 weeks ago he developed a thick callus on the bottom of the toe it eventually separated or split and underneath was an open wound. He saw his primary doctor who would and put him on doxycycline for 10 days. Past medical history type 2 diabetes with peripheral neuropathy, hyperlipidemia and hypertension. ABI in our clinic was 1.17 on the right 02/11/20-Patient back at 2 weeks, right great toe plantar wound is about the same with slight callus build up. 8/27; 2-week follow-up right. Aggressively debrided last time considerable callus buildup. We have been using silver collagen. This is definitely going to require a total contact cast however a week today his daughter gets married we elected to delay that until the Monday after that I E 10 days from now. 9/7; right plantar toe. Aggressively  debrided last time and the wound actually looks some better with less depth. 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 In view of this I did not put a total contact cast on. Also noted he is having marked indentation of his right great toe in the surgical shoe he is wearing 9/14; right plantar great toe. Surface of the wound looks healthy again he has overriding skin. He is leaving for [place] on vacation on 9/24, after this I think he will require a total contact cast. We started him off on silver collagen for some reason he changed to silver alginate I am going to change him back to silver collagen today 10/5; right plantar great toe. He is returned from [place] he was not offloading this at all I am not exactly sure whether he was even dressing this. Arrives with extensive undermining from the wound on the plantar aspect of the right great toe. He has a surgical shoe but nothing else no felt etc. 10/12; right plantar great toe. The wound does not look unhealthy however its has thick edges overhanging skin. He is ready for a total contact cast. Still using silver collagen as the primary dressing 10/15; the patient is here for the obligatory first total contact cast change. This was done in the standard fashion the wound was not examined 10/19 wound bed looks smaller but still some depth. Healthy granulation. Using silver collagen under a total contact cast 10/26; small wound minimal depth no undermining or using silver collagen under a total contact cast 11/2; after debridement today of callus subcutaneous tissue from the wound margin there is only a very tiny open area remaining. Continue silver collagen under total contact cast should be closed by next  week 11/9; still thick callus around the wound margin and this is still open perhaps slightly larger than last week. Cast is not in good shape when he arrives back in the clinic.  We have been using silver collagen as the primary dressing I changed to [facility] 11/16; the patient arrives in clinic today with the area fully epithelialized. This is on the plantar right great toe. Part of this still looks vulnerable to me and after discussion we elected to put this back in a total contact cast. Especially in view of the recurrent difficulties we have had in this area. He is a diabetic with peripheral neuropathy. We have been using a total contact cast for several weeks now. According to my notes from about October 12 READMISSION 01/02/2021 This is a 65 year old man we have had in this clinic at least twice in the past predominantly with wounds on his right plantar great toe. His last visit here was in 2021. His current problem is on the left great toe. He says for several months he had what he thought was traumatic old blood under the left great toenail likely secondary to a trauma. He would pick areas and release some of the blood however this never really healed. Last month he dropped a vanity on the right great toe and the nail came off. He soaked this in bed at 9 and then went to the beach about 10 days ago to work on a house he owns. He developed rapid swelling of the left great toe and when he returned he went to the urgent care on Sunday and then the ER 2 days later. He was initially given doxycycline at the urgent care but subsequently changed to Bactrim DS. An x-ray was negative not showing any evidence of bone destruction. He has not been systemically unwell. Past medical history includes type 2 diabetes with peripheral neuropathy. He has not had a hemoglobin A1c since last fall and it was over 10. As noted he has a history of diabetic foot ulcers however they were mostly on the right plantar great toe. He also has hyperlipidemia and hypertension ABIs in our clinic today were 1.07 on the right and 1.08 on the left 7/12; culture of the wound on the tip of the left  great toe grew grew abundant group A strep he was placed on Bactrim DS when he went to the ER I will replace this today with Augmentin. MRI of the foot showed a soft tissue ulceration distally with abnormal marrow signals and enhancement in the distal phalanx consistent with osteomyelitis there was no involvement of the proximal phalanx. I had an extensive discussion with the patient. His options would be a prolonged course of hyperbaric oxygen with concomitant antibiotics versus I think a partial toe amputation on the left through the interphalangeal joint. There is no reason to believe that this would not heal. Objective Constitutional Sitting or standing Blood Pressure is within target range for patient.. Pulse regular and within target range for patient.Marland Kitchen Respirations regular, non-labored and within target range.. Temperature is normal and within the target range for the patient.Marland Kitchen Appears in no distress. Vitals Time Taken: 3:14 PM, Temperature: 98.2 F, Pulse: 99 bpm, Respiratory Rate: 18 breaths/min, Blood Pressure: 134/76 mmHg. General Notes: Wound exam; tip of the left great toe. He has a little more epithelialization and some parts of this however there is a probing wound at the tip of the right great toe right where the tip of his nail  would be. This goes straight through to the other side. Probing proximally you can feel the bone itself which does not feel that healthy. Still purulent looking drainage Integumentary (Hair, Skin) Wound #5 status is Open. Original cause of wound was Trauma. The date acquired was: 12/24/2020. The wound has been in treatment 1 weeks. The wound is located on the Left T Great. The wound measures 1.7cm length x 2cm width x 1cm depth; 2.67cm^2 area and 2.67cm^3 volume. There is Fat Layer oe (Subcutaneous Tissue) exposed. There is no undermining noted, however, there is tunneling at 9:00 with a maximum distance of 0.9cm. There is a medium amount of serosanguineous  drainage noted. The wound margin is distinct with the outline attached to the wound base. There is large (67-100%) red, pink granulation within the wound bed. There is no necrotic tissue within the wound bed. 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 - Dr. Leanord Hawking Be sure you pick up your new antibiotics today. Other: - [employer] for Supplies Bathing/ Shower/ Hygiene: May shower and wash wound with soap and water. Off-Loading: Open toe surgical shoe to: Other: - Keep pressure off top of foot/toe Additional Orders / Instructions: Follow Nutritious Diet - -Monitor blood sugars - Increase protein, 100-120g daily The following medication(s) was prescribed: Augmentin oral 875 mg-125 mg tablet 1 tablet oral bid for 2 weeks (d/c bactrim) for osteomyelits left first toe starting 01/09/2021 WOUND #5: - T Great Wound Laterality: Left oe Cleanser: Soap and Water 1 x Per Day/30 Days Discharge Instructions: May shower and wash wound with dial antibacterial soap and water prior to dressing change. Prim Dressing: KerraCel Ag Gelling Fiber Dressing, 4x5 in (silver alginate) 1 x Per Day/30 Days ary Discharge Instructions: Pack lightly into wound and wrap around toe Secondary Dressing: Woven Gauze Sponges 2x2 in 1 x Per Day/30 Days Discharge Instructions: Apply over primary dressing as directed. Secured With: Insurance underwriter, Sterile 2x75 (in/in) 1 x Per Day/30 Days Discharge Instructions: Secure with stretch gauze as directed. Secured With: 84M Medipore H Soft Cloth Surgical T ape, 2x2 (in/yd) 1 x Per Day/30 Days Discharge Instructions: Secure dressing with tape as directed. 1. Augmentin 875 1 p.o. twice daily I  given him enough for 2 weeks which should cover the time it will take for surgical consultation. 2. I believe the patient is opted for the partial amputation. I explained to him that I am not a surgeon but I think the partial amputation of the left great toe would suffice 3. We are going to [facility] to [facility] for consideration of his surgical option. I do not think this is the wrong decision I think the amount of distraction he has going on here would probably preclude a high option of healing this area medically 4. Extensive discussion with myself and also work case Production designer, theatre/television/film. Electronic Signature(s) Signed: 01/09/2021 5:25:56 PM By: Baltazar Najjar MD Entered By: Baltazar Najjar on 01/09/2021 16:16:19 -------------------------------------------------------------------------------- SuperBill Details Patient Name: Date of Service: [name], [name] 01/09/2021 Medical Record Number: [id number] Patient Account Number: [id number] Date of Birth/Sex: Treating RN: [birth year] (65 y.o. [name], [name] Primary Care Provider: Cynda Familia Other Clinician: Referring Provider: Treating Provider/Extender: Willia Craze in Treatment: 1 Diagnosis Coding  ICD-10 Codes Code Description E11.621 Type 2 diabetes mellitus with foot ulcer L97.524 Non-pressure chronic ulcer of other part of left foot with necrosis of bone M86.172 Other acute osteomyelitis, left ankle and foot E11.42 Type 2 diabetes mellitus with diabetic polyneuropathy Facility Procedures CPT4 Code: [id number] Description: 9213 - WOUND CARE VISIT-LEV 3 EST PT Modifier: Quantity: 1 Physician Procedures : CPT4 Code Description Modifier [id number] - WC PHYS LEVEL 4 - EST PT ICD-10 Diagnosis Description L97.524 Non-pressure chronic ulcer of other part of left foot with necrosis of bone E11.621 Type 2 diabetes mellitus with foot ulcer Quantity: 1 Electronic Signature(s) Signed: 01/09/2021 5:25:56  PM By: Baltazar Najjar MD Entered By: Baltazar Najjar on 01/09/2021 16:16:36

## 2021-01-11 ENCOUNTER — Ambulatory Visit (INDEPENDENT_AMBULATORY_CARE_PROVIDER_SITE_OTHER): Payer: 59 | Admitting: Podiatry

## 2021-01-11 ENCOUNTER — Ambulatory Visit (INDEPENDENT_AMBULATORY_CARE_PROVIDER_SITE_OTHER): Payer: 59

## 2021-01-11 ENCOUNTER — Other Ambulatory Visit: Payer: Self-pay

## 2021-01-11 ENCOUNTER — Telehealth: Payer: Self-pay | Admitting: Urology

## 2021-01-11 DIAGNOSIS — E1149 Type 2 diabetes mellitus with other diabetic neurological complication: Secondary | ICD-10-CM

## 2021-01-11 DIAGNOSIS — L97524 Non-pressure chronic ulcer of other part of left foot with necrosis of bone: Secondary | ICD-10-CM

## 2021-01-11 DIAGNOSIS — E1165 Type 2 diabetes mellitus with hyperglycemia: Secondary | ICD-10-CM

## 2021-01-11 DIAGNOSIS — S91302A Unspecified open wound, left foot, initial encounter: Secondary | ICD-10-CM

## 2021-01-11 DIAGNOSIS — M86172 Other acute osteomyelitis, left ankle and foot: Secondary | ICD-10-CM

## 2021-01-11 DIAGNOSIS — IMO0002 Reserved for concepts with insufficient information to code with codable children: Secondary | ICD-10-CM

## 2021-01-11 NOTE — Patient Instructions (Signed)

## 2021-01-11 NOTE — Patient Instructions (Addendum)
DUE TO COVID-19 ONLY ONE VISITOR IS ALLOWED TO COME WITH YOU AND STAY IN THE WAITING ROOM ONLY DURING PRE OP AND PROCEDURE DAY OF SURGERY. THE 1 VISITOR  MAY VISIT WITH YOU AFTER SURGERY IN YOUR PRIVATE ROOM DURING VISITING HOURS ONLY!               [name]   Your procedure is scheduled on: 01/17/21   Report to [facility] Main  Entrance   Report to admitting at : 12:00 PM     Call this number if you have problems the morning of surgery [phone]    Remember: Do not eat solid food :After Midnight. Clear liquids until: 11:00 AM  CLEAR LIQUID DIET  Foods Allowed                                                                     Foods Excluded  Coffee and tea, regular and decaf                             liquids that you cannot  Plain Jell-O any favor except red or purple                                           see through such as: Fruit ices (not with fruit pulp)                                     milk, soups, orange juice  Iced Popsicles                                    All solid food Carbonated beverages, regular and diet                                    Cranberry, grape and apple juices Sports drinks like Gatorade Lightly seasoned clear broth or consume(fat free) Sugar, honey syrup  Sample Menu Breakfast                                Lunch                                     Supper Cranberry juice                    Beef broth                            Chicken broth Jell-O                                     Grape  juice                           Apple juice Coffee or tea                        Jell-O                                      Popsicle                                                Coffee or tea                        Coffee or tea  _____________________________________________________________________  BRUSH YOUR TEETH MORNING OF SURGERY AND RINSE YOUR MOUTH OUT, NO CHEWING GUM CANDY OR MINTS.   Take these medicines the morning of surgery  with A SIP OF WATER: N/A  How to Manage Your Diabetes Before and After Surgery  Why is it important to control my blood sugar before and after surgery? Improving blood sugar levels before and after surgery helps healing and can limit problems. A way of improving blood sugar control is eating a healthy diet by:  Eating less sugar and carbohydrates  Increasing activity/exercise  Talking with your doctor about reaching your blood sugar goals High blood sugars (greater than 180 mg/dL) can raise your risk of infections and slow your recovery, so you will need to focus on controlling your diabetes during the weeks before surgery. Make sure that the doctor who takes care of your diabetes knows about your planned surgery including the date and location.  How do I manage my blood sugar before surgery? Check your blood sugar at least 4 times a day, starting 2 days before surgery, to make sure that the level is not too high or low. Check your blood sugar the morning of your surgery when you wake up and every 2 hours until you get to the Short Stay unit. If your blood sugar is less than 70 mg/dL, you will need to treat for low blood sugar: Do not take insulin. Treat a low blood sugar (less than 70 mg/dL) with  cup of clear juice (cranberry or apple), 4 glucose tablets, OR glucose gel. Recheck blood sugar in 15 minutes after treatment (to make sure it is greater than 70 mg/dL). If your blood sugar is not greater than 70 mg/dL on recheck, call [phone] for further instructions. Report your blood sugar to the short stay nurse when you get to Short Stay.  If you are admitted to the hospital after surgery: Your blood sugar will be checked by the staff and you will probably be given insulin after surgery (instead of oral diabetes medicines) to make sure you have good blood sugar levels. The goal for blood sugar control after surgery is 80-180 mg/dL.   WHAT DO I DO ABOUT MY DIABETES MEDICATION?  Do  not take oral diabetes medicines (pills) the morning of surgery.  THE DAY BEFORE SURGERY, take Metformin as usual.       THE MORNING OF SURGERY, DO NOT TAKE ANY DIABETIC MEDICATIONS DAY OF YOUR SURGERY  You may not have any metal on your body including hair pins and              piercings  Do not wear jewelry, lotions, powders or perfumes, deodorant             Men may shave face and neck.   Do not bring valuables to the hospital. [facility] IS NOT             RESPONSIBLE   FOR VALUABLES.  Contacts, dentures or bridgework may not be worn into surgery.  Leave suitcase in the car. After surgery it may be brought to your room.     Patients discharged the day of surgery will not be allowed to drive home. IF YOU ARE HAVING SURGERY AND GOING HOME THE SAME DAY, YOU MUST HAVE AN ADULT TO DRIVE YOU HOME AND BE WITH YOU FOR 24 HOURS. YOU MAY GO HOME BY TAXI OR UBER OR ORTHERWISE, BUT AN ADULT MUST ACCOMPANY YOU HOME AND STAY WITH YOU FOR 24 HOURS.  Name and phone number of your driver:  Special Instructions: N/A              Please read over the following fact sheets you were given: _____________________________________________________________________           [facility] - Preparing for Surgery Before surgery, you can play an important role.  Because skin is not sterile, your skin needs to be as free of germs as possible.  You can reduce the number of germs on your skin by washing with CHG (chlorahexidine gluconate) soap before surgery.  CHG is an antiseptic cleaner which kills germs and bonds with the skin to continue killing germs even after washing. Please DO NOT use if you have an allergy to CHG or antibacterial soaps.  If your skin becomes reddened/irritated stop using the CHG and inform your nurse when you arrive at Short Stay. Do not shave (including legs and underarms) for at least 48 hours prior to the first CHG shower.  You may shave your face/neck. Please  follow these instructions carefully:  1.  Shower with CHG Soap the night before surgery and the  morning of Surgery.  2.  If you choose to wash your hair, wash your hair first as usual with your  normal  shampoo.  3.  After you shampoo, rinse your hair and body thoroughly to remove the  shampoo.                           4.  Use CHG as you would any other liquid soap.  You can apply chg directly  to the skin and wash                       Gently with a scrungie or clean washcloth.  5.  Apply the CHG Soap to your body ONLY FROM THE NECK DOWN.   Do not use on face/ open                           Wound or open sores. Avoid contact with eyes, ears mouth and genitals (private parts).                       Wash face,  Genitals (private parts) with your normal soap.  6.  Wash thoroughly, paying special attention to the area where your surgery  will be performed.  7.  Thoroughly rinse your body with warm water from the neck down.  8.  DO NOT shower/wash with your normal soap after using and rinsing off  the CHG Soap.                9.  Pat yourself dry with a clean towel.            10.  Wear clean pajamas.            11.  Place clean sheets on your bed the night of your first shower and do not  sleep with pets. Day of Surgery : Do not apply any lotions/deodorants the morning of surgery.  Please wear clean clothes to the hospital/surgery center.  FAILURE TO FOLLOW THESE INSTRUCTIONS MAY RESULT IN THE CANCELLATION OF YOUR SURGERY PATIENT SIGNATURE_________________________________  NURSE SIGNATURE__________________________________  ________________________________________________________________________

## 2021-01-11 NOTE — Progress Notes (Signed)
Pt. Needs orders for upcoming surgery.PAT on 01/12/21. 

## 2021-01-11 NOTE — Telephone Encounter (Signed)
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  REF # AVA [id number]

## 2021-01-12 ENCOUNTER — Other Ambulatory Visit: Payer: Self-pay

## 2021-01-12 ENCOUNTER — Encounter (HOSPITAL_COMMUNITY): Payer: Self-pay

## 2021-01-12 ENCOUNTER — Encounter (HOSPITAL_COMMUNITY)
Admission: RE | Admit: 2021-01-12 | Discharge: 2021-01-12 | Disposition: A | Discharge status: Home / Self Care | Payer: 59 | Source: Ambulatory Visit | Attending: Podiatry | Admitting: Podiatry

## 2021-01-12 DIAGNOSIS — Z01818 Encounter for other preprocedural examination: Secondary | ICD-10-CM | POA: Diagnosis present

## 2021-01-12 NOTE — Progress Notes (Signed)
RN verified with pt. about the need of H & P,as per pt. He's waiting for appointment ether today or Monday with PCP.

## 2021-01-12 NOTE — Progress Notes (Signed)
COVID Vaccine Completed: NO Date COVID Vaccine completed: COVID vaccine manufacturer: Pfizer    Moderna   [employer]   PCP - Dr. Joycelyn Rua Cardiologist -   Chest x-ray -  EKG -  Stress Test -  ECHO -  Cardiac Cath -  Pacemaker/ICD device last checked:  Sleep Study -  CPAP -   Fasting Blood Sugar - 130's Checks Blood Sugar ___3__ times a day  Blood Thinner Instructions: Aspirin Instructions: Last Dose:  Anesthesia review:   Patient denies shortness of breath, fever, cough and chest pain at PAT appointment   Patient verbalized understanding of instructions that were given to them at the PAT appointment. Patient was also instructed that they will need to review over the PAT instructions again at home before surgery.

## 2021-01-15 ENCOUNTER — Encounter (HOSPITAL_COMMUNITY)
Admission: RE | Admit: 2021-01-15 | Discharge: 2021-01-15 | Disposition: A | Discharge status: Home / Self Care | Payer: 59 | Source: Ambulatory Visit | Attending: Podiatry | Admitting: Podiatry

## 2021-01-15 DIAGNOSIS — Z01818 Encounter for other preprocedural examination: Secondary | ICD-10-CM | POA: Diagnosis not present

## 2021-01-15 LAB — HEMOGLOBIN A1C
Hgb A1c MFr Bld: 9.9 % — ABNORMAL HIGH (ref 4.8–5.6)
Mean Plasma Glucose: 237.43 mg/dL

## 2021-01-15 NOTE — Progress Notes (Addendum)
GER, RINGENBERG ([id number]) Visit Report for 01/09/2021 Arrival Information Details Patient Name: Date of Service: [name], [place] 01/09/2021 3:45 PM Medical Record Number: [id number] Patient Account Number: [id number] Date of Birth/Sex: Treating RN: [birth year] (65 y.o. [name] Primary Care Shaquasha Gerstel: Cynda Familia Other Clinician: Referring Michaeljoseph Revolorio: Treating Caliegh Middlekauff/Extender: Willia Craze in Treatment: 1 Visit Information History Since Last Visit All ordered tests and consults were completed: Yes Patient Arrived: Ambulatory Added or deleted any medications: No Arrival Time: 15:10 Any new allergies or adverse reactions: No Transfer Assistance: None Had a fall or experienced change in No Patient Identification Verified: Yes activities of daily living that may affect Secondary Verification Process Completed: Yes risk of falls: Patient Requires Transmission-Based Precautions: No Signs or symptoms of abuse/neglect since last visito No Patient Has Alerts: No Hospitalized since last visit: No Implantable device outside of the clinic excluding No cellular tissue based products placed in the center since last visit: Has Dressing in Place as Prescribed: Yes Pain Present Now: No Electronic Signature(s) Signed: 01/09/2021 5:30:58 PM By: Antonieta Iba Entered By: Antonieta Iba on 01/09/2021 15:14:00 -------------------------------------------------------------------------------- Clinic Level of Care Assessment Details Patient Name: Date of Service: [name], [name] 01/09/2021 3:45 PM Medical Record Number: [id number] Patient Account Number: [id number] Date of Birth/Sex: Treating RN: April 20, 1956 (65 y.o. [name], [name] Primary Care Klayton Monie: Cynda Familia Other Clinician: Referring Sheetal Lyall: Treating [name]/Extender: Willia Craze in Treatment: 1 Clinic Level of Care Assessment Items TOOL 4 Quantity Score X- 1  0 Use when only an EandM is performed on FOLLOW-UP visit ASSESSMENTS - Nursing Assessment / Reassessment X- 1 10 Reassessment of Co-morbidities (includes updates in patient status) X- 1 5 Reassessment of Adherence to Treatment Plan ASSESSMENTS - Wound and Skin A ssessment / Reassessment []  - 0 Simple Wound Assessment / Reassessment - one wound X- 1 5 Complex Wound Assessment / Reassessment - multiple wounds []  - 0 Dermatologic / Skin Assessment (not related to wound area) ASSESSMENTS - Focused Assessment X- 1 5 Circumferential Edema Measurements - multi extremities []  - 0 Nutritional Assessment / Counseling / Intervention []  - 0 Lower Extremity Assessment (monofilament, tuning fork, pulses) []  - 0 Peripheral Arterial Disease Assessment (using hand held doppler) ASSESSMENTS - Ostomy and/or Continence Assessment and Care []  - 0 Incontinence Assessment and Management []  - 0 Ostomy Care Assessment and Management (repouching, etc.) PROCESS - Coordination of Care X - Simple Patient / Family Education for ongoing care 1 15 []  - 0 Complex (extensive) Patient / Family Education for ongoing care X- 1 10 Staff obtains , Records, T Results / Process Orders est []  - 0 Staff telephones HHA, Nursing Homes / Clarify orders / etc []  - 0 Routine Transfer to another Facility (non-emergent condition) []  - 0 Routine Hospital Admission (non-emergent condition) []  - 0 New Admissions / / Ordering NPWT Apligraf, etc. , []  - 0 Emergency Hospital Admission (emergent condition) []  - 0 Simple Discharge Coordination X- 1 15 Complex (extensive) Discharge Coordination PROCESS - Special Needs []  - 0 Pediatric / Minor Patient Management []  - 0 Isolation Patient Management []  - 0 Hearing / Language / Visual special needs []  - 0 Assessment of Community assistance (transportation, D/C planning, etc.) []  - 0 Additional assistance / Altered mentation []  -  0 Support Surface(s) Assessment (bed, cushion, seat, etc.) INTERVENTIONS - Wound Cleansing / Measurement X - Simple Wound Cleansing - one wound 1 5 []  - 0 Complex Wound Cleansing -  multiple wounds X- 1 5 Wound Imaging (photographs - any number of wounds) []  - 0 Wound Tracing (instead of photographs) X- 1 5 Simple Wound Measurement - one wound []  - 0 Complex Wound Measurement - multiple wounds INTERVENTIONS - Wound Dressings X - Small Wound Dressing one or multiple wounds 1 10 []  - 0 Medium Wound Dressing one or multiple wounds []  - 0 Large Wound Dressing one or multiple wounds []  - 0 Application of Medications - topical []  - 0 Application of Medications - injection INTERVENTIONS - Miscellaneous []  - 0 External ear exam []  - 0 Specimen Collection (cultures, biopsies, blood, body fluids, etc.) []  - 0 Specimen(s) / Culture(s) sent or taken to Lab for analysis []  - 0 Patient Transfer (multiple staff / / Similar devices) []  - 0 Simple Staple / Suture removal (25 or less) []  - 0 Complex Staple / Suture removal (26 or more) []  - 0 Hypo / Hyperglycemic Management (close monitor of Blood Glucose) []  - 0 Ankle / Brachial Index (ABI) - do not check if billed separately X- 1 5 Vital Signs Has the patient been seen at the hospital within the last three years: Yes Total Score: 95 Level Of Care: New/Established - Level 3 Electronic Signature(s) Signed: 01/10/2021 6:12:45 PM By: RN Entered By: on 01/09/2021 15:40:22 -------------------------------------------------------------------------------- Encounter Discharge Information Details Patient Name: Date of Service: [name], [name] 01/09/2021 3:45 PM Medical Record Number: Patient Account Number: Date of Birth/Sex: Treating RN: [birth year] (65 y.o. Primary Care [name]: Other Clinician: Referring Twilla Khouri: Treating  [name]/Extender: in Treatment: 1 Encounter Discharge Information Items Discharge Condition: Stable Ambulatory Status: Ambulatory Discharge Destination: Home Transportation: Private Auto Schedule Follow-up Appointment: Yes Clinical Summary of Care: Provided on 01/09/2021 Form Type Recipient Paper Patient Patient Electronic Signature(s) Signed: 01/09/2021 4:35:54 PM By: Fonnie Mu Entered By: Fonnie Mu on 01/09/2021 16:35:54 -------------------------------------------------------------------------------- Lower Extremity Assessment Details Patient Name: Date of Service: [place], 03/12/2021 RK 01/09/2021 3:45 PM Medical Record Number: [id number] Patient Account Number: 10/28/1955 Date of Birth/Sex: Treating RN: Jan 05, 1956 (65 y.o. Cynda Familia Primary Care Herold Salguero: Willia Craze Other Clinician: Referring Reginia Battie: Treating Janzen Sacks/Extender: 03/12/2021 in Treatment: 1 Edema Assessment Assessed: 03/12/2021: Yes] Antonieta Iba: No] Edema: [Left: N] [Right: o] Calf Left: Right: Point of Measurement: 31 cm From Medial Instep 39 cm Ankle Left: Right: Point of Measurement: 10 cm From Medial Instep 24 cm Vascular Assessment Pulses: Dorsalis Pedis Palpable: [Left:Yes] Electronic Signature(s) Signed: 01/09/2021 5:30:58 PM By: 03/12/2021 Entered By: Sullivan on 01/09/2021 15:20:42 -------------------------------------------------------------------------------- Multi Wound Chart Details Patient Name: Date of Service: 03/12/2021, [name] 01/09/2021 3:45 PM Medical Record Number: [id number] Patient Account Number: 10/28/1955 Date of Birth/Sex: Treating RN: 10/12/1955 (65 y.o. Cynda Familia, [name] Primary Care Briggs Edelen: Willia Craze Other Clinician: Referring Shamon Cothran: Treating Omkar Stratmann/Extender: Kyra Searles in Treatment: 1 Vital Signs Height(in): Pulse(bpm):  99 Weight(lbs): Blood Pressure(mmHg): 134/76 Body Mass Index(BMI): Temperature(F): 98.2 Respiratory Rate(breaths/min): 18 Photos: [5:No Photos Left T Great oe] [N/A:N/A N/A] Wound Location: [5:Trauma] [N/A:N/A] Wounding Event: [5:Diabetic Wound/Ulcer of the Lower] [N/A:N/A] Primary Etiology: [5:Extremity Cataracts, Hypertension, Type II] [N/A:N/A] Comorbid History: [5:Diabetes, Neuropathy, Confinement Anxiety 12/24/2020] [N/A:N/A] Date Acquired: [5:1] [N/A:N/A] Weeks of Treatment: [5:Open] [N/A:N/A] Wound Status: [5:1.7x2x1] [N/A:N/A] Measurements L x W x D (cm) [5:2.67] [N/A:N/A] A (cm) : rea [5:2.67] [N/A:N/A] Volume (cm) : [5:-13.30%] [N/A:N/A] % Reduction  in A rea: [5:5.60%] [N/A:N/A] % Reduction in Volume: [5:9] Position 1 (o'clock): [5:0.9] Maximum Distance 1 (cm): [5:Yes] [N/A:N/A] Tunneling: [5:Grade 1] [N/A:N/A] Classification: [5:Medium] [N/A:N/A] Exudate A mount: [5:Serosanguineous] [N/A:N/A] Exudate Type: [5:red, brown] [N/A:N/A] Exudate Color: [5:Distinct, outline attached] [N/A:N/A] Wound Margin: [5:Large (67-100%)] [N/A:N/A] Granulation A mount: [5:Red, Pink] [N/A:N/A] Granulation Quality: [5:None Present (0%)] [N/A:N/A] Necrotic A mount: [5:Fat Layer (Subcutaneous Tissue): Yes N/A] Exposed Structures: [5:Fascia: No Tendon: No Muscle: No Joint: No Bone: No None] [N/A:N/A] Treatment Notes Electronic Signature(s) Signed: 01/09/2021 5:25:56 PM By: Baltazar Najjar MD Signed: 01/10/2021 6:12:45 PM By: Fonnie Mu RN Signed: 01/10/2021 6:12:45 PM By: Fonnie Mu RN Entered By: Baltazar Najjar on 01/09/2021 16:11:27 -------------------------------------------------------------------------------- Multi-Disciplinary Care Plan Details Patient Name: Date of Service: [place], [name] 01/09/2021 3:45 PM Medical Record Number: [id number] Patient Account Number: [id number] Date of Birth/Sex: Treating RN: [birth year] (65 y.o. [name], [name] Primary Care  Arlon Bleier: Cynda Familia Other Clinician: Referring Rusell Meneely: Treating Cahterine Heinzel/Extender: Willia Craze in Treatment: 1 Active Inactive Electronic Signature(s) Signed: 03/29/2021 4:51:27 PM By: Zandra Abts RN, BSN Signed: 07/19/2021 12:40:52 PM By: Fonnie Mu RN Previous Signature: 01/10/2021 6:12:45 PM Version By: Fonnie Mu RN Entered By: Zandra Abts on 02/05/2021 14:29:01 -------------------------------------------------------------------------------- Pain Assessment Details Patient Name: Date of Service: [place], [name] 01/09/2021 3:45 PM Medical Record Number: [id number] Patient Account Number: [id number] Date of Birth/Sex: Treating RN: [birth year] (65 y.o. [name] Primary Care Gabriana Wilmott: Cynda Familia Other Clinician: Referring Brodie Correll: Treating Etan Vasudevan/Extender: Willia Craze in Treatment: 1 Active Problems Location of Pain Severity and Description of Pain Patient Has Paino No Site Locations Pain Management and Medication Current Pain Management: Electronic Signature(s) Signed: 01/09/2021 5:30:58 PM By: Antonieta Iba Entered By: Antonieta Iba on 01/09/2021 15:14:24 -------------------------------------------------------------------------------- Patient/Caregiver Education Details Patient Name: Date of Service: Lorrene Reid RK 7/12/2022andnbsp3:45 PM Medical Record Number: [id number] Patient Account Number: [id number] Date of Birth/Gender: Treating RN: 05/25/1956 (65 y.o. [name], [name] Primary Care Physician: Cynda Familia Other Clinician: Referring Physician: Treating Physician/Extender: Willia Craze in Treatment: 1 Education Assessment Education Provided To: Patient Education Topics Provided Elevated Blood Sugar/ Impact on Healing: Methods: Explain/Verbal Responses: State content correctly Wound/Skin Impairment: Methods:  Explain/Verbal Responses: State content correctly Electronic Signature(s) Signed: 01/10/2021 6:12:45 PM By: Fonnie Mu RN Entered By: Fonnie Mu on 01/09/2021 15:38:06 -------------------------------------------------------------------------------- Wound Assessment Details Patient Name: Date of Service: Todd Price, [name] 01/09/2021 3:45 PM Medical Record Number: [id number] Patient Account Number: [id number] Date of Birth/Sex: Treating RN: Oct 29, 1955 (65 y.o. [name] Primary Care Aurielle Slingerland: Cynda Familia Other Clinician: Referring Raiquan Chandler: Treating Shailee Foots/Extender: Willia Craze in Treatment: 1 Wound Status Wound Number: 5 Primary Diabetic Wound/Ulcer of the Lower Extremity Etiology: Wound Location: Left T Great oe Wound Status: Open Wounding Event: Trauma Comorbid Cataracts, Hypertension, Type II Diabetes, Neuropathy, Date Acquired: 12/24/2020 History: Confinement Anxiety Weeks Of Treatment: 1 Clustered Wound: No Photos Wound Measurements Length: (cm) 1.7 Width: (cm) 2 Depth: (cm) 1 Area: (cm) 2.67 Volume: (cm) 2.67 % Reduction in Area: -13.3% % Reduction in Volume: 5.6% Epithelialization: None Tunneling: Yes Position (o'clock): 9 Maximum Distance: (cm) 0.9 Undermining: No Wound Description Classification: Grade 1 Wound Margin: Distinct, outline attached Exudate Amount: Medium Exudate Type: Serosanguineous Exudate Color: red, brown Foul Odor After Cleansing: No Slough/Fibrino No Wound Bed Granulation Amount: Large (67-100%) Exposed Structure Granulation Quality: Red, Pink Fascia Exposed: No Necrotic Amount: None Present (0%) Fat Layer (Subcutaneous Tissue)  Exposed: Yes Tendon Exposed: No Muscle Exposed: No Joint Exposed: No Bone Exposed: No Electronic Signature(s) Signed: 01/09/2021 5:30:58 PM By: Antonieta IbaBarnhart, Jodi Signed: 01/15/2021 3:49:49 PM By: Karl Itoawkins, Destiny Entered By: Karl Itoawkins, Destiny on  01/09/2021 16:21:27 -------------------------------------------------------------------------------- Vitals Details Patient Name: Date of Service: Todd PriceUGGINS, [name] 01/09/2021 3:45 PM Medical Record Number: [id number] Patient Account Number: [id number] Date of Birth/Sex: Treating RN: Feb 10, 1956 (65 y.o. Todd MichaelsM) Barnhart, Jodi Primary Care Tegan Britain: Cynda FamiliaMeyers, Stephen C Other Clinician: Referring Brynnlee Cumpian: Treating Ayad Nieman/Extender: Willia Crazeobson, Michael Meyers, Stephen C Weeks in Treatment: 1 Vital Signs Time Taken: 15:14 Temperature (F): 98.2 Pulse (bpm): 99 Respiratory Rate (breaths/min): 18 Blood Pressure (mmHg): 134/76 Reference Range: 80 - 120 mg / dl Electronic Signature(s) Signed: 01/09/2021 5:30:58 PM By: Antonieta IbaBarnhart, Jodi Entered By: Antonieta IbaBarnhart, Jodi on 01/09/2021 15:14:18

## 2021-01-16 ENCOUNTER — Encounter (HOSPITAL_BASED_OUTPATIENT_CLINIC_OR_DEPARTMENT_OTHER): Payer: 59 | Admitting: Internal Medicine

## 2021-01-16 NOTE — Progress Notes (Signed)
Subjective:   Patient ID: [name], male   DOB: 65 y.o.   MRN: [id number]   HPI 65 year old male presents For Surgical Consultation Given Wound to Left Big Toe.  He States That It Was Deformed and [facility].  He Had an MRI Performed Last Thursday Which Revealed Osteomyelitis.  He Has Noticed Bleeding and Drainage to the Toe.  No Purulence at This Time.  He Is Diabetic.  He currently denies any fevers or chills.  No nausea or vomiting.  Review of Systems  All other systems reviewed and are negative.  Past Medical History:  Diagnosis Date   Diabetes mellitus without complication (HCC)    Hypertension     Past Surgical History:  Procedure Laterality Date   Arthroscopic knee surgery     TONSILLECTOMY       Current Outpatient Medications:    amoxicillin-clavulanate (AUGMENTIN) 875-125 MG tablet, Take 1 tablet by mouth 2 (two) times daily., Disp: , Rfl:    aspirin EC 81 MG tablet, Take 1 tablet (81 mg total) by mouth daily. Swallow whole., Disp: 90 tablet, Rfl: 3   lisinopril (ZESTRIL) 40 MG tablet, Take 40 mg by mouth daily., Disp: , Rfl:    metFORMIN (GLUCOPHAGE-XR) 500 MG 24 hr tablet, Take 1,000 mg by mouth 2 (two) times daily., Disp: , Rfl:    metoprolol tartrate (LOPRESSOR) 100 MG tablet, TAKE 2 HOURS PRIOR TO CT SCAN (Patient not taking: No sig reported), Disp: 1 tablet, Rfl: 0   rosuvastatin (CRESTOR) 40 MG tablet, Take 1 tablet (40 mg total) by mouth daily. (Patient not taking: Reported on 01/11/2021), Disp: 90 tablet, Rfl: 3  No Known Allergies        Objective:  Physical Exam  General: AAO x3, NAD  Dermatological: Full-thickness ulceration of the left hallux toenail x2.  There is probing to bone at the distal aspect of the toe.  There is localized edema to the most of the distal portion of the hallux with faint erythema but no ascending cellulitis.  There is no fluctuation crepitation.  There is no malodor.  Vascular: Dorsalis Pedis artery  and Posterior Tibial artery pedal pulses are 2/4 bilateral with immedate capillary fill time. There is no pain with calf compression, swelling, warmth, erythema.   Neruologic: Sensation decreased.  Musculoskeletal: Muscular strength 5/5 in all groups tested bilateral.  Gait: Unassisted, Nonantalgic.       Assessment:   Osteomyelitis left hallux     Plan:  -Treatment options discussed including all alternatives, risks, and complications -Etiology of symptoms were discussed -X-rays were obtained and reviewed with the patient.  There is lucency present the distal portion of the hallux consistent with osteomyelitis.  Also significantly reviewed the MRI which showed soft tissue ulceration of the distal hallux with bone changes consistent with osteomyelitis. -We discussed surgery as well as conservative treatment.  My recommendation is to proceed with amputation.  Total versus partial hallux.  We discussed with the limb salvage including long-term antibiotics.  After discussion he agrees to proceed with amputation. -The incision placement as well as the postoperative course was discussed with the patient. I discussed risks of the surgery which include, but not limited to, infection, bleeding, pain, swelling, need for further surgery, delayed or nonhealing, painful or ugly scar, numbness or sensation changes, over/under correction, recurrence, transfer lesions, further deformity, DVT/PE, loss of toe/foot. Patient understands these risks and wishes to proceed with surgery. The surgical consent was reviewed with  the patient all 3 pages were signed. No promises or guarantees were given to the outcome of the procedure. All questions were answered to the best of my ability. Before the surgery the patient was encouraged to call the office if there is any further questions. The surgery will be performed at [facility] on an outpatient basis. -We will plan for amputation next week however in the  meantime if symptoms were to worsen he is to report to emergency department.    Vivi Barrack DPM

## 2021-01-16 NOTE — Progress Notes (Signed)
Lab. Results: A1C: 9.9

## 2021-01-16 NOTE — Progress Notes (Signed)
Anesthesia Chart Review   Case: [id number] Date/Time: 01/17/21 1345   Procedure: AMPUTATION TOE INTERPHANGEAL LEFT FOOT (Left: Toe)   Anesthesia type: Choice   Pre-op diagnosis: ULCER LEFT FOOT   Location: [facility] ROOM 01 / [facility]   Surgeons: Vivi Barrack, DPM       DISCUSSION:65 y.o. never smoker with h/o HTN, DM II (A1C 9.9), ulcer left foot scheduled for above procedure 01/17/2021 with Ovid Curd, DPM.   Pt last seen by his PCP 01/12/2021 for preoperative evaluation.  Per OV note, "Of note, the patient's blood pressure is elevated today, and is generally elevated. Patient is adamant that he does not want to change his medication regimens as he feels his pressure is a result of increased stress due to his father's death." Per PCP pt can proceed with surgery without additional testing.   Evaluate CBG DOS.  VS: There were no vitals taken for this visit.  PROVIDERS: Joycelyn Rua, MD is PCP    LABS: Labs reviewed: Acceptable for surgery. (all labs ordered are listed, but only abnormal results are displayed)  Labs Reviewed - No data to display   IMAGES:   EKG: 01/12/2021 Rate 99 bpm  NSR  CV:  Past Medical History:  Diagnosis Date   Diabetes mellitus without complication (HCC)    Hypertension     Past Surgical History:  Procedure Laterality Date   Arthroscopic knee surgery     TONSILLECTOMY      MEDICATIONS: No current facility-administered medications for this encounter.    amoxicillin-clavulanate (AUGMENTIN) 875-125 MG tablet   aspirin EC 81 MG tablet   lisinopril (ZESTRIL) 40 MG tablet   metFORMIN (GLUCOPHAGE-XR) 500 MG 24 hr tablet   metoprolol tartrate (LOPRESSOR) 100 MG tablet   rosuvastatin (CRESTOR) 40 MG tablet    Jodell Cipro, PA-C [facility] Pre-Surgical Testing [phone]

## 2021-01-17 ENCOUNTER — Encounter (HOSPITAL_COMMUNITY): Payer: Self-pay | Admitting: Podiatry

## 2021-01-17 ENCOUNTER — Other Ambulatory Visit: Payer: Self-pay | Admitting: Podiatry

## 2021-01-17 ENCOUNTER — Ambulatory Visit (HOSPITAL_COMMUNITY)
Admission: RE | Admit: 2021-01-17 | Discharge: 2021-01-17 | Disposition: A | Discharge status: Home / Self Care | Payer: 59 | Attending: Podiatry | Admitting: Podiatry

## 2021-01-17 ENCOUNTER — Encounter: Payer: Self-pay | Admitting: Podiatry

## 2021-01-17 ENCOUNTER — Ambulatory Visit (HOSPITAL_COMMUNITY): Payer: 59 | Admitting: Physician Assistant

## 2021-01-17 ENCOUNTER — Encounter (HOSPITAL_COMMUNITY)
Admission: RE | Disposition: A | Discharge status: Home / Self Care | Payer: Self-pay | Source: Home / Self Care | Attending: Podiatry

## 2021-01-17 ENCOUNTER — Ambulatory Visit (HOSPITAL_COMMUNITY): Payer: 59

## 2021-01-17 DIAGNOSIS — E1169 Type 2 diabetes mellitus with other specified complication: Secondary | ICD-10-CM | POA: Diagnosis present

## 2021-01-17 DIAGNOSIS — L97529 Non-pressure chronic ulcer of other part of left foot with unspecified severity: Secondary | ICD-10-CM | POA: Insufficient documentation

## 2021-01-17 DIAGNOSIS — M86672 Other chronic osteomyelitis, left ankle and foot: Secondary | ICD-10-CM | POA: Diagnosis not present

## 2021-01-17 DIAGNOSIS — M869 Osteomyelitis, unspecified: Secondary | ICD-10-CM | POA: Insufficient documentation

## 2021-01-17 DIAGNOSIS — Z9889 Other specified postprocedural states: Secondary | ICD-10-CM

## 2021-01-17 DIAGNOSIS — I10 Essential (primary) hypertension: Secondary | ICD-10-CM | POA: Insufficient documentation

## 2021-01-17 HISTORY — PX: AMPUTATION TOE: SHX6595

## 2021-01-17 LAB — GLUCOSE, CAPILLARY
Glucose-Capillary: 174 mg/dL — ABNORMAL HIGH (ref 70–99)
Glucose-Capillary: 166 mg/dL — ABNORMAL HIGH (ref 70–99)

## 2021-01-17 SURGERY — AMPUTATION, TOE
Anesthesia: General | Site: Toe | Laterality: Left

## 2021-01-17 MED ORDER — LIDOCAINE 2% (20 MG/ML) 5 ML SYRINGE
INTRAMUSCULAR | Status: AC
  Filled 2021-01-17: qty 10

## 2021-01-17 MED ORDER — CHLORHEXIDINE GLUCONATE CLOTH 2 % EX PADS: 6.0000 | MEDICATED_PAD | Freq: Once | CUTANEOUS | Status: DC

## 2021-01-17 MED ORDER — ONDANSETRON HCL 4 MG/2ML IJ SOLN: 4.0000 mg | Freq: Once | INTRAMUSCULAR | Status: DC | PRN

## 2021-01-17 MED ORDER — OXYCODONE HCL 5 MG/5ML PO SOLN: 5.0000 mg | Freq: Once | ORAL | Status: DC | PRN

## 2021-01-17 MED ORDER — FENTANYL CITRATE (PF) 100 MCG/2ML IJ SOLN: 25.0000 ug | INTRAMUSCULAR | Status: DC | PRN

## 2021-01-17 MED ORDER — 0.9 % SODIUM CHLORIDE (POUR BTL) OPTIME
TOPICAL | Status: DC | PRN
  Administered 2021-01-17: 1000 mL

## 2021-01-17 MED ORDER — MIDAZOLAM HCL 2 MG/2ML IJ SOLN
INTRAMUSCULAR | Status: AC
  Filled 2021-01-17: qty 2

## 2021-01-17 MED ORDER — CHLORHEXIDINE GLUCONATE 0.12 % MT SOLN: 15.0000 mL | Freq: Once | OROMUCOSAL | Status: DC

## 2021-01-17 MED ORDER — BUPIVACAINE HCL (PF) 0.5 % IJ SOLN
INTRAMUSCULAR | Status: AC
  Filled 2021-01-17: qty 30

## 2021-01-17 MED ORDER — MIDAZOLAM HCL 5 MG/5ML IJ SOLN
INTRAMUSCULAR | Status: DC | PRN
  Administered 2021-01-17: 1 mg via INTRAVENOUS

## 2021-01-17 MED ORDER — HYDROCODONE-ACETAMINOPHEN 5-325 MG PO TABS: 1.0000 | ORAL_TABLET | Freq: Four times a day (QID) | ORAL | 0 refills | Status: AC | PRN

## 2021-01-17 MED ORDER — FENTANYL CITRATE (PF) 100 MCG/2ML IJ SOLN
INTRAMUSCULAR | Status: DC | PRN
  Administered 2021-01-17: 50 ug via INTRAVENOUS

## 2021-01-17 MED ORDER — VANCOMYCIN HCL IN DEXTROSE 1-5 GM/200ML-% IV SOLN
1000.0000 mg | INTRAVENOUS | Status: AC
  Administered 2021-01-17: 1000 mg via INTRAVENOUS
  Filled 2021-01-17: qty 200

## 2021-01-17 MED ORDER — PROPOFOL 10 MG/ML IV BOLUS
INTRAVENOUS | Status: AC
  Filled 2021-01-17: qty 20

## 2021-01-17 MED ORDER — ORAL CARE MOUTH RINSE: 15.0000 mL | Freq: Once | OROMUCOSAL | Status: DC

## 2021-01-17 MED ORDER — AMOXICILLIN-POT CLAVULANATE 875-125 MG PO TABS: 1.0000 | ORAL_TABLET | Freq: Two times a day (BID) | ORAL | 0 refills | Status: AC

## 2021-01-17 MED ORDER — FENTANYL CITRATE (PF) 100 MCG/2ML IJ SOLN
INTRAMUSCULAR | Status: AC
  Filled 2021-01-17: qty 2

## 2021-01-17 MED ORDER — LACTATED RINGERS IV SOLN: INTRAVENOUS | Status: DC

## 2021-01-17 MED ORDER — PROPOFOL 500 MG/50ML IV EMUL
INTRAVENOUS | Status: DC | PRN
  Administered 2021-01-17: 100 ug/kg/min via INTRAVENOUS

## 2021-01-17 MED ORDER — OXYCODONE HCL 5 MG PO TABS: 5.0000 mg | ORAL_TABLET | Freq: Once | ORAL | Status: DC | PRN

## 2021-01-17 MED ORDER — LIDOCAINE HCL (PF) 1 % IJ SOLN
INTRAMUSCULAR | Status: AC
  Filled 2021-01-17: qty 30

## 2021-01-17 MED ORDER — LIDOCAINE HCL 1 % IJ SOLN
INTRAMUSCULAR | Status: DC | PRN
  Administered 2021-01-17: 5 mL

## 2021-01-17 MED ORDER — BUPIVACAINE HCL (PF) 0.25 % IJ SOLN
INTRAMUSCULAR | Status: DC | PRN
  Administered 2021-01-17: 5 mL

## 2021-01-17 MED ORDER — PROPOFOL 500 MG/50ML IV EMUL
INTRAVENOUS | Status: AC
  Filled 2021-01-17: qty 50

## 2021-01-17 SURGICAL SUPPLY — 38 items
APL PRP STRL LF DISP 70% ISPRP (MISCELLANEOUS) ×1
BAG COUNTER SPONGE SURGICOUNT (BAG) IMPLANT
BAG SPNG CNTER NS LX DISP (BAG)
BLADE HEX COATED 2.75 (ELECTRODE) ×2 IMPLANT
BLADE OSCILLATING/SAGITTAL (BLADE) ×2
BLADE SW THK.38XMED LNG THN (BLADE) ×1 IMPLANT
BNDG CMPR 9X4 STRL LF SNTH (GAUZE/BANDAGES/DRESSINGS) ×1
BNDG CMPR MED 10X6 ELC LF (GAUZE/BANDAGES/DRESSINGS) ×1
BNDG CONFORM 2 STRL LF (GAUZE/BANDAGES/DRESSINGS) ×2 IMPLANT
BNDG ELASTIC 3X5.8 VLCR STR LF (GAUZE/BANDAGES/DRESSINGS) ×2 IMPLANT
BNDG ELASTIC 6X10 VLCR STRL LF (GAUZE/BANDAGES/DRESSINGS) ×1 IMPLANT
BNDG ESMARK 4X9 LF (GAUZE/BANDAGES/DRESSINGS) ×2 IMPLANT
BNDG GAUZE ELAST 4 BULKY (GAUZE/BANDAGES/DRESSINGS) ×2 IMPLANT
CHLORAPREP W/TINT 26 (MISCELLANEOUS) ×2 IMPLANT
DRSG PAD ABDOMINAL 8X10 ST (GAUZE/BANDAGES/DRESSINGS) ×1 IMPLANT
ELECT REM PT RETURN 15FT ADLT (MISCELLANEOUS) ×2 IMPLANT
GAUZE SPONGE 4X4 12PLY STRL (GAUZE/BANDAGES/DRESSINGS) ×2 IMPLANT
GAUZE XEROFORM 1X8 LF (GAUZE/BANDAGES/DRESSINGS) ×1 IMPLANT
GLOVE SURG ENC MOIS LTX SZ7.5 (GLOVE) ×4 IMPLANT
GLOVE SURG UNDER POLY LF SZ7.5 (GLOVE) ×2 IMPLANT
GOWN STRL REUS W/ TWL LRG LVL3 (GOWN DISPOSABLE) ×1 IMPLANT
GOWN STRL REUS W/ TWL XL LVL3 (GOWN DISPOSABLE) ×1 IMPLANT
GOWN STRL REUS W/TWL LRG LVL3 (GOWN DISPOSABLE) ×2
GOWN STRL REUS W/TWL XL LVL3 (GOWN DISPOSABLE) ×2
KIT BASIN OR (CUSTOM PROCEDURE TRAY) ×2 IMPLANT
KIT TURNOVER KIT A (KITS) ×2 IMPLANT
NDL HYPO 25X1 1.5 SAFETY (NEEDLE) ×1 IMPLANT
NDL SAFETY ECLIPSE 18X1.5 (NEEDLE) IMPLANT
NEEDLE HYPO 18GX1.5 SHARP (NEEDLE)
NEEDLE HYPO 25X1 1.5 SAFETY (NEEDLE) ×2 IMPLANT
NS IRRIG 1000ML POUR BTL (IV SOLUTION) IMPLANT
PACK ORTHO EXTREMITY (CUSTOM PROCEDURE TRAY) IMPLANT
PENCIL SMOKE EVACUATOR (MISCELLANEOUS) IMPLANT
SUT ETHILON 3 0 PS 1 (SUTURE) IMPLANT
SUT MNCRL AB 3-0 PS2 18 (SUTURE) ×2 IMPLANT
SUT PROLENE 2 0 SH DA (SUTURE) IMPLANT
SYR 20ML LL LF (SYRINGE) ×2 IMPLANT
UNDERPAD 30X36 HEAVY ABSORB (UNDERPADS AND DIAPERS) ×2 IMPLANT

## 2021-01-17 NOTE — Transfer of Care (Signed)
Immediate Anesthesia Transfer of Care Note  Patient: [name]  Procedure(s) Performed: AMPUTATION TOE INTERPHANGEAL LEFT FOOT (Left: Toe)  Patient Location: PACU  Anesthesia Type:MAC  Level of Consciousness: drowsy and responds to stimulation  Airway & Oxygen Therapy: Patient Spontanous Breathing and Patient connected to face mask oxygen  Post-op Assessment: Report given to RN and Post -op Vital signs reviewed and stable  Post vital signs: Reviewed and stable  Last Vitals:  Vitals Value Taken Time  BP    Temp    Pulse 80 01/17/21 1450  Resp 17 01/17/21 1450  SpO2 99 % 01/17/21 1450  Vitals shown include unvalidated device data.  Last Pain:  Vitals:   01/17/21 1300  TempSrc:   PainSc: 0-No pain      Patients Stated Pain Goal: 3 (58/25/18 9842)  Complications: No notable events documented.

## 2021-01-17 NOTE — Anesthesia Preprocedure Evaluation (Signed)
Anesthesia Evaluation  Patient identified by MRN, date of birth, ID band Patient awake    Reviewed: Allergy & Precautions, NPO status , Patient's Chart, lab work & pertinent test results  Airway Mallampati: II  TM Distance: >3 FB Neck ROM: Full    Dental no notable dental hx.    Pulmonary neg pulmonary ROS,    Pulmonary exam normal breath sounds clear to auscultation       Cardiovascular hypertension, Pt. on medications and Pt. on home beta blockers Normal cardiovascular exam Rhythm:Regular Rate:Normal     Neuro/Psych negative neurological ROS  negative psych ROS   GI/Hepatic negative GI ROS, Neg liver ROS,   Endo/Other  diabetes  Renal/GU negative Renal ROS  negative genitourinary   Musculoskeletal negative musculoskeletal ROS (+)   Abdominal   Peds negative pediatric ROS (+)  Hematology negative hematology ROS (+)   Anesthesia Other Findings   Reproductive/Obstetrics negative OB ROS                             Anesthesia Physical Anesthesia Plan  ASA: 2  Anesthesia Plan: MAC   Post-op Pain Management:    Induction: Intravenous  PONV Risk Score and Plan: 1 and Propofol infusion and Treatment may vary due to age or medical condition  Airway Management Planned: Simple Face Mask  Additional Equipment:   Intra-op Plan:   Post-operative Plan:   Informed Consent: I have reviewed the patients History and Physical, chart, labs and discussed the procedure including the risks, benefits and alternatives for the proposed anesthesia with the patient or authorized representative who has indicated his/her understanding and acceptance.     Dental advisory given  Plan Discussed with: CRNA and Surgeon  Anesthesia Plan Comments:         Anesthesia Quick Evaluation

## 2021-01-17 NOTE — Progress Notes (Signed)
error 

## 2021-01-17 NOTE — Anesthesia Procedure Notes (Signed)
Procedure Name: MAC Date/Time: 01/17/2021 2:10 PM Performed by: Lollie Sails, CRNA Pre-anesthesia Checklist: Patient identified, Emergency Drugs available, Suction available, Patient being monitored and Timeout performed Oxygen Delivery Method: Simple face mask Placement Confirmation: positive ETCO2

## 2021-01-17 NOTE — Progress Notes (Signed)
Orthopedic Tech Progress Note Patient Details:  [name] 04/25/1956 [id number]  Patient ID: [name], male   DOB: [birth year], 65 y.o.   MRN: [id number]  [name] 01/17/2021, 3:36 PM Cam boot applied to left leg in pacu. Crutches sized and given to patient

## 2021-01-17 NOTE — Brief Op Note (Signed)
01/17/2021  2:44 PM  PATIENT:  [name]  65 y.o. male  PRE-OPERATIVE DIAGNOSIS:  ULCER LEFT FOOT, OSTEOMYELITIS  POST-OPERATIVE DIAGNOSIS:  ULCER LEFT FOOT, OSTEOMYELITIS   PROCEDURE:  Procedure(s): AMPUTATION TOE INTERPHANGEAL LEFT FOOT (Left)  SURGEON:  Surgeon(s) and Role:    * Trula Slade, DPM - Primary  PHYSICIAN ASSISTANT:   ASSISTANTS: none   ANESTHESIA:   MAC  EBL:  25 mL   BLOOD ADMINISTERED:none  DRAINS: none   LOCAL MEDICATIONS USED:  MARCAINE    and XYLOCAINE   SPECIMEN:  Source of Specimen:  TOE FOR PATHOLOGY   DISPOSITION OF SPECIMEN:  PATHOLOGY  COUNTS:  YES  TOURNIQUET:  * Missing tourniquet times found for documented tourniquets in log: [id number] *  DICTATION: .Dragon Dictation  PLAN OF CARE: Discharge to home after PACU  PATIENT DISPOSITION:  PACU - hemodynamically stable.   Delay start of Pharmacological VTE agent (>24hrs) due to surgical blood loss or risk of bleeding: no  Intraoperative findings: Hallux amputation given osteomyelitis of the distal phalanx with soft tissue infection to distal 1st MTPJ. Remaining metatarsal bone appears viable. No purulence or proximal infection noted. Hemostasis achieved, irrigated and closed without tension.

## 2021-01-17 NOTE — H&P (Signed)
Patient seen in pre-op.Scheduled for left toe amputation, total versus partial. Upon evaluation today there is erythema extending to just distal the 1st MTPJ. No ascending cellulitis. There is no purulence. Due to this discussed with him total toe amputation and he understands. We discussed the surgery and post-op course. Discussed risks and complications. Consent signed and site verified. Will proceed with surgery.   Ovid Curd, DPM

## 2021-01-17 NOTE — Anesthesia Preprocedure Evaluation (Deleted)
Anesthesia Evaluation  Patient identified by MRN, date of birth, ID band Patient awake    Reviewed: Allergy & Precautions, NPO status , Patient's Chart, lab work & pertinent test results  Airway Mallampati: II  TM Distance: >3 FB Neck ROM: Full    Dental no notable dental hx.    Pulmonary neg pulmonary ROS,    Pulmonary exam normal breath sounds clear to auscultation       Cardiovascular hypertension, Pt. on medications and Pt. on home beta blockers Normal cardiovascular exam Rhythm:Regular Rate:Normal     Neuro/Psych negative neurological ROS  negative psych ROS   GI/Hepatic negative GI ROS, Neg liver ROS,   Endo/Other  diabetes  Renal/GU negative Renal ROS  negative genitourinary   Musculoskeletal negative musculoskeletal ROS (+)   Abdominal   Peds negative pediatric ROS (+)  Hematology negative hematology ROS (+)   Anesthesia Other Findings   Reproductive/Obstetrics negative OB ROS                             Anesthesia Physical Anesthesia Plan  ASA: 2  Anesthesia Plan: General   Post-op Pain Management:    Induction: Intravenous  PONV Risk Score and Plan: 2 and Ondansetron and Treatment may vary due to age or medical condition  Airway Management Planned: LMA  Additional Equipment:   Intra-op Plan:   Post-operative Plan: Extubation in OR  Informed Consent: I have reviewed the patients History and Physical, chart, labs and discussed the procedure including the risks, benefits and alternatives for the proposed anesthesia with the patient or authorized representative who has indicated his/her understanding and acceptance.     Dental advisory given  Plan Discussed with: CRNA and Surgeon  Anesthesia Plan Comments:         Anesthesia Quick Evaluation

## 2021-01-18 ENCOUNTER — Telehealth: Payer: Self-pay | Admitting: *Deleted

## 2021-01-18 ENCOUNTER — Encounter (HOSPITAL_COMMUNITY): Payer: Self-pay | Admitting: Podiatry

## 2021-01-18 NOTE — Anesthesia Postprocedure Evaluation (Signed)
Anesthesia Post Note  Patient: [name]  Procedure(s) Performed: AMPUTATION TOE INTERPHANGEAL LEFT FOOT (Left: Toe)     Patient location during evaluation: PACU Anesthesia Type: MAC Level of consciousness: awake and alert Pain management: pain level controlled Vital Signs Assessment: post-procedure vital signs reviewed and stable Respiratory status: spontaneous breathing, nonlabored ventilation, respiratory function stable and patient connected to nasal cannula oxygen Cardiovascular status: stable and blood pressure returned to baseline Postop Assessment: no apparent nausea or vomiting Anesthetic complications: no   No notable events documented.  Last Vitals:  Vitals:   01/17/21 1530 01/17/21 1547  BP: 133/80 (!) 141/87  Pulse: 73   Resp: (!) 23 16  Temp:  (!) 36.4 C  SpO2: 93% 97%    Last Pain:  Vitals:   01/17/21 1547  TempSrc:   PainSc: 0-No pain                 Aries Kasa S

## 2021-01-18 NOTE — Telephone Encounter (Signed)
Called and talked to patient's wife [name]) and patient's wife stated that the patient did have some blood on the big toe and may have dripped down on the bottom of the foot and the 2nd toe and was dried and I stated that was normal and as long as it was no more than a fifty cent piece and to keep elevating and icing which the patient is doing and no more than 15 minutes on the hour and patient can wiggle toes and slept on the coach with the boot on and did take 3 ibuprofen yesterday and wife ordered a knee scooter and will be at the home today and stated to call the office if any concerns or questions that may arise. Misty Stanley

## 2021-01-18 NOTE — Telephone Encounter (Signed)
Patient's(surgery 1 day ago), wife is calling and wants to know how much blood on bandage is too much,is noticing some across the top and bottom of the bandage. Please advise.

## 2021-01-19 ENCOUNTER — Other Ambulatory Visit: Payer: Self-pay

## 2021-01-19 ENCOUNTER — Encounter: Payer: Self-pay | Admitting: Podiatry

## 2021-01-19 ENCOUNTER — Ambulatory Visit (INDEPENDENT_AMBULATORY_CARE_PROVIDER_SITE_OTHER): Payer: 59 | Admitting: Podiatry

## 2021-01-19 VITALS — Temp 97.4°F

## 2021-01-19 DIAGNOSIS — IMO0002 Reserved for concepts with insufficient information to code with codable children: Secondary | ICD-10-CM

## 2021-01-19 DIAGNOSIS — E1149 Type 2 diabetes mellitus with other diabetic neurological complication: Secondary | ICD-10-CM

## 2021-01-19 DIAGNOSIS — M86172 Other acute osteomyelitis, left ankle and foot: Secondary | ICD-10-CM

## 2021-01-19 DIAGNOSIS — L97524 Non-pressure chronic ulcer of other part of left foot with necrosis of bone: Secondary | ICD-10-CM

## 2021-01-19 DIAGNOSIS — E1165 Type 2 diabetes mellitus with hyperglycemia: Secondary | ICD-10-CM

## 2021-01-22 LAB — SURGICAL PATHOLOGY

## 2021-01-23 ENCOUNTER — Encounter: Payer: Self-pay | Admitting: Podiatry

## 2021-01-23 ENCOUNTER — Other Ambulatory Visit: Payer: Self-pay

## 2021-01-23 ENCOUNTER — Ambulatory Visit (INDEPENDENT_AMBULATORY_CARE_PROVIDER_SITE_OTHER): Payer: 59 | Admitting: Podiatry

## 2021-01-23 DIAGNOSIS — M79674 Pain in right toe(s): Secondary | ICD-10-CM | POA: Diagnosis not present

## 2021-01-23 DIAGNOSIS — E1165 Type 2 diabetes mellitus with hyperglycemia: Secondary | ICD-10-CM

## 2021-01-23 DIAGNOSIS — M79675 Pain in left toe(s): Secondary | ICD-10-CM | POA: Diagnosis not present

## 2021-01-23 DIAGNOSIS — IMO0002 Reserved for concepts with insufficient information to code with codable children: Secondary | ICD-10-CM

## 2021-01-23 DIAGNOSIS — E1149 Type 2 diabetes mellitus with other diabetic neurological complication: Secondary | ICD-10-CM

## 2021-01-23 DIAGNOSIS — B351 Tinea unguium: Secondary | ICD-10-CM

## 2021-01-23 DIAGNOSIS — L97524 Non-pressure chronic ulcer of other part of left foot with necrosis of bone: Secondary | ICD-10-CM

## 2021-01-24 NOTE — Op Note (Signed)
PATIENT:  [name]  65 y.o. male   PRE-OPERATIVE DIAGNOSIS:  ULCER LEFT FOOT, OSTEOMYELITIS   POST-OPERATIVE DIAGNOSIS:  ULCER LEFT FOOT, OSTEOMYELITIS    PROCEDURE:  Procedure(s): AMPUTATION TOE MPJ LEFT FOOT (Left)   SURGEON:  Surgeon(s) and Role:    * Trula Slade, DPM - Primary   PHYSICIAN ASSISTANT:   ASSISTANTS: none    ANESTHESIA:   MAC   EBL:  25 mL    BLOOD ADMINISTERED:none   DRAINS: none    LOCAL MEDICATIONS USED:  MARCAINE    and XYLOCAINE   SPECIMEN:  Source of Specimen:  TOE FOR PATHOLOGY    DISPOSITION OF SPECIMEN:  PATHOLOGY   COUNTS:  YES   TOURNIQUET:  * Missing tourniquet times found for documented tourniquets in log: [id number] *   DICTATION: .Dragon Dictation   PLAN OF CARE: Discharge to home after PACU   PATIENT DISPOSITION:  PACU - hemodynamically stable.   Delay start of Pharmacological VTE agent (>24hrs) due to surgical blood loss or risk of bleeding: no  Indications for surgery: Patient had a left toe ulceration and was seen at wound care center and MRI was obtained which did reveal osteomyelitis.  The patient presented for surgical consult.  I reviewed MRI as well as updated x-rays which reveal osteomyelitis.  Given the infection we discussed limb salvage versus amputation and recommended amputation. We discussed the surgery and post-op course and he wishes to proceed. \We discussed in the office partial versus total toe amputation. However the day of the surgery given the erythema to mostly the entire toe we discussed total toe amputation, at the level of the MTPJ and he agrees. Consent signed.   Procedure in detail: The patient was both verbally and visually identified myself the nursing staff and the anesthesia staff preoperatively.  He was then transferred to the operating room via stretcher.  A well-padded ankle tourniquet was applied however of note this was not inflated the procedure.  The mixture of lidocaine, Marcaine plain was  infiltrated in a regional block fashion after timeout was performed.  The left lower extremities and scrubbed, prepped, draped in normal sterile fashion.  Incision was planned along the first MPJ.  Incision was made with a 15 blade scalpel from skin to bone just distal to the first MPJ but proximal to the area of erythema.  This was to the level of the joint.  The MPJ was identified and the toe was then disarticulated.  The metatarsal head appeared to be viable.  Is hard in nature, white in color.  There is no proximal tracking or signs of abscess proximally.  Hemostasis was achieved.  Incision was copiously irrigated saline.  The incision was then closed in a simple interrupted fashion with nylon.  Xeroform was applied followed by a dry sterile dressing.  The patient was then awoken from anesthesia and found to have tolerated the procedure well and complications.  He was then transferred to PACU vital signs stable and vascular status intact.  Patient will be discharged home with oral antibiotics, weightbearing to heel, elevation.  He will follow-up next week in the office.

## 2021-01-24 NOTE — Progress Notes (Signed)
Subjective: [name] is a 65 y.o. is seen today in office s/p left hallux amputation preformed on 01/17/2021.  He presents today as there was some blood on the bandage and I want to see him prior to the weekend to have it changed.  Not had any significant pain not take any other pain medication other than ibuprofen.  He has been nonweightbearing.  His wife to get him a knee scooter to help take pressure off the foot.  Denies any systemic complaints such as fevers, chills, nausea, vomiting. No calf pain, chest pain, shortness of breath.   Objective: General: No acute distress, AAOx3  DP/PT pulses palpable 2/4, CRT < 3 sec to all digits.  Left foot: There is mild strikethrough on the bandage with bloody drainage.  Incision is well coapted without any evidence of dehiscence with sutures intact.  No active bleeding identified.  There is localized edema and faint erythema at around the surgical site there is no ascending cellulitis.  There is no fluctuance crepitation but there is a moderate.  Does appear that the erythema is more from inflammation from the surgery but will monitor. No pain with calf compression, swelling, warmth, erythema.   Assessment and Plan:  Status post left hallux amputation, doing well with no complications   -Treatment options discussed including all alternatives, risks, and complications -Dressing was changed today.  No active bleeding.  Incision was cleaned.  Xeroform was applied followed by dry sterile dressing.  Keep dressing clean, dry, intact. -Elevation -Pain medication as needed. -Nonweightbearing for now -Glucose control -Monitor for any clinical signs or symptoms of infection and DVT/PE and directed to call the office immediately should any occur or go to the ER. -Follow-up as scheduled or sooner if any problems arise. In the meantime, encouraged to call the office with any questions, concerns, change in symptoms.   Ovid Curd, DPM

## 2021-01-28 NOTE — Progress Notes (Signed)
Subjective: [name] is a 65 y.o. is seen today in office s/p left hallux amputation preformed on 01/17/2021.  He has been doing well without any new changes compared to meloxicam on Friday.  No drainage on the bandage he reports.  No fevers or chills.  No pain.  Also for the nails to be trimmed today as well.  Objective: General: No acute distress, AAOx3  DP/PT pulses palpable 2/4, CRT < 3 sec to all digits.  Left foot: There is mild strikethrough on the bandage with mild bloody drainage.  Incision is well coapted without any evidence of dehiscence with sutures intact.  No active bleeding identified.  The edema in the erythema is present on Friday has much improved.  There is no drainage or pus coming the incision.  No ascending cellulitis.  No fluctuance crepitation but was malodor. On the right side the nails are hypertrophic, dystrophic with yellow-brown discoloration.  There is no edema, erythema to the toenail sites. No pain with calf compression, swelling, warmth, erythema.   Assessment and Plan:  Status post left hallux amputation, doing well with no complications  right foot symptomatic onychomycosis  -Treatment options discussed including all alternatives, risks, and complications -Dressing was changed today.  No active bleeding.  Incision was cleaned.  Xeroform was applied followed by dry sterile dressing.  Keep dressing clean, dry, intact.  Encouraged elevation, cam boot.  He is continue with nonweightbearing.  Finish course of Augmentin -Debrided nails x5 without any complications or bleeding on the right side.  *X-ray left foot next appointment  Ovid Curd, DPM

## 2021-02-01 ENCOUNTER — Ambulatory Visit (INDEPENDENT_AMBULATORY_CARE_PROVIDER_SITE_OTHER): Payer: 59 | Admitting: Podiatry

## 2021-02-01 ENCOUNTER — Ambulatory Visit (INDEPENDENT_AMBULATORY_CARE_PROVIDER_SITE_OTHER): Payer: 59

## 2021-02-01 ENCOUNTER — Other Ambulatory Visit: Payer: Self-pay

## 2021-02-01 DIAGNOSIS — L97524 Non-pressure chronic ulcer of other part of left foot with necrosis of bone: Secondary | ICD-10-CM

## 2021-02-01 DIAGNOSIS — IMO0002 Reserved for concepts with insufficient information to code with codable children: Secondary | ICD-10-CM

## 2021-02-01 DIAGNOSIS — E1165 Type 2 diabetes mellitus with hyperglycemia: Secondary | ICD-10-CM

## 2021-02-01 DIAGNOSIS — E1149 Type 2 diabetes mellitus with other diabetic neurological complication: Secondary | ICD-10-CM

## 2021-02-07 NOTE — Progress Notes (Signed)
Subjective: [name] is a 65 y.o. is seen today in office s/p left hallux amputation preformed on 01/17/2021.  He continues to do well.  Is been try to stay off the foot is much as possible using knee scooter.  Still finishing up Augmentin.  No drainage on the bandage he reports.  No fevers or chills.  No pain.  No other concerns.  Objective: General: No acute distress, AAOx3  DP/PT pulses palpable 2/4, CRT < 3 sec to all digits.  Left foot: There is minimal strikethrough on the bandage.  Patient is well coapted with sutures intact.  Small mount of dried blood present which was able to remove the majority of this incision still coapted.  There is minimal edema.  No erythema or warmth.  No drainage or pus.  No pain.  Normal temperature gradient to the foot. No pain with calf compression, swelling, warmth, erythema.   Assessment and Plan:  Status post left hallux amputation, doing well with no complications  right foot symptomatic onychomycosis  -Treatment options discussed including all alternatives, risks, and complications -X-rays obtained reviewed.  No evidence of acute fracture, osteomyelitis or soft tissue emphysema. -Dressing was changed today.  No active bleeding.  Incision was cleaned.  Xeroform was applied followed by dry sterile dressing.  Keep dressing clean, dry, intact.  Encouraged elevation, cam boot.  He is continue with nonweightbearing.  Finish course of Augmentin  *Possible suture removal next appointment  Ovid Curd, DPM

## 2021-02-08 ENCOUNTER — Ambulatory Visit (INDEPENDENT_AMBULATORY_CARE_PROVIDER_SITE_OTHER): Payer: 59 | Admitting: Podiatry

## 2021-02-08 ENCOUNTER — Other Ambulatory Visit: Payer: Self-pay

## 2021-02-08 DIAGNOSIS — L97524 Non-pressure chronic ulcer of other part of left foot with necrosis of bone: Secondary | ICD-10-CM

## 2021-02-08 DIAGNOSIS — IMO0002 Reserved for concepts with insufficient information to code with codable children: Secondary | ICD-10-CM

## 2021-02-08 DIAGNOSIS — E1149 Type 2 diabetes mellitus with other diabetic neurological complication: Secondary | ICD-10-CM

## 2021-02-08 DIAGNOSIS — E1165 Type 2 diabetes mellitus with hyperglycemia: Secondary | ICD-10-CM

## 2021-02-13 ENCOUNTER — Encounter: Payer: 59 | Admitting: Podiatry

## 2021-02-13 NOTE — Progress Notes (Signed)
Subjective: [name] is a 65 y.o. is seen today in office s/p left hallux amputation preformed on 01/17/2021.  Presents for possible suture removal.  States he is doing well and not having any pain.  He is about finished with a course of Augmentin.  He has been on it longer as he had some at home that he finished before getting the prescription sent after surgery.  He is to keep the incision covered and clean.  Denies any fevers or chills.  No other concerns today.  Objective: General: No acute distress, AAOx3  DP/PT pulses palpable 2/4, CRT < 3 sec to all digits.  Left foot: There is minimal strikethrough on the bandage.  Swelling is Resolving incision/dried blood.  Incision is well coapted without any dehiscence.  Minimal edema.  No erythema, ascending cellulitis.  No drainage or pus.  No increase in warmth.  No pain with calf compression, swelling, warmth, erythema.   Assessment and Plan:  Status post left hallux amputation, doing well with no complications  right foot symptomatic onychomycosis  -Treatment options discussed including all alternatives, risks, and complications -I reviewed the sutures today and incision is well coapted still.  Small amount of antibiotic ointment was applied followed by dressing.  Keep dressing clean, dry, intact for now but he can change it over the weekend with a similar bandage which we discussed. -Continue cam boot and limit weightbearing.  Encouraged elevation.  *Possible suture removal next appointment  Ovid Curd, DPM

## 2021-02-15 ENCOUNTER — Encounter: Payer: 59 | Admitting: Podiatry

## 2021-02-15 ENCOUNTER — Ambulatory Visit (INDEPENDENT_AMBULATORY_CARE_PROVIDER_SITE_OTHER): Payer: 59 | Admitting: Podiatry

## 2021-02-15 ENCOUNTER — Other Ambulatory Visit: Payer: Self-pay

## 2021-02-15 DIAGNOSIS — L97524 Non-pressure chronic ulcer of other part of left foot with necrosis of bone: Secondary | ICD-10-CM

## 2021-02-15 DIAGNOSIS — E1149 Type 2 diabetes mellitus with other diabetic neurological complication: Secondary | ICD-10-CM

## 2021-02-15 DIAGNOSIS — E1165 Type 2 diabetes mellitus with hyperglycemia: Secondary | ICD-10-CM

## 2021-02-15 DIAGNOSIS — IMO0002 Reserved for concepts with insufficient information to code with codable children: Secondary | ICD-10-CM

## 2021-02-17 NOTE — Progress Notes (Signed)
Subjective: [name] is a 65 y.o. is seen today in office s/p left hallux amputation preformed on 01/17/2021.  Presents to remove the remainder of the sutures.  Walking in the cam boot.  He does admit that he did climb a 3 story building yesterday.  No recent injury or falls.  No fevers or chills.  No other concerns.    Objective: General: No acute distress, AAOx3  DP/PT pulses palpable 2/4, CRT < 3 sec to all digits.  Left foot: Incision is well coapted.  A few of the sutures are still intact.  There are some scab present on the incision as well.  Minimal edema.  No cellulitis identified.  No fluctuance or crepitation.  There is no drainage or pus identified. No pain with calf compression, swelling, warmth, erythema.   Assessment and Plan:  Status post left hallux amputation, doing well with no complications  right foot symptomatic onychomycosis  -Treatment options discussed including all alternatives, risks, and complications -I remove the remainder the sutures today.  Incision is well coapted.  There is a scab present on the incision as well.  Recommended a small amount of antibiotic ointment and dressing daily.  Continue cam boot but discussed the importance of trying to stay off the foot and elevation to allow this to heal. -Monitor for any clinical signs or symptoms of infection and directed to call the office immediately should any occur or go to the ER.  Return in about 2 weeks (around 03/01/2021).  Vivi Barrack DPM

## 2021-02-19 ENCOUNTER — Telehealth: Payer: Self-pay | Admitting: *Deleted

## 2021-02-19 NOTE — Telephone Encounter (Signed)
Patient is calling no instructions were given at last office visit for caring for /washing his foot. Please call.  Returned the call back to patient and gave instructions per doctor's note for caring for the foot, verbalized understanding and will keep f/u appointment in 2 weeks.

## 2021-02-23 ENCOUNTER — Other Ambulatory Visit: Payer: Self-pay | Admitting: Podiatry

## 2021-02-23 ENCOUNTER — Telehealth: Payer: Self-pay | Admitting: Podiatry

## 2021-02-23 MED ORDER — CEPHALEXIN 500 MG PO CAPS: 500.0000 mg | ORAL_CAPSULE | Freq: Four times a day (QID) | ORAL | 0 refills | Status: DC

## 2021-02-23 NOTE — Telephone Encounter (Signed)
Patient called... surgery site is red and a little warm. There is no drainage. Please advise. First POV is Thursday, 9/1.

## 2021-02-23 NOTE — Telephone Encounter (Signed)
Called and spoke with the patient and relayed the message per Dr Wagoner. [name] 

## 2021-03-01 ENCOUNTER — Other Ambulatory Visit: Payer: Self-pay

## 2021-03-01 ENCOUNTER — Ambulatory Visit (INDEPENDENT_AMBULATORY_CARE_PROVIDER_SITE_OTHER): Payer: 59 | Admitting: Podiatry

## 2021-03-01 DIAGNOSIS — E1165 Type 2 diabetes mellitus with hyperglycemia: Secondary | ICD-10-CM

## 2021-03-01 DIAGNOSIS — L97524 Non-pressure chronic ulcer of other part of left foot with necrosis of bone: Secondary | ICD-10-CM

## 2021-03-01 DIAGNOSIS — E1149 Type 2 diabetes mellitus with other diabetic neurological complication: Secondary | ICD-10-CM

## 2021-03-01 DIAGNOSIS — M86172 Other acute osteomyelitis, left ankle and foot: Secondary | ICD-10-CM

## 2021-03-01 DIAGNOSIS — IMO0002 Reserved for concepts with insufficient information to code with codable children: Secondary | ICD-10-CM

## 2021-03-07 NOTE — Progress Notes (Signed)
Subjective: [name] is a 65 y.o. is seen today in office s/p left hallux amputation preformed on 01/17/2021.  Overall states is looking better.  He does admit that he has been working he has been walking in the cam boot though.  He denies any fevers or chills currently.  He did call that there was some swelling or redness I prescribed antibiotics and he states this is doing much better.  Not see any drainage or pus.  No other concerns.  No fevers or chills.   Objective: General: No acute distress, AAOx3  DP/PT pulses palpable 2/4, CRT < 3 sec to all digits.  Left foot: Incision is well coapted.  Small scab is present on the incision but there is no evidence of dehiscence.  There is marked with there was redness which appeared to be about 2 cm proximal to the incision but there is currently faint dark erythema along the incision but there is no ascending cellulitis.  Appears to be receding and clinically he states is also doing much better.  There is no drainage or pus.  No fluctuance or crepitation.  Mild edema.  Assessment and Plan:  Status post left hallux amputation  -Treatment options discussed including all alternatives, risks, and complications -Incisions well coapted.  On the finished course of antibiotics.  Encouraged to continue in the cam boot and limit activity.  Elevation. -Monitor for any clinical signs or symptoms of infection and directed to call the office immediately should any occur or go to the ER.  Return in about 2 weeks (around 03/15/2021).  Vivi Barrack DPM

## 2021-03-15 ENCOUNTER — Other Ambulatory Visit: Payer: Self-pay

## 2021-03-15 ENCOUNTER — Telehealth: Payer: Self-pay | Admitting: Podiatry

## 2021-03-15 ENCOUNTER — Ambulatory Visit (INDEPENDENT_AMBULATORY_CARE_PROVIDER_SITE_OTHER): Payer: 59 | Admitting: Podiatry

## 2021-03-15 DIAGNOSIS — E1165 Type 2 diabetes mellitus with hyperglycemia: Secondary | ICD-10-CM

## 2021-03-15 DIAGNOSIS — E1149 Type 2 diabetes mellitus with other diabetic neurological complication: Secondary | ICD-10-CM

## 2021-03-15 DIAGNOSIS — IMO0002 Reserved for concepts with insufficient information to code with codable children: Secondary | ICD-10-CM

## 2021-03-15 DIAGNOSIS — L97524 Non-pressure chronic ulcer of other part of left foot with necrosis of bone: Secondary | ICD-10-CM

## 2021-03-15 NOTE — Telephone Encounter (Signed)
Called pt per Dr Ardelle Anton as he had ordered a toe filler insert and I noticed pt was diabetic as well and would qualify for diabetic shoes and inserts. Pt is interested in getting shoes to go with the inserts and is going to check with his wife and call me back tomorrow to schedule an appt. He is to ask for me.

## 2021-03-19 NOTE — Progress Notes (Signed)
Subjective: [name] is a 65 y.o. is seen today in office s/p left hallux amputation preformed on 01/17/2021.  States he has been doing better.  He today is gone to open toe sandal.  The incision is healed.  No drainage or swelling.  No fevers or chills.  No other concerns.    Objective: General: No acute distress, AAOx3  DP/PT pulses palpable 2/4, CRT < 3 sec to all digits.  Left foot: Incision is well coapted.  There is 1 small area of a scab that is still present but there is no evidence of dehiscence otherwise.  There is faint edema without any erythema, drainage or pus or any signs of infection.  Hammertoe contractures present of the lesser digits.  No other open lesions.  No pain with calf compression, erythema or warmth.  Assessment and Plan:  Status post left hallux amputation  -Treatment options discussed including all alternatives, risks, and complications -Incisions healing well.  At this point discussed [name] is to keep a close monitoring of the amputation site although appears to be doing well at this time.  He was measured for inserts, toe filler today. -Daily foot inspection, glucose control. -Monitor for any clinical signs or symptoms of infection and directed to call the office immediately should any occur or go to the ER.  Vivi Barrack DPM

## 2021-04-06 ENCOUNTER — Ambulatory Visit (INDEPENDENT_AMBULATORY_CARE_PROVIDER_SITE_OTHER): Payer: 59 | Admitting: Podiatry

## 2021-04-06 ENCOUNTER — Other Ambulatory Visit: Payer: Self-pay

## 2021-04-06 DIAGNOSIS — S90222A Contusion of left lesser toe(s) with damage to nail, initial encounter: Secondary | ICD-10-CM

## 2021-04-06 DIAGNOSIS — M2042 Other hammer toe(s) (acquired), left foot: Secondary | ICD-10-CM | POA: Diagnosis not present

## 2021-04-06 DIAGNOSIS — T148XXA Other injury of unspecified body region, initial encounter: Secondary | ICD-10-CM

## 2021-04-06 MED ORDER — CEPHALEXIN 500 MG PO CAPS: 500.0000 mg | ORAL_CAPSULE | Freq: Three times a day (TID) | ORAL | 0 refills | Status: DC

## 2021-04-11 NOTE — Progress Notes (Signed)
Subjective: [name] is a 65 y.o. is seen today in office s/p left hallux amputation preformed on 01/17/2021.  He presents today as he is concerned about discoloration to his lesser digit toenails and dark discoloration which has been ongoing for about 1 week without any injury.  He has not seen any swelling or drainage.  No recent treatment.  No injury.   Objective: General: No acute distress, AAOx3 -presents today wearing regular shoes DP/PT pulses palpable 2/4, CRT < 3 sec to all digits.  Left foot: Incision for the prior surgery is well-healed.  Hammertoe contractures are present of the lesser digits and there is dried blood present on the digit nails 2, 3, 4 in the distal portion of the second third digit there is preulcerative areas which appear to be old blisters but there is no skin breakdown or warmth or any drainage or pus or any signs of infection.   No pain with calf compression, erythema or warmth.  Assessment and Plan:  Status post left hallux amputation; hammertoe contractures with subungual hematoma, old blisters  -Treatment options discussed including all alternatives, risks, and complications -I think the changes in the lesser digits are result of the hammertoes and compensation.  He has been wearing regular shoes and is awaiting an insert.  I recommend a small amount of antibiotic ointment around the nails.  Discussed the nail may come off on its own.  He needs to monitor closely for any signs or symptoms of infection or any open sores.  Return in about 3 weeks (around 04/27/2021).  Vivi Barrack DPM

## 2021-04-12 ENCOUNTER — Encounter: Payer: Medicare Other | Admitting: Podiatry

## 2021-04-27 ENCOUNTER — Other Ambulatory Visit: Payer: Self-pay

## 2021-04-27 ENCOUNTER — Ambulatory Visit (INDEPENDENT_AMBULATORY_CARE_PROVIDER_SITE_OTHER): Payer: 59 | Admitting: Podiatry

## 2021-04-27 DIAGNOSIS — M2042 Other hammer toe(s) (acquired), left foot: Secondary | ICD-10-CM | POA: Diagnosis not present

## 2021-04-27 DIAGNOSIS — M79675 Pain in left toe(s): Secondary | ICD-10-CM | POA: Diagnosis not present

## 2021-04-27 DIAGNOSIS — B351 Tinea unguium: Secondary | ICD-10-CM

## 2021-04-27 DIAGNOSIS — T148XXA Other injury of unspecified body region, initial encounter: Secondary | ICD-10-CM

## 2021-04-27 DIAGNOSIS — M79674 Pain in right toe(s): Secondary | ICD-10-CM | POA: Diagnosis not present

## 2021-04-27 DIAGNOSIS — S90222D Contusion of left lesser toe(s) with damage to nail, subsequent encounter: Secondary | ICD-10-CM | POA: Diagnosis not present

## 2021-04-28 NOTE — Progress Notes (Signed)
Subjective: [name] is a 65 y.o. is seen today.  Evaluation for subungual hematoma, digital contractures, blisters.  He states that he has not seen any open sores or any swelling or redness or drainage.  Has some discoloration still present of the second, third, fourth digit toes on left foot but has not seen any drainage or pus.  Asking for the nails to be trimmed today as are thickened elongated he cannot do them himself and he has noticed to cut his toenails given the amputation.  He has no new concerns today.   Objective: General: No acute distress, AAOx3 -presents today wearing regular shoes DP/PT pulses palpable 2/4, CRT < 3 sec to all digits.  Left foot: Hammertoe contractures are present of the lesser digits and there is dried blood present on the digit nails 2, 3, 4.  Appears to be dry there is no surrounding edema, erythema to the toe sites.  The nails are somewhat loose to the distal aspect of family here approximately.  There is no open sores, blisters present. Nails appear to be hypertrophic, dystrophic with yellow-brown discoloration otherwise.  Nails affected are 1 through 5 on the right and 2 through 5 on the left.  The nails do cause discomfort as they get elongated. No pain with calf compression, erythema or warmth.  Assessment and Plan:  Symptomatic onychomycosis, resolved blisters; subungual hematoma  -Treatment options discussed including all alternatives, risks, and complications -Sharply debrided the nails x9 without any complications or bleeding.  Hopefully the nails with the dried blood will grow out but discussed that may come off on their own.  Monitor for any signs or symptoms of infection.  Discussed daily foot inspection.  To continue the toe crest as he states this is been very helpful for him.  Vivi Barrack DPM

## 2021-05-18 ENCOUNTER — Telehealth: Payer: Self-pay | Admitting: Podiatry

## 2021-05-18 NOTE — Telephone Encounter (Signed)
Pt called back to give me the shoes his wife picked out. At first the wanted the new balance 990 and I explained that we are not able to get those and she chose the orthofeet 487 highline boot. I have ordered it and his inserts and have already gotten the needed documents.

## 2021-05-28 ENCOUNTER — Ambulatory Visit: Payer: Medicare Other | Admitting: Podiatry

## 2021-05-29 ENCOUNTER — Encounter: Payer: Self-pay | Admitting: Podiatry

## 2021-05-29 ENCOUNTER — Other Ambulatory Visit: Payer: Self-pay

## 2021-05-29 ENCOUNTER — Ambulatory Visit (INDEPENDENT_AMBULATORY_CARE_PROVIDER_SITE_OTHER): Payer: 59 | Admitting: Podiatry

## 2021-05-29 DIAGNOSIS — M79675 Pain in left toe(s): Secondary | ICD-10-CM | POA: Diagnosis not present

## 2021-05-29 DIAGNOSIS — B351 Tinea unguium: Secondary | ICD-10-CM | POA: Diagnosis not present

## 2021-05-29 DIAGNOSIS — E785 Hyperlipidemia, unspecified: Secondary | ICD-10-CM | POA: Insufficient documentation

## 2021-05-29 DIAGNOSIS — M79674 Pain in right toe(s): Secondary | ICD-10-CM

## 2021-05-29 DIAGNOSIS — E669 Obesity, unspecified: Secondary | ICD-10-CM | POA: Insufficient documentation

## 2021-05-29 DIAGNOSIS — Z89419 Acquired absence of unspecified great toe: Secondary | ICD-10-CM | POA: Diagnosis not present

## 2021-05-29 DIAGNOSIS — N529 Male erectile dysfunction, unspecified: Secondary | ICD-10-CM | POA: Insufficient documentation

## 2021-05-29 DIAGNOSIS — E113299 Type 2 diabetes mellitus with mild nonproliferative diabetic retinopathy without macular edema, unspecified eye: Secondary | ICD-10-CM | POA: Insufficient documentation

## 2021-05-30 NOTE — Progress Notes (Signed)
Subjective: [name] is a 65 y.o. is seen today for diabetic foot exam and to have his nails trimmed.  He states that the nails on the left side with a blood was underneath him come off.  He does not see any open sores or swelling or redness or any drainage.  Amputation site is been healing well.  Has been on his feet being active without any issues.  No other concerns today.  Objective: General: No acute distress, AAOx3 -presents today wearing regular shoes DP/PT pulses palpable 2/4, CRT < 3 sec to all digits.  Left foot: Hammertoe contractures are present of the lesser digits on the left side.  The nails have come off on the lesser digits except for the left fifth toenail.  The nail beds have healed there is no open sores.  The nails on the right side are elongated, dystrophic, discolored with yellow discoloration causing discomfort of the elongated.  There is no open sores identified bilaterally. No pain with calf compression, erythema or warmth.  Assessment and Plan:  Symptomatic onychomycosis, onycholysis  -Treatment options discussed including all alternatives, risks, and complications -Sharply debrided the nails x6 without any complications or bleeding.   -Continue offloading pads, daily foot inspection, glucose control   Vivi Barrack DPM

## 2021-07-09 ENCOUNTER — Other Ambulatory Visit: Payer: Self-pay

## 2021-07-09 ENCOUNTER — Ambulatory Visit: Payer: 59

## 2021-07-09 DIAGNOSIS — Z89419 Acquired absence of unspecified great toe: Secondary | ICD-10-CM

## 2021-07-09 DIAGNOSIS — L97524 Non-pressure chronic ulcer of other part of left foot with necrosis of bone: Secondary | ICD-10-CM

## 2021-07-09 NOTE — Progress Notes (Signed)
SITUATION Reason for Visit: Fitting of Diabetic Shoes & Insoles Patient / Caregiver Report:  Patient reports comfort  OBJECTIVE DATA: Patient History / Diagnosis:     ICD-10-CM   1. History of amputation of great toe (HCC)  Z89.419     2. Ulcerated, foot, left, with necrosis of bone (HCC)  L97.524       Change in Status:   None  ACTIONS PERFORMED: In-Person Delivery, patient was fit with: - 1x pair [id number] PDAC approved prefabricated Diabetic Shoes: Orthofeet boots - 3x pair [id number] PDAC approved CAM milled custom diabetic insoles  Shoes and insoles were verified for structural integrity and safety. Patient wore shoes and insoles in office. Skin was inspected and free of areas of concern after wearing shoes and inserts. Shoes and inserts fit properly. Patient / Caregiver provided with ferbal instruction and demonstration regarding donning, doffing, wear, care, proper fit, function, purpose, cleaning, and use of shoes and insoles ' and in all related precautions and risks and benefits regarding shoes and insoles. Patient / Caregiver was instructed to wear properly fitting socks with shoes at all times. Patient was also provided with verbal instruction regarding how to report any failures or malfunctions of shoes or inserts, and necessary follow up care. Patient / Caregiver was also instructed to contact physician regarding change in status that may affect function of shoes and inserts.   Patient / Caregiver verbalized undersatnding of instruction provided. Patient / Caregiver demonstrated independence with proper donning and doffing of shoes and inserts.  PLAN Patient to follow up as needed. Plan of care was discussed with and agreed upon by patient and/or caregiver. All questions were answered and concerns addressed.

## 2021-07-10 DIAGNOSIS — I10 Essential (primary) hypertension: Secondary | ICD-10-CM | POA: Diagnosis not present

## 2021-07-10 DIAGNOSIS — F419 Anxiety disorder, unspecified: Secondary | ICD-10-CM | POA: Diagnosis not present

## 2021-07-18 ENCOUNTER — Ambulatory Visit (INDEPENDENT_AMBULATORY_CARE_PROVIDER_SITE_OTHER): Payer: BC Managed Care – PPO | Admitting: Podiatry

## 2021-07-18 ENCOUNTER — Other Ambulatory Visit: Payer: Self-pay

## 2021-07-18 DIAGNOSIS — S90121A Contusion of right lesser toe(s) without damage to nail, initial encounter: Secondary | ICD-10-CM | POA: Diagnosis not present

## 2021-07-18 NOTE — Progress Notes (Signed)
error 

## 2021-07-19 NOTE — Progress Notes (Signed)
Subjective:  Patient ID: [name], male    DOB: [birth year],  MRN: [id number]  Chief Complaint  Patient presents with   Toe Pain    Pt states his toe crest got twisted and caused his toe to be red     66 y.o. male presents with the above complaint.  This patient presents with complaint of right second digit pain.  Patient states that he got it and twisted in a crescent cause his toe to be red.  It is doing much better today.  This happened a few days ago but he thought it was getting worse.  He is a diabetic and was concerned for any kind of ulceration microform.  However is doing much better today no more redness.  He wanted to get it eval just to make sure.  He denies any other acute complaints.  He is a diabetic with last A1c of 9.9.  No nausea fever chills vomiting   Review of Systems: Negative except as noted in the HPI. Denies N/V/F/Ch.  Past Medical History:  Diagnosis Date   Diabetes mellitus without complication (HCC)    Hypertension     Current Outpatient Medications:    amoxicillin (AMOXIL) 500 MG capsule, 1 capsule, Disp: , Rfl:    aspirin 81 MG chewable tablet, 1 tablet, Disp: , Rfl:    aspirin EC 81 MG tablet, Take 1 tablet (81 mg total) by mouth daily. Swallow whole., Disp: 90 tablet, Rfl: 3   atorvastatin (LIPITOR) 20 MG tablet, Take 20 mg by mouth daily., Disp: , Rfl:    B-D ULTRAFINE III SHORT PEN 31G X 8 MM MISC, Inject 1 Syringe into the skin daily., Disp: , Rfl:    cephALEXin (KEFLEX) 500 MG capsule, Take 1 capsule (500 mg total) by mouth 3 (three) times daily., Disp: 21 capsule, Rfl: 0   cyclobenzaprine (FLEXERIL) 10 MG tablet, 1 tablet at bedtime as needed, Disp: , Rfl:    HYDROcodone-acetaminophen (NORCO/VICODIN) 5-325 MG tablet, Take 1-2 tablets by mouth every 6 (six) hours as needed., Disp: 15 tablet, Rfl: 0   lisinopril (ZESTRIL) 20 MG tablet, 1 tablet, Disp: , Rfl:    lisinopril (ZESTRIL) 40 MG tablet, Take 40 mg by mouth daily., Disp: , Rfl:     metFORMIN (GLUCOPHAGE-XR) 500 MG 24 hr tablet, Take 1,000 mg by mouth 2 (two) times daily., Disp: , Rfl:    metoprolol tartrate (LOPRESSOR) 100 MG tablet, TAKE 2 HOURS PRIOR TO CT SCAN (Patient not taking: No sig reported), Disp: 1 tablet, Rfl: 0   naproxen (EC NAPROSYN) 500 MG EC tablet, 1 tablet, Disp: , Rfl:    OZEMPIC, 0.25 OR 0.5 MG/DOSE, 2 MG/1.5ML SOPN, Inject into the skin., Disp: , Rfl:    rosuvastatin (CRESTOR) 40 MG tablet, Take 1 tablet (40 mg total) by mouth daily. (Patient not taking: Reported on 01/11/2021), Disp: 90 tablet, Rfl: 3   sitaGLIPtin-metformin (JANUMET) 50-1000 MG tablet, 2 tablets, Disp: , Rfl:    testosterone cypionate (DEPOTESTOSTERONE CYPIONATE) 200 MG/ML injection, Inject 200 mg into the muscle once a week., Disp: , Rfl:    TRESIBA FLEXTOUCH 200 UNIT/ML FlexTouch Pen, Inject into the skin., Disp: , Rfl:   Social History   Tobacco Use  Smoking Status Never  Smokeless Tobacco Never  Tobacco Comments   expose to passive smoke    Allergies  Allergen Reactions   Doxycycline    Objective:  There were no vitals filed for this visit. There is no height or weight  on file to calculate BMI. Constitutional Well developed. Well nourished.  Vascular Dorsalis pedis pulses palpable bilaterally. Posterior tibial pulses palpable bilaterally. Capillary refill normal to all digits.  No cyanosis or clubbing noted. Pedal hair growth normal.  Neurologic Normal speech. Oriented to person, place, and time. Epicritic sensation to light touch grossly present bilaterally.  Dermatologic Nails well groomed and normal in appearance. No open wounds. No skin lesions.  Orthopedic: Mild pain on palpation right second digit.  No damage to the underlying nail.  No damage to the nailbed.  No ecchymosis bruising noted.  No redness noted.   Radiographs: None Assessment:   1. Contusion of lesser toe of right foot without damage to nail, initial encounter    Plan:  Patient was  evaluated and treated and all questions answered.  Right second digit mild toe contusion -All questions or concerns were discussed with the patient in extensive detail. -At this time I discussed with the patient clinically there is no concern for any kind of infection.  Continue clinical monitoring if he gets worse go to the emergency room or come see me right away. -At this time he can resume normal activities. -If any foot and ankle issues arise in future to come back and see me.  No follow-ups on file.

## 2021-08-17 DIAGNOSIS — F419 Anxiety disorder, unspecified: Secondary | ICD-10-CM | POA: Diagnosis not present

## 2021-08-17 DIAGNOSIS — I10 Essential (primary) hypertension: Secondary | ICD-10-CM | POA: Diagnosis not present

## 2021-08-28 ENCOUNTER — Ambulatory Visit (INDEPENDENT_AMBULATORY_CARE_PROVIDER_SITE_OTHER): Payer: Medicare Other | Admitting: Podiatry

## 2021-08-28 ENCOUNTER — Other Ambulatory Visit: Payer: Self-pay

## 2021-08-28 DIAGNOSIS — M79674 Pain in right toe(s): Secondary | ICD-10-CM

## 2021-08-28 DIAGNOSIS — B351 Tinea unguium: Secondary | ICD-10-CM | POA: Diagnosis not present

## 2021-08-28 DIAGNOSIS — M79675 Pain in left toe(s): Secondary | ICD-10-CM

## 2021-08-28 DIAGNOSIS — Z89419 Acquired absence of unspecified great toe: Secondary | ICD-10-CM

## 2021-08-28 DIAGNOSIS — E1149 Type 2 diabetes mellitus with other diabetic neurological complication: Secondary | ICD-10-CM

## 2021-08-29 NOTE — Progress Notes (Signed)
Subjective: [name] is a 66 y.o. is seen today for diabetic foot exam and to have his nails trimmed.  He states that he has noticed the second and the left foot turning in some.  He states that he did not wear the toe separator, shoe often.  He presents today wearing regular shoes.  No open sores.  No increase in swelling or redness.  No fevers or chills.  No other concerns.   Objective: General: No acute distress, AAOx3 -presents today wearing regular shoes DP/PT pulses palpable 2/4, CRT < 3 sec to all digits.  Left foot: Hammertoe contractures are present of the lesser digits on the left side.  Dried blood present on the left second and third digit toenails.  Upon debridement there is no underlying ulceration, drainage or signs of infection.  Nails in general hypertrophic, dystrophic with yellow discoloration. No pain with calf compression, erythema or warmth.  Assessment and Plan:  Symptomatic onychomycosis, onycholysis  -Treatment options discussed including all alternatives, risks, and complications -Sharply debrided the nails x9 without any complications or bleeding.  Recommend continue offloading.  If the insert, she is not comfortable to let us know and we can work on modifications for this. -Continue offloading pads, daily foot inspection, glucose control   Vivi Barrack DPM

## 2021-10-16 ENCOUNTER — Ambulatory Visit (INDEPENDENT_AMBULATORY_CARE_PROVIDER_SITE_OTHER): Payer: Medicare Other | Admitting: Podiatrist

## 2021-10-16 ENCOUNTER — Encounter: Payer: Self-pay | Admitting: Podiatrist

## 2021-10-16 DIAGNOSIS — M2042 Other hammer toe(s) (acquired), left foot: Secondary | ICD-10-CM | POA: Diagnosis not present

## 2021-10-16 DIAGNOSIS — L97521 Non-pressure chronic ulcer of other part of left foot limited to breakdown of skin: Secondary | ICD-10-CM

## 2021-10-16 MED ORDER — CEPHALEXIN 500 MG PO CAPS: 500.0000 mg | ORAL_CAPSULE | Freq: Three times a day (TID) | ORAL | 0 refills | Status: DC

## 2021-10-16 NOTE — Progress Notes (Signed)
?Chief Complaint  ?Patient presents with  ? Wound Check  ?  Left 2nd toe sore. Pt complains of a cracked open sore underneath left 2nd toe x 5 days. Bleeding only on day 1 of open sore. No drainage. Pt states he has been using neosporin.   ?  ? ?HPI: Patient is 66 y.o. male who presents today for a crack in the skin on the bottom of the left second toe.  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 He states it has been draining blood but no pus or pirulence. He put neosporin on the toe and covered with a bandaid.  ? ?Patient Active Problem List  ? Diagnosis Date Noted  ? Background diabetic retinopathy (HCC) 05/29/2021  ? Erectile dysfunction 05/29/2021  ? Hyperlipidemia 05/29/2021  ? Obesity 05/29/2021  ? Chest pain of uncertain etiology 12/16/2019  ? Fatigue 12/16/2019  ? Regular astigmatism, left eye 02/23/2019  ? Combined forms of age-related cataract of left eye 02/23/2019  ? 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 Benign essential hypertension 05/23/2013  ? ? ?Current Outpatient Medications on File Prior to Visit  ?Medication Sig Dispense Refill  ? amoxicillin (AMOXIL) 500 MG capsule 1 capsule    ? aspirin 81 MG chewable tablet 1 tablet    ? aspirin EC 81 MG tablet Take 1 tablet (81 mg total) by mouth daily. Swallow whole. 90 tablet 3  ? atorvastatin (LIPITOR) 20 MG tablet Take 20 mg by mouth daily.    ? B-D ULTRAFINE III SHORT PEN 31G X 8 MM MISC Inject 1 Syringe into the skin daily.    ? cephALEXin (KEFLEX) 500 MG capsule Take 1 capsule (500 mg total) by mouth 3 (three) times daily. 21 capsule 0  ? cyclobenzaprine (FLEXERIL) 10 MG tablet 1 tablet at bedtime as needed    ? HYDROcodone-acetaminophen (NORCO/VICODIN) 5-325 MG tablet Take 1-2 tablets by mouth every 6 (six) hours as needed. 15 tablet 0  ? lisinopril (ZESTRIL) 20 MG tablet 1 tablet    ?  lisinopril (ZESTRIL) 40 MG tablet Take 40 mg by mouth daily.    ? metFORMIN (GLUCOPHAGE-XR) 500 MG 24 hr tablet Take 1,000 mg by mouth 2 (two) times daily.    ? metoprolol tartrate (LOPRESSOR) 100 MG tablet TAKE 2 HOURS PRIOR TO CT SCAN (Patient not taking: No sig reported) 1 tablet 0  ? naproxen (EC NAPROSYN) 500 MG EC tablet 1 tablet    ? OZEMPIC, 0.25 OR 0.5 MG/DOSE, 2 MG/1.5ML SOPN Inject into the skin.    ? rosuvastatin (CRESTOR) 40 MG tablet Take 1 tablet (40 mg total) by mouth daily. (Patient not taking: Reported on 01/11/2021) 90 tablet 3  ? sitaGLIPtin-metformin (JANUMET) 50-1000 MG tablet 2 tablets    ? testosterone cypionate (DEPOTESTOSTERONE CYPIONATE) 200 MG/ML injection Inject 200 mg into the muscle once a week.    ? TRESIBA FLEXTOUCH 200 UNIT/ML FlexTouch Pen Inject into the skin.    ? ?No current facility-administered medications on file prior to visit.  ? ? ?Allergies  ?Allergen Reactions  ? Doxycycline   ? ? ?Review of Systems ?No fevers, chills, nausea, muscle aches, no difficulty breathing, no calf pain, no chest pain or shortness of breath. ? ? ?Physical Exam ? ?GENERAL APPEARANCE: Alert, conversant. Appropriately groomed. No acute distress.  ? ?VASCULAR: Pedal pulses palpable 2/4  DP and 1/4 PT left  Capillary refill time is immediate to all digits,  Proximal to distal cooling it warm to warm.  Digital perfusion adequate.  ? ?NEUROLOGIC: decreased epicritic and protective sensation to the left foot ? ?MUSCULOSKELETAL: amputation of the left hallux noted.  Contracture deformity of toes 2,3,4 noted left.  Non reducible in nature.  ? ?DERMATOLOGIC: fissured ulceration at the plantar crease of the distal interphalangeal joint of the left second toe is noted.  Bleeding and mild maceration of the fissure is seen.  No redness, no swelling, no pus or pirulance is noted.    ? ? ? ?Assessment  ? ?  ICD-10-CM   ?1. Ulcer of left second toe, limited to breakdown of skin (HCC)  L97.521   ?  ?2. Hammertoe of  second toe of left foot  M20.42   ?  ? ? ? ?Plan ? ?Discussed exam findings with the patient.  Discussed that it does appear that the increase at the distal tip of the toe has torn and an ulceration/fissure has formed.  It does not look infected however concern for potential infection is present and I am going to write him for Keflex 500 mg 1 tab p.o. 3 times daily for 10 days. ?I applied iodine on a 2 x 2 and wrapped the toe and gave instructions for him to do this same iodine dressing at home.  Surgical shoe was dispensed for his use.   ?He will be seen back in 1 week for recheck if he notices any increased redness, swelling or drainage she is instructed to call immediately. ?

## 2021-10-16 NOTE — Patient Instructions (Signed)
Cleanse your toe daily with soap and water.  Dry well. ? ?Apply a couple drops of iodine/betadine to a 2x2 gauze pad and wrap around the bottom of the toe.  Cover with either self stick wrap loosely or a bandaid. ? ?Wear your surgical shoe when up on your foot. ? ?I have called in keflex to your pharmacy. Take this medicine as directed. ? ?Call if you see any increased redness, swelling or if you notice any pus or worsening of the toe.  ?

## 2021-10-22 ENCOUNTER — Ambulatory Visit (INDEPENDENT_AMBULATORY_CARE_PROVIDER_SITE_OTHER): Payer: Medicare Other | Admitting: Podiatry

## 2021-10-22 DIAGNOSIS — L97521 Non-pressure chronic ulcer of other part of left foot limited to breakdown of skin: Secondary | ICD-10-CM | POA: Diagnosis not present

## 2021-10-22 DIAGNOSIS — M2042 Other hammer toe(s) (acquired), left foot: Secondary | ICD-10-CM

## 2021-10-22 NOTE — Progress Notes (Signed)
Subjective: ?66 year old male presents the office today for follow evaluation of a wound on the bottom of his left second toe.  He states that he has been using the Betadine the bottom of the toe and the wound to dry out but yesterday he had some bleeding but no bleeding today.  No increase in swelling or redness.  No pus.  He states that a couple weeks ago he stopped using the offloading pad he is also [place] and being on his feet more which may have aggravated his symptoms.  Denies any fevers or chills.  No other concerns. ? ?Objective: ?AAO x3, NAD ?DP/PT pulses palpable bilaterally, CRT less than 3 seconds ?Prior hallux amputation which is well-healed.  Hammertoe contracture present of the lesser digits most of the second digit.  Along the plantar IPJ is a transverse skin fissure lesion with a granular base without any exposed bone or tendon.  There is no drainage or pus or any bleeding today.  There is no significant edema, erythema to the digit.  No fluctuation or crepitation.  There is no malodor.  No open lesions or pre-ulcerative lesions.  ?No pain with calf compression, swelling, warmth, erythema ? ?Assessment: ?Ulceration left second toe due to hammertoe ? ?Plan: ?-All treatment options discussed with the patient including all alternatives, risks, complications.  ?-At this point the wound does not appear to be infected and there is no active bleeding.  Wound continue with a small mount of Betadine.  Dispensed a new toe crest.  We discussed offloading at all times.  Although we discussed flexor tenotomy to see to help straighten the digit.  We will consider this possibly next appointment but I like for the wound to heal prior to this in order to help decrease the risk of infection. ?-Patient encouraged to call the office with any questions, concerns, change in symptoms.  ? ? ?RTC 2-3 weeks or sooner if needed ? ?Vivi Barrack DPM ? ?

## 2021-10-23 ENCOUNTER — Ambulatory Visit: Payer: Medicare Other | Admitting: Podiatry

## 2021-11-08 ENCOUNTER — Ambulatory Visit (INDEPENDENT_AMBULATORY_CARE_PROVIDER_SITE_OTHER): Payer: Medicare Other | Admitting: Podiatry

## 2021-11-08 DIAGNOSIS — B351 Tinea unguium: Secondary | ICD-10-CM

## 2021-11-08 DIAGNOSIS — M79675 Pain in left toe(s): Secondary | ICD-10-CM

## 2021-11-08 DIAGNOSIS — M79674 Pain in right toe(s): Secondary | ICD-10-CM

## 2021-11-08 DIAGNOSIS — M2042 Other hammer toe(s) (acquired), left foot: Secondary | ICD-10-CM | POA: Diagnosis not present

## 2021-11-08 DIAGNOSIS — E1149 Type 2 diabetes mellitus with other diabetic neurological complication: Secondary | ICD-10-CM

## 2021-11-08 NOTE — Progress Notes (Signed)
Subjective: ?[name] is a 66 y.o. is seen today for diabetic foot exam and to have his nails trimmed.  States that the wound of the left second toe has been doing well and the toes actually started out after his been using the offloading pad.  Denies any ulcerations this time.  No swelling redness or any drainage. ? ?A1c 7.6 on May 14, 2021. ? ?Objective: ?General: No acute distress, AAOx3 -presents today wearing regular shoes ?DP/PT pulses palpable 2/4, CRT < 3 sec to all digits.  ?Left foot: Hammertoe contractures are present of the lesser digits on the left side.  Nails appear be hypertrophic, dystrophic with yellow, brown discoloration.  There is dried blood underneath the lesser digit nails on the left foot.  Nails are firmly here to the nail beds.  The wound that was present on the second toe has resolved.  No open lesions noted bilaterally.  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 ?No pain with calf compression, erythema or warmth. ? ?Assessment and Plan:  ?Symptomatic onychomycosis, hammertoes ? ?-Treatment options discussed including all alternatives, risks, and complications ?-Sharply debrided the nails x9 without any complications or bleeding.   ?-The wound on the left second toe has resolved.  Therefore we held on the flexor tenotomy today.  Continue with offloading pads and monitor for any reoccurrence.  Should anything recur then consider flexor tenotomy.   ?-Daily foot inspection, glucose control.   ? ?Return in about 9 weeks (around 01/10/2022). ? ?Vivi Barrack DPM ?

## 2021-11-27 ENCOUNTER — Ambulatory Visit: Payer: Medicare Other | Admitting: Podiatry

## 2022-01-15 ENCOUNTER — Ambulatory Visit: Payer: Medicare Other | Admitting: Podiatry

## 2022-04-04 ENCOUNTER — Ambulatory Visit (INDEPENDENT_AMBULATORY_CARE_PROVIDER_SITE_OTHER): Payer: Medicare Other | Admitting: Podiatry

## 2022-04-04 ENCOUNTER — Ambulatory Visit (INDEPENDENT_AMBULATORY_CARE_PROVIDER_SITE_OTHER): Payer: Medicare Other

## 2022-04-04 DIAGNOSIS — L97521 Non-pressure chronic ulcer of other part of left foot limited to breakdown of skin: Secondary | ICD-10-CM

## 2022-04-04 DIAGNOSIS — L97412 Non-pressure chronic ulcer of right heel and midfoot with fat layer exposed: Secondary | ICD-10-CM | POA: Diagnosis not present

## 2022-04-04 DIAGNOSIS — L03115 Cellulitis of right lower limb: Secondary | ICD-10-CM | POA: Diagnosis not present

## 2022-04-04 DIAGNOSIS — S91331A Puncture wound without foreign body, right foot, initial encounter: Secondary | ICD-10-CM

## 2022-04-06 NOTE — Progress Notes (Signed)
Subjective: 66 year old male presents the office today for a new complaint. He states that he previously had stepped on a nail that went through his shoe and into his heel.  This occurred on December 31, 2021 and he was seen at [facility].  He was placed on Cipro as well as Keflex which she is still taking.  He has no pain although he has neuropathy.  He has not seen any drainage or pus but he is concerned about the wound given his history of amputation of the left foot.  He denies any fevers or chills.  Last A1c that I can see is 7.6 on May 14, 2021  Objective: AAO x3, NAD DP/PT pulses palpable bilaterally, CRT less than 3 seconds On the plantar aspect of the right heel there is what appears to be hyperkeratotic tissue with central granular wound.  I was able to sharply debride the tissue down to granular tissue which measured 2 x 1.3 cm.  The area with a nail with a does probe approximate 1.5 cm in close to the calcaneus but I am not able to actually touch the bone today.  Prior to debridement the wound was smaller measuring 1 x 0.5 cm.  there is localized surrounding erythema without any ascending cellulitis.  There is no fluctuation or crepitation.  There is no malodor. No pain with calf compression, swelling, warmth, erythema   Assessment: Ulceration right heel from foreign body, localized cellulitis  Plan: -All treatment options discussed with the patient including all alternatives, risks, complications.  -X-rays were obtained and reviewed.  3 views were obtained today.  Not able to appreciate any fracture, osteomyelitis or soft tissue edema today. -Medically necessary wound debridement was performed today given the wound, infection.  I sharply debrided the wound today down to healthy, granular tissue utilizing for 312 with scalpel.  I debrided the wound down to healthy tissue to remove nonviable tissue in order promote wound healing.  I copiously irrigated the wound with saline.  Patient  was applied followed by dressing recommend continue with daily dressing changes. -Cam boot dispensed for offloading -Continue antibiotics -Discussed that he is to closely monitor this as well as the cellulitis and if this were to worsen he is to report directly to the emergency room for IV antibiotics. -Patient encouraged to call the office with any questions, concerns, change in symptoms.   Trula Slade DPM

## 2022-04-08 ENCOUNTER — Ambulatory Visit (INDEPENDENT_AMBULATORY_CARE_PROVIDER_SITE_OTHER): Payer: Medicare Other | Admitting: Podiatry

## 2022-04-08 DIAGNOSIS — L97412 Non-pressure chronic ulcer of right heel and midfoot with fat layer exposed: Secondary | ICD-10-CM | POA: Diagnosis not present

## 2022-04-08 DIAGNOSIS — L03115 Cellulitis of right lower limb: Secondary | ICD-10-CM | POA: Diagnosis not present

## 2022-04-08 DIAGNOSIS — S91331D Puncture wound without foreign body, right foot, subsequent encounter: Secondary | ICD-10-CM | POA: Diagnosis not present

## 2022-04-08 MED ORDER — CEPHALEXIN 500 MG PO CAPS: 500.0000 mg | ORAL_CAPSULE | Freq: Three times a day (TID) | ORAL | 0 refills | Status: DC

## 2022-04-08 MED ORDER — CIPROFLOXACIN HCL 500 MG PO TABS: 500.0000 mg | ORAL_TABLET | Freq: Two times a day (BID) | ORAL | 0 refills | Status: DC

## 2022-04-10 ENCOUNTER — Ambulatory Visit
Admission: RE | Admit: 2022-04-10 | Discharge: 2022-04-10 | Disposition: A | Discharge status: Home / Self Care | Payer: Medicare Other | Source: Ambulatory Visit | Attending: Podiatry | Admitting: Podiatry

## 2022-04-10 DIAGNOSIS — L97412 Non-pressure chronic ulcer of right heel and midfoot with fat layer exposed: Secondary | ICD-10-CM

## 2022-04-10 MED ORDER — GADOPICLENOL 0.5 MMOL/ML IV SOLN
10.0000 mL | Freq: Once | INTRAVENOUS | Status: AC | PRN
  Administered 2022-04-10: 10 mL via INTRAVENOUS

## 2022-04-15 NOTE — Progress Notes (Signed)
Subjective: Chief Complaint  Patient presents with   Diabetic Ulcer    Patient came in today for diabetic ulcer right heel, patient denies any pain, there is some drainage and bleeding, TX: Cam Boot, Keflex,     66 year old male presents the office today for follow-up evaluation of a right heel wound.  He states he still having some drainage and bleeding but no pus.  Swelling redness is improved.  Still on antibiotics.  Denies any fevers or chills.  Last A1c that I can see is 7.6 on May 14, 2021  Objective: AAO x3, NAD DP/PT pulses palpable bilaterally, CRT less than 3 seconds On the plantar aspect of the right heel granular wound is present and there is still one area that probes but not down to the bone but it is close.  Still measures about the same in size at 2 x 1.3 cm.  The edema and erythema around the wound has improved.  Small mount of necrotic tissue present on the central aspect of the wrist probing of the wound.  There is no fluctuation or crepitation.  There is no malodor. No pain with calf compression, swelling, warmth, erythema   Assessment: Ulceration right heel from foreign body, localized cellulitis  Plan: -All treatment options discussed with the patient including all alternatives, risks, complications.  -Medically necessary wound debridement was performed today given the wound, infection.  I sharply debrided the wound today down to healthy, granular tissue utilizing for 312 with scalpel.  I debrided the wound down to healthy tissue to remove nonviable tissue in order promote wound healing.  Tension was mostly along the central aspect of there is probing some necrotic tissue.  I was able to debride this to bleeding tissue.  Minimal blood loss and hemostasis achieved through manual compression.  I copiously irrigated the wound with saline.  Patient was applied followed by dressing recommend continue with daily dressing changes. -Cam boot dispensed for  offloading -Continue antibiotics -"Surgical debridement of the wound if needed.  We will order MRI to rule out any underlying pathology including abscess. -Patient encouraged to call the office with any questions, concerns, change in symptoms.   Trula Slade DPM

## 2022-04-16 ENCOUNTER — Ambulatory Visit (INDEPENDENT_AMBULATORY_CARE_PROVIDER_SITE_OTHER): Payer: Medicare Other | Admitting: Podiatry

## 2022-04-16 DIAGNOSIS — E1149 Type 2 diabetes mellitus with other diabetic neurological complication: Secondary | ICD-10-CM

## 2022-04-16 DIAGNOSIS — L97412 Non-pressure chronic ulcer of right heel and midfoot with fat layer exposed: Secondary | ICD-10-CM | POA: Diagnosis not present

## 2022-04-16 DIAGNOSIS — M79675 Pain in left toe(s): Secondary | ICD-10-CM

## 2022-04-16 DIAGNOSIS — B351 Tinea unguium: Secondary | ICD-10-CM | POA: Diagnosis not present

## 2022-04-16 DIAGNOSIS — M79674 Pain in right toe(s): Secondary | ICD-10-CM | POA: Diagnosis not present

## 2022-04-16 NOTE — Progress Notes (Signed)
Subjective: Chief Complaint  Patient presents with   Foot Ulcer    Left foot heel pain, some drainage, Nail trim      66 year old male presents the office today for follow-up evaluation of a right heel wound.  States he is doing better.  Some bloody drainage but no pus.  He is still on antibiotics.  Denies any fevers or chills.    Also asking the nails be trimmed as are thickened elongated difficulty trimming himself.    No new open lesions.  Last A1c that I can see is 7.6 on May 14, 2021  Objective: AAO x3, NAD DP/PT pulses palpable bilaterally, CRT less than 3 seconds On the plantar aspect of the right heel granular wound is present and there is still one area that probes but not down to the bone but it is close and appears to be filling in.  Still measures about the same in size at 2 x 1.3 x 1 cm.  On the area where probes there was some necrotic fat tissue present but there is no purulence.  There is no probing to bone.  Mild edema still noted but there is no cellulitis.  No fluctuation or crepitation vaginally. Nails are hypertrophic, dystrophic, brittle, discolored, elongated 9. No surrounding redness or drainage. 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 No open lesions or pre-ulcerative lesions are identified today. No pain with calf compression, swelling, warmth, erythema   Assessment: Ulceration right heel from foreign body, localized cellulitis with improvement; symptomatic onychomycosis  Plan: -All treatment options discussed with the patient including all alternatives, risks, complications.  -Reviewed the MRI. -Medically necessary wound debridement was performed today given the wound, infection.  I sharply debrided the wound today down to healthy, granular tissue utilizing for 312 with scalpel.  I debrided the wound down to healthy tissue to remove nonviable tissue in order promote wound healing.  This was to the level of the fat  layer.  Although the wound itself was not large in diameter was able to remove quite a bit of nonviable fat tissue present where the puncture wound was at.  Tension was mostly along the central aspect of there is probing some necrotic tissue.  I was able to debride this to bleeding tissue.  Minimal blood loss and hemostasis achieved through manual compression.  I copiously irrigated the wound with saline.  Patient was applied followed by dressing recommend continue with daily dressing changes. -Cam boot dispensed for offloading -Continue antibiotics -We discussed surgical intervention was able to debride the wound quite well today in the office.  I irrigated with saline.  Continue with daily dressing changes.  The silver dressing at home from prior that he can use. -Sharply debrided nails x9 without any complications or bleeding -Patient encouraged to call the office with any questions, concerns, change in symptoms.   Trula Slade DPM

## 2022-04-19 ENCOUNTER — Other Ambulatory Visit: Payer: Self-pay | Admitting: Podiatry

## 2022-04-19 ENCOUNTER — Telehealth: Payer: Self-pay | Admitting: Podiatry

## 2022-04-19 ENCOUNTER — Ambulatory Visit (HOSPITAL_COMMUNITY)
Admission: RE | Admit: 2022-04-19 | Discharge: 2022-04-19 | Disposition: A | Discharge status: Home / Self Care | Payer: Medicare Other | Source: Ambulatory Visit | Attending: Cardiovascular Disease | Admitting: Cardiovascular Disease

## 2022-04-19 DIAGNOSIS — L97412 Non-pressure chronic ulcer of right heel and midfoot with fat layer exposed: Secondary | ICD-10-CM | POA: Diagnosis present

## 2022-04-19 MED ORDER — CIPROFLOXACIN HCL 500 MG PO TABS: 500.0000 mg | ORAL_TABLET | Freq: Two times a day (BID) | ORAL | 0 refills | Status: DC

## 2022-04-19 MED ORDER — CEPHALEXIN 500 MG PO CAPS: 500.0000 mg | ORAL_CAPSULE | Freq: Three times a day (TID) | ORAL | 0 refills | Status: DC

## 2022-04-19 NOTE — Telephone Encounter (Signed)
Pharmacy [facility]  in [facility]   Medication: needs antibiotics called in

## 2022-04-29 ENCOUNTER — Ambulatory Visit (INDEPENDENT_AMBULATORY_CARE_PROVIDER_SITE_OTHER): Payer: Medicare Other | Admitting: Podiatry

## 2022-04-29 DIAGNOSIS — L97412 Non-pressure chronic ulcer of right heel and midfoot with fat layer exposed: Secondary | ICD-10-CM

## 2022-04-29 MED ORDER — SANTYL 250 UNIT/GM EX OINT: 1.0000 | TOPICAL_OINTMENT | Freq: Every day | CUTANEOUS | 1 refills | Status: AC

## 2022-05-01 NOTE — Progress Notes (Signed)
Subjective: Chief Complaint  Patient presents with   Foot Ulcer    Right heel ulcer, drainage, patient denies any pain  TX: Keflex (last dose today)   66 year old male presents the office today for follow-up evaluation of a right heel wound.  Still the antibiotics have about finished.  Feels that the wound is doing better.  No pus.  Denies any fevers or chills and no new concerns.   Last A1c that I can see is 7.6 on May 14, 2021  Objective: AAO x3, NAD DP/PT pulses palpable bilaterally, CRT less than 3 seconds On the plantar aspect of the right heel granular wound is present and there is still one area that probes but not down to the bone but it is close and appears to be filling in.  Prior to wound debridement the wound measured 1.5 x 1.1 x 0.8 cm and after debridement the wound was larger measuring 1.7 x 2 x 1 cm.  It is about the same in size but it does seem to be filling in.  There is 1 area that probes 1 cm of the wrist that is much more superficial.  Not able to get any drainage or pus out of this today.  There is 1 area of necrotic,fatty tissue present and has sharply debrided this down to healthy, bleeding tissue. No pain with calf compression, swelling, warmth, erythema       Assessment: Ulceration right heel from foreign body, localized cellulitis with improvement; symptomatic onychomycosis  Plan: -All treatment options discussed with the patient including all alternatives, risks, complications.  -Reviewed the MRI. -Medically necessary wound debridement was performed today given the wound, infection.  I sharply debrided the wound today down to healthy, granular tissue utilizing for 312 with scalpel.  I debrided the wound down to healthy tissue to remove nonviable tissue in order promote wound healing.  This was to the level of the fat layer.  Although the wound itself was not large in diameter was able to remove nonviable fat tissue present where the puncture wound was at.   I debrided down to healthy, viable tissue.  I was able to debride this to bleeding tissue.  Minimal blood loss and hemostasis achieved through manual compression.  I copiously irrigated the wound with saline.  Patient was applied followed by dressing recommend continue with daily dressing changes. -Cam boot for offloading -Continue antibiotics -We discussed surgical intervention was able to debride the wound quite well today in the office.  I irrigated with saline.  Continue with daily dressing changes.  The silver dressing at home from prior that he can use. -Patient encouraged to call the office with any questions, concerns, change in symptoms.   Trula Slade DPM

## 2022-05-07 ENCOUNTER — Ambulatory Visit (INDEPENDENT_AMBULATORY_CARE_PROVIDER_SITE_OTHER): Payer: Medicare Other | Admitting: Podiatry

## 2022-05-07 DIAGNOSIS — L97412 Non-pressure chronic ulcer of right heel and midfoot with fat layer exposed: Secondary | ICD-10-CM | POA: Diagnosis not present

## 2022-05-07 NOTE — Progress Notes (Unsigned)
Subjective: No chief complaint on file.   66 year old male presents the office today for follow-up evaluation of a right heel wound.  He states he is doing much better.  He thinks the area is drying up nicely.  He is continue daily dressing changes.  Not able to get the Santyl.  Denies any drainage or pus.  He has no fevers or chills.   Last A1c that I can see is 7.6 on May 14, 2021  Objective: AAO x3, NAD-wearing regular shoes DP/PT pulses palpable bilaterally, CRT less than 3 seconds On the plantar aspect of the right heel granular wound is present and there is still one area that probes but not down to the bone but it is close and appears to be filling in.  Prior debridement the wound was 6.1 x 1 x 0.5 cm with hyperkeratotic periwound.  After I debrided wound today is 1.2 x 1 x 0.6 cm without any probing, amount or tunneling otherwise.  No probing to bone.  Hyperkeratotic periwound.  No surrounding erythema, ascending cellulitis.  There is no fluctuance or crepitation.  No malodor. No pain with calf compression, swelling, warmth, erythema  Assessment: Ulceration right heel from foreign body, localized cellulitis with improvement  Plan: -All treatment options discussed with the patient including all alternatives, risks, complications.  -Medically necessary wound debridement was performed today given the wound, infection.  I sharply debrided the wound today down to healthy, granular tissue utilizing for 312 with scalpel.  I debrided the wound down to healthy tissue to remove nonviable tissue in order promote wound healing.  This was to the level of the fat layer.  Although the wound itself was not large in diameter was able to remove nonviable fat tissue present where the puncture wound was at.  I debrided down to healthy, viable tissue.  I was able to debride this to bleeding tissue.  Minimal blood loss and hemostasis achieved through manual compression.  I copiously irrigated the wound  with saline.  Patient was applied followed by dressing recommend continue with daily dressing changes. -Continue offloading -Patient encouraged to call the office with any questions, concerns, change in symptoms.   Trula Slade DPM   1.2 x 1 0.6

## 2022-05-28 ENCOUNTER — Ambulatory Visit (INDEPENDENT_AMBULATORY_CARE_PROVIDER_SITE_OTHER): Payer: Medicare Other | Admitting: Podiatry

## 2022-05-28 DIAGNOSIS — L97412 Non-pressure chronic ulcer of right heel and midfoot with fat layer exposed: Secondary | ICD-10-CM | POA: Diagnosis not present

## 2022-05-28 NOTE — Progress Notes (Unsigned)
Subjective: Chief Complaint  Patient presents with   Diabetes    Diabteic heel ulcer, A1c- 7.8 BG- 167, heel is having some drainage, Patient denies any pain,      66 year old male presents the office today for follow-up evaluation of a right heel wound.  He states he is doing much better.  He is been keeping a dressing on the wound.  No significant drainage or pus.  No concerns plantar redness.  Denies any fevers or chills.  No other concerns today.    Last A1c that I can see is 7.6 on May 14, 2021  Objective: AAO x3, NAD-wearing regular shoes DP/PT pulses palpable bilaterally, CRT less than 3 seconds On the plantar medial aspect of the right heel granular wound with hyperkeratotic periwound.  After debridement the wound measured 1 x 0.6 x 0.4 centimeters.  Pre and post measurements about the same.  There is no probing to bone, and or tunneling.  There is no surrounding erythema, ascending cellulitis.  No fluctuance or crepitation.  No malodor. No pain with calf compression, swelling, warmth, erythema    Assessment: Ulceration right heel from foreign body, localized cellulitis with improvement  Plan: -All treatment options discussed with the patient including all alternatives, risks, complications.  -Medically necessary wound debridement was performed today given the wound, infection.  I sharply debrided the wound today down to healthy, granular tissue utilizing for 312 with scalpel.  I debrided the wound down to healthy tissue to remove nonviable tissue in order promote wound healing.  This was to the level of the fat layer.  Pre and post wound measurements were the same.  Debrided down to healthy, bleeding tissue. Minimal blood loss and hemostasis achieved through manual compression.  I copiously irrigated the wound with saline.  Patient was applied followed by dressing recommend continue with daily dressing changes.  He is to continue with the silver alginate which he has at  home. -Continue offloading -Patient encouraged to call the office with any questions, concerns, change in symptoms.   Vivi Barrack DPM

## 2022-06-11 ENCOUNTER — Ambulatory Visit (INDEPENDENT_AMBULATORY_CARE_PROVIDER_SITE_OTHER): Payer: Medicare Other | Admitting: Podiatry

## 2022-06-11 VITALS — BP 130/72

## 2022-06-11 DIAGNOSIS — M79675 Pain in left toe(s): Secondary | ICD-10-CM | POA: Diagnosis not present

## 2022-06-11 DIAGNOSIS — B351 Tinea unguium: Secondary | ICD-10-CM

## 2022-06-11 DIAGNOSIS — M79674 Pain in right toe(s): Secondary | ICD-10-CM

## 2022-06-11 DIAGNOSIS — E1149 Type 2 diabetes mellitus with other diabetic neurological complication: Secondary | ICD-10-CM

## 2022-06-11 DIAGNOSIS — L97412 Non-pressure chronic ulcer of right heel and midfoot with fat layer exposed: Secondary | ICD-10-CM

## 2022-06-11 NOTE — Progress Notes (Signed)
Subjective: Chief Complaint  Patient presents with   Foot Ulcer    Right heel ulcer       66 year old male presents the office today for follow-up evaluation of a right heel wound.  States the wound is continuing to heal.  Not seeing any purulence but only some minimal bloody drainage on the bandage.  Denies any fevers or chills.  Asking for the nails to be trimmed as are thickened elongated causing discomfort  Last A1c that I can see is 7.6 on May 14, 2021  Objective: AAO x3, NAD-wearing regular shoes DP/PT pulses palpable bilaterally, CRT less than 3 seconds Nails are hypertrophic, dystrophic nail discoloration.  There is tenderness to the nails and they are elongated.  The nails affected are 2 through 5 on the left and 1 through 5 on the right. On the plantar medial aspect of the right heel granular wound with hyperkeratotic periwound.  After debridement the wound measured 1 x 0.5 x 0.3 centimeters.  Prior to debridement the wound was slightly smaller at 0.8 x 0.4 x 0.2 cm as there is hyperkeratotic periwound.  There is no probing to bone, and or tunneling.  There is no surrounding erythema, ascending cellulitis.  No fluctuance or crepitation.  No malodor. No pain with calf compression, swelling, warmth, erythema    Assessment: Ulceration right heel from foreign body, localized cellulitis with improvement; symptomatic onychomycosis  Plan: -All treatment options discussed with the patient including all alternatives, risks, complications.  -Medically necessary wound debridement was performed today given the wound, infection.  I sharply debrided the wound today down to healthy, granular tissue utilizing for 312 with scalpel.  I debrided the wound down to healthy tissue to remove nonviable tissue in order promote wound healing.  This was to the level of the fat layer.  Pre and post wound measurements were the same.  Debrided down to healthy, bleeding tissue. Minimal blood loss and  hemostasis achieved through manual compression.  I copiously irrigated the wound with saline.  Patient was applied followed by dressing recommend continue with daily dressing changes.  He is to continue with the silver alginate which he has at home. -Continue offloading -Sharply debrided the nails times now without any complications or bleeding. -Patient encouraged to call the office with any questions, concerns, change in symptoms.   Vivi Barrack DPM

## 2022-07-07 IMAGING — MR MR FOOT*L* WO/W CM
9 series · 40 of 40 positions shown · IV contrast (10 GADAVIST)
Comparison: Radiographs 12/23/2018 and 01/26/2020. No recent
imaging available.

CLINICAL DATA: Diabetic foot ulcer. Reported ulcer of toe of left
foot with necrosis of bone.

EXAM:
MRI OF THE LEFT FOREFOOT WITHOUT AND WITH CONTRAST
TECHNIQUE: Multiplanar, multisequence MR imaging of the left forefoot was
performed both before and after administration of intravenous
contrast.
CONTRAST:  10mL GADAVIST GADOBUTROL 1 MMOL/ML IV SOLN

[Series 4: T2 fat-sat · coronal · left · 3.0mm · 0.38mm/px · 5 of 45 slices shown (1 of 2)]
[im 1/45]
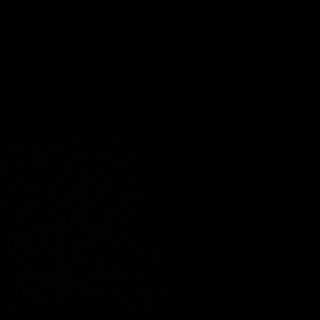
[im 12/45]
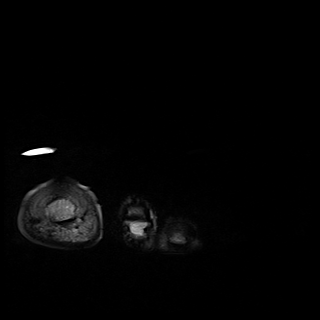
[im 23/45]
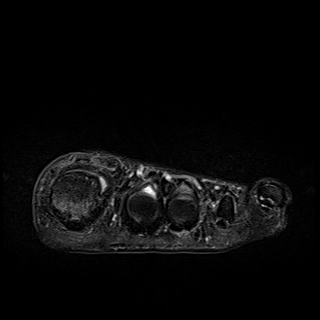
[im 34/45]
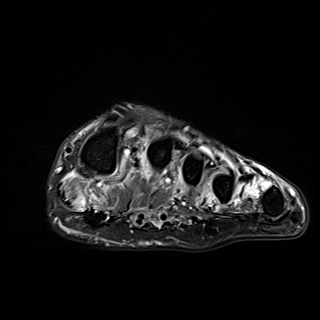
[im 45/45]
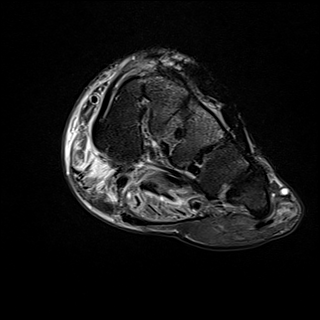

[Series 5: T1 · coronal · left · 3.0mm · 0.47mm/px · 6 of 43 slices shown (1 of 2)]
[im 1/43]
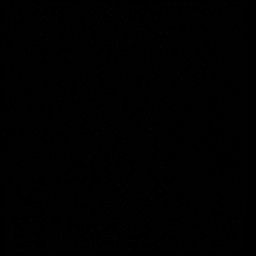
[im 9/43]
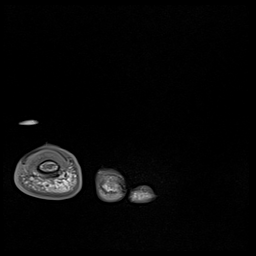
[im 17/43]
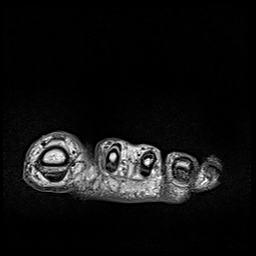
[im 26/43]
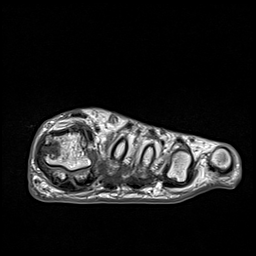
[im 34/43]
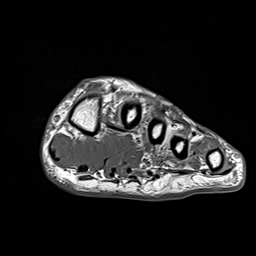
[im 43/43]
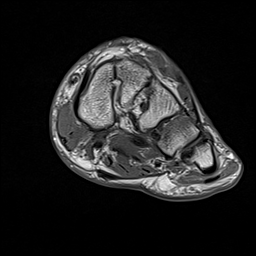

[Series 6: T2 fat-sat · axial · left · 3.0mm · 0.70mm/px · z∈[-38,+40]mm · 3 of 24 slices shown (2 of 2)]
[im 1/24]
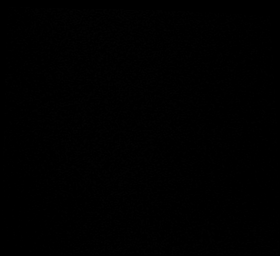
[im 12/24]
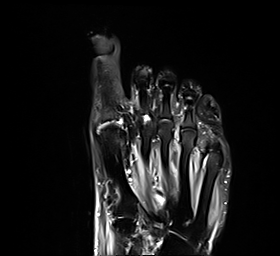
[im 24/24]
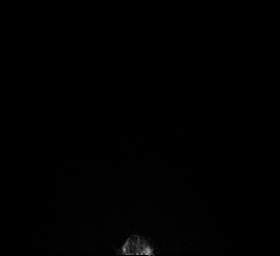

[Series 7: T1 · axial · left · 3.0mm · 0.70mm/px · z∈[-36,+43]mm · 3 of 24 slices shown (2 of 2)]
[im 1/24]
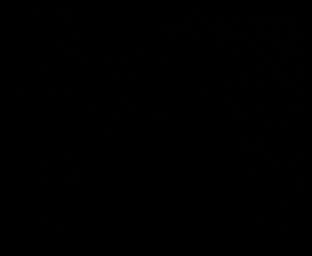
[im 12/24]
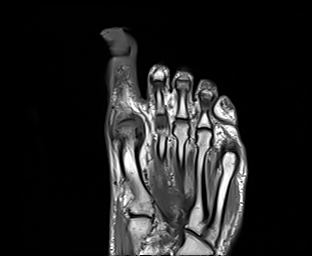
[im 24/24]
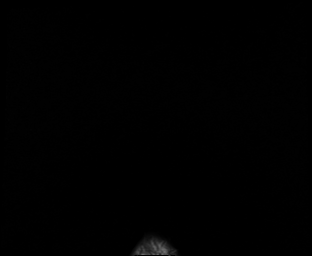

[Series 8: STIR · sagittal · left · 3.0mm · 0.35mm/px · 4 of 32 slices shown]
[im 1/32]
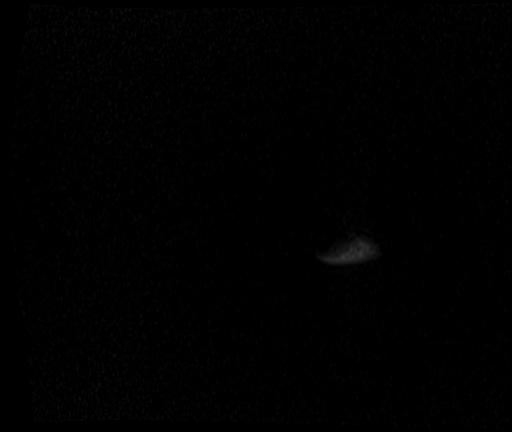
[im 11/32]
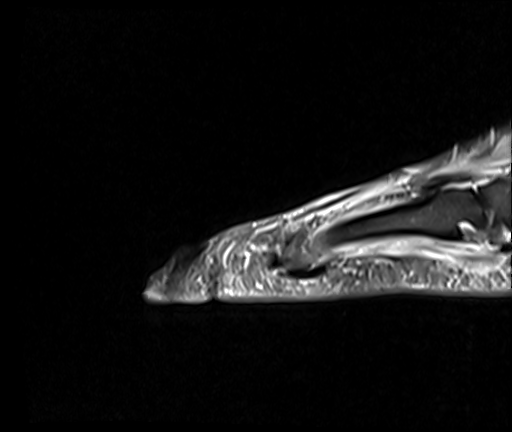
[im 21/32]
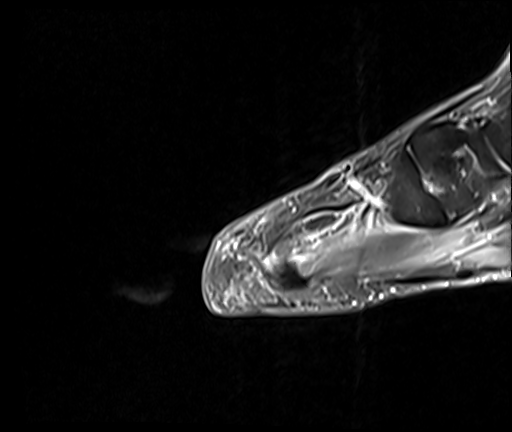
[im 32/32]
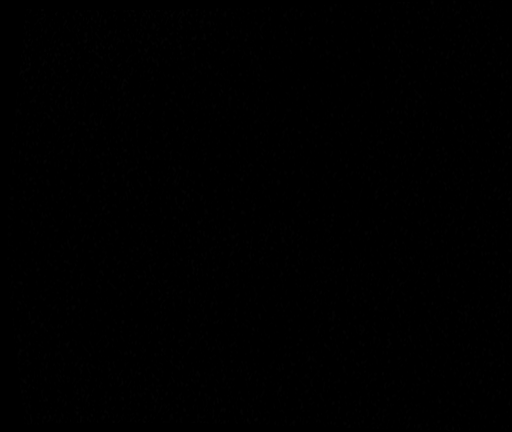

[Series 9: T1 fat-sat · coronal · non-contrast · left · 3.0mm · 0.47mm/px · 6 of 44 slices shown]
[im 1/44]
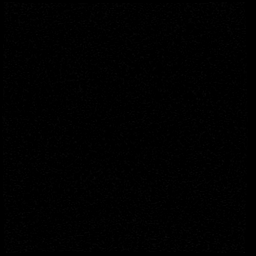
[im 9/44]
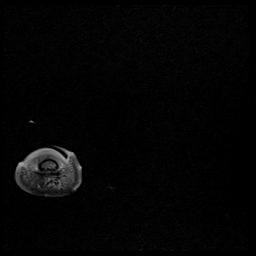
[im 18/44]
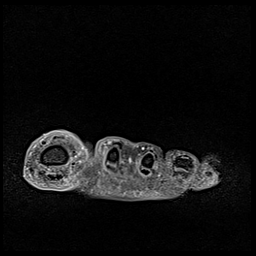
[im 26/44]
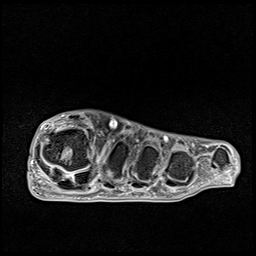
[im 35/44]
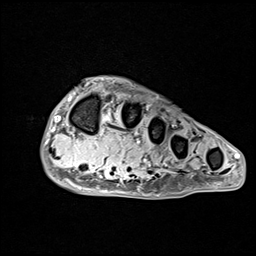
[im 44/44]
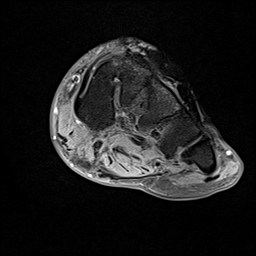

[Series 10: T1 fat-sat post-contrast · coronal · left · 3.0mm · 0.47mm/px · 6 of 43 slices shown (1 of 3)]
[im 1/43]
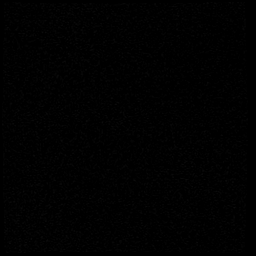
[im 9/43]
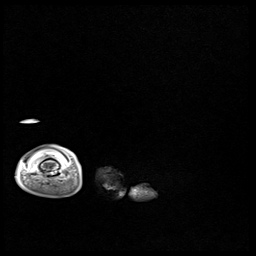
[im 17/43]
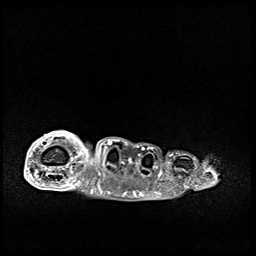
[im 26/43]
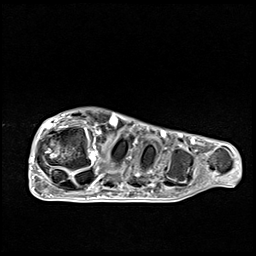
[im 34/43]
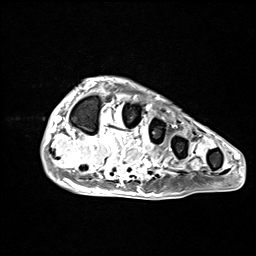
[im 43/43]
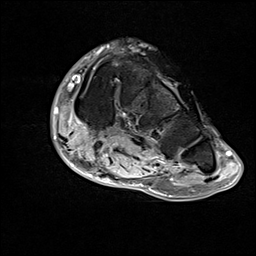

[Series 11: T1 fat-sat post-contrast · sagittal · left · 3.0mm · 0.35mm/px · 4 of 31 slices shown (2 of 3)]
[im 1/31]
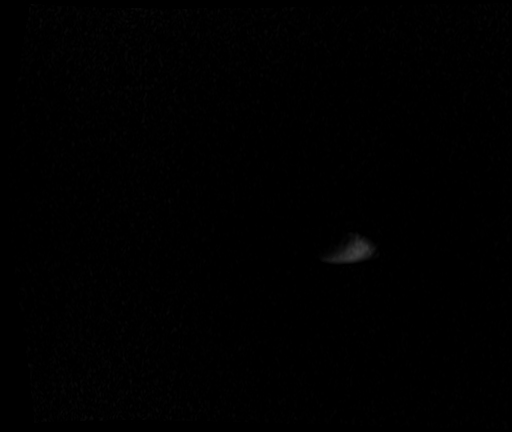
[im 11/31]
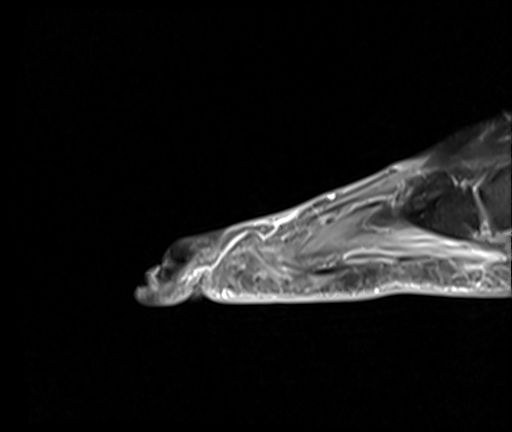
[im 21/31]
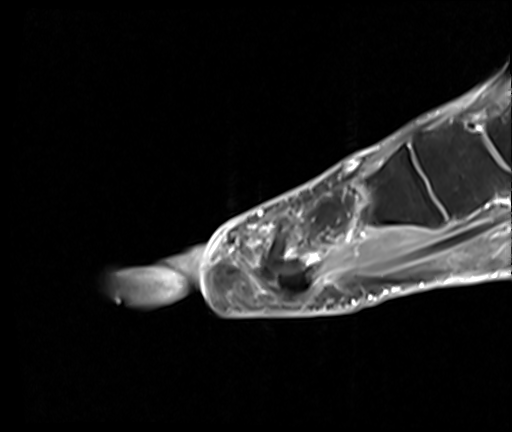
[im 31/31]
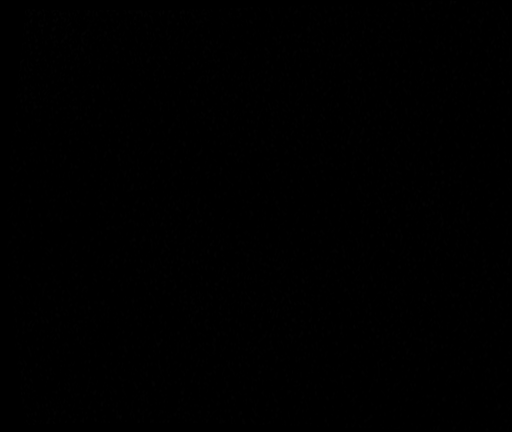

[Series 12: T1 fat-sat post-contrast · axial · left · 3.0mm · 0.56mm/px · z∈[-40,+38]mm · 3 of 24 slices shown (3 of 3)]
[im 1/24]
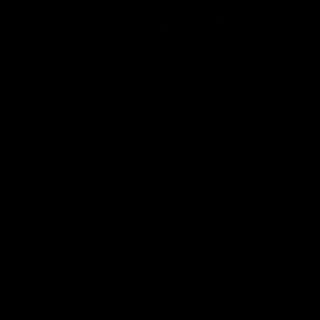
[im 12/24]
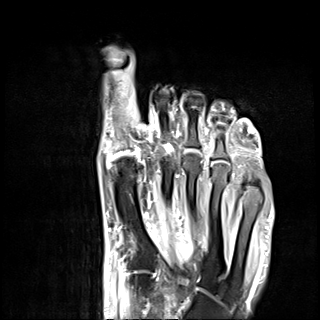
[im 24/24]
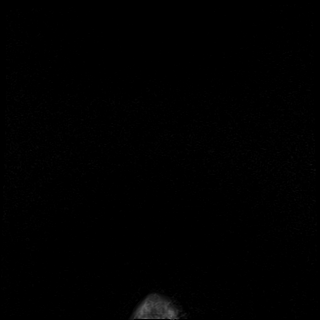

[40 of 40 positions shown; findings below may reference images not displayed]

FINDINGS: Bones/Joint/Cartilage

A marker was placed over the dorsal aspect of the distal great toe
to indicate the area of ulceration. There is abnormal marrow T2
hyperintensity and enhancement throughout the distal phalanx of the
great toe. T1 marrow signal is mildly decreased. No gross cortical
destruction identified, although assessment is mildly limited by
artifact in this area. There is no significant interphalangeal joint
effusion. There is subchondral cyst formation medially in the head
of the proximal phalanx and within the head of the 1st metatarsal.
There are underlying mild degenerative changes at the 1st
metatarsophalangeal joint. The additional digits and metatarsals
appear unremarkable.

Ligaments

The collateral ligaments of the metatarsophalangeal joints appear
intact.

Muscles and Tendons

The tendons and muscles within the forefoot appear unremarkable.

Soft tissues

Soft tissue ulceration along the distal and dorsal aspects of the
great toe involving the nail bed. There is heterogeneous T2 signal
and enhancement in this area without focal fluid collection. The
soft tissue changes abut the abnormal distal phalanx, making
osteomyelitis likely.
IMPRESSION: 1. Soft tissue ulceration distally in the great toe with abnormal
marrow signal and enhancement in the distal phalanx consistent with
osteomyelitis.
2. No involvement of the proximal phalanx or 1st metatarsal.
Underlying degenerative changes and subchondral cysts in those toes
bones.
3. No focal soft tissue abscess identified.

## 2022-07-11 ENCOUNTER — Ambulatory Visit: Payer: Medicare Other | Admitting: Podiatry

## 2022-07-20 IMAGING — DX DG FOOT COMPLETE 3+V*L*
3 series · 3 of 3 positions shown · non-contrast
Comparison: Radiograph 01/11/2021

CLINICAL DATA: Postop.  Amputation of first toe.

EXAM:
LEFT FOOT - COMPLETE 3+ VIEW

[foot ap]
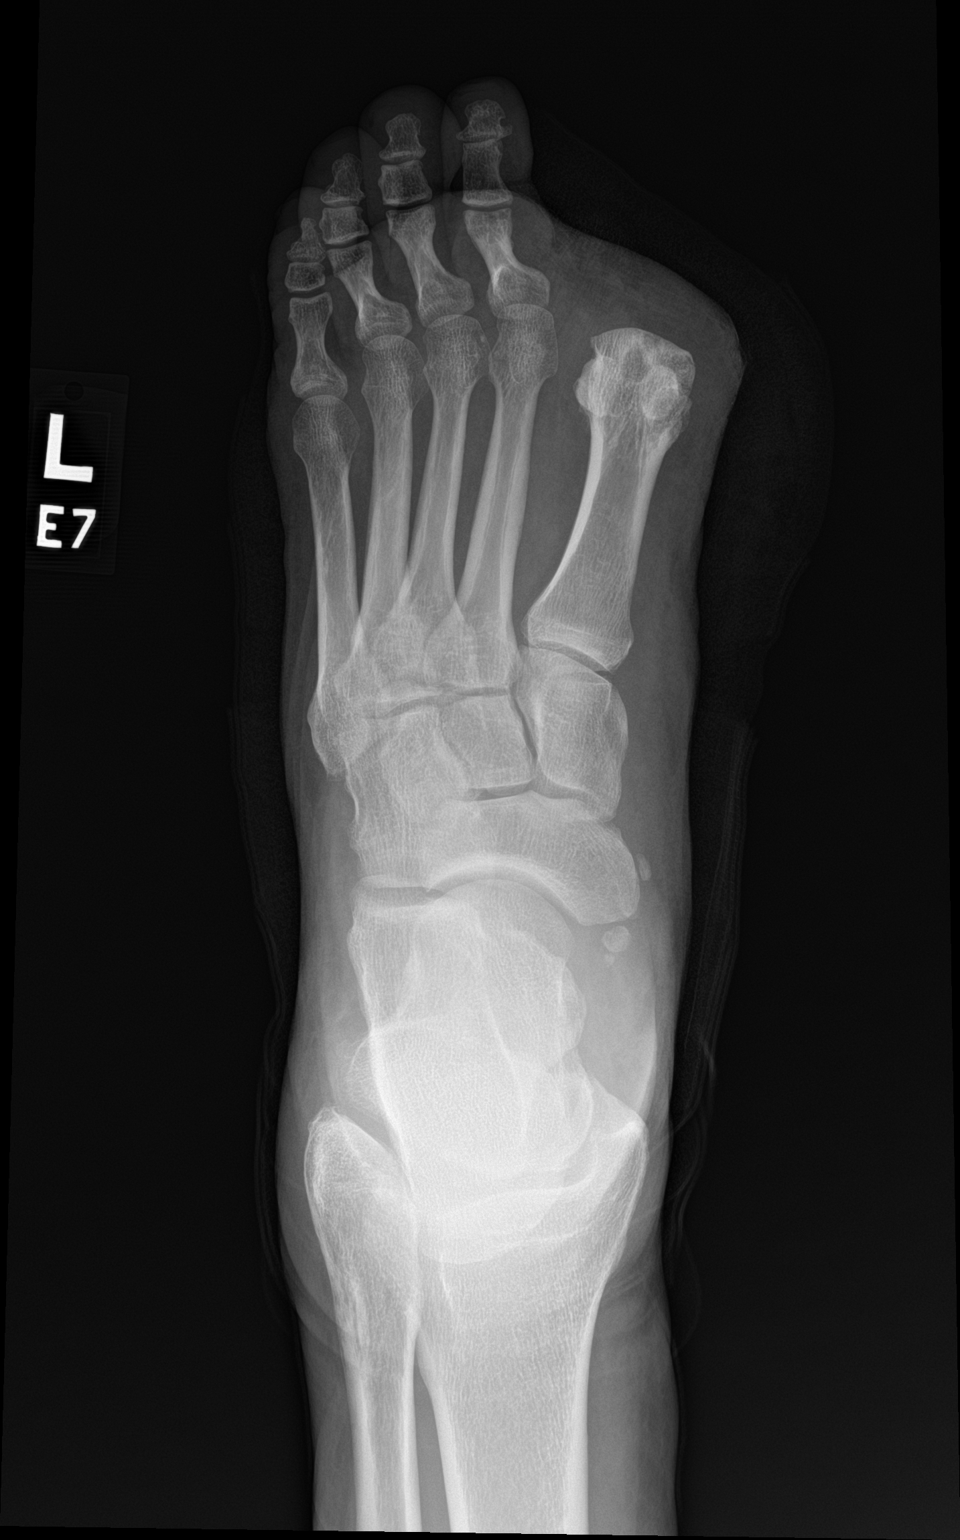

[foot obl]
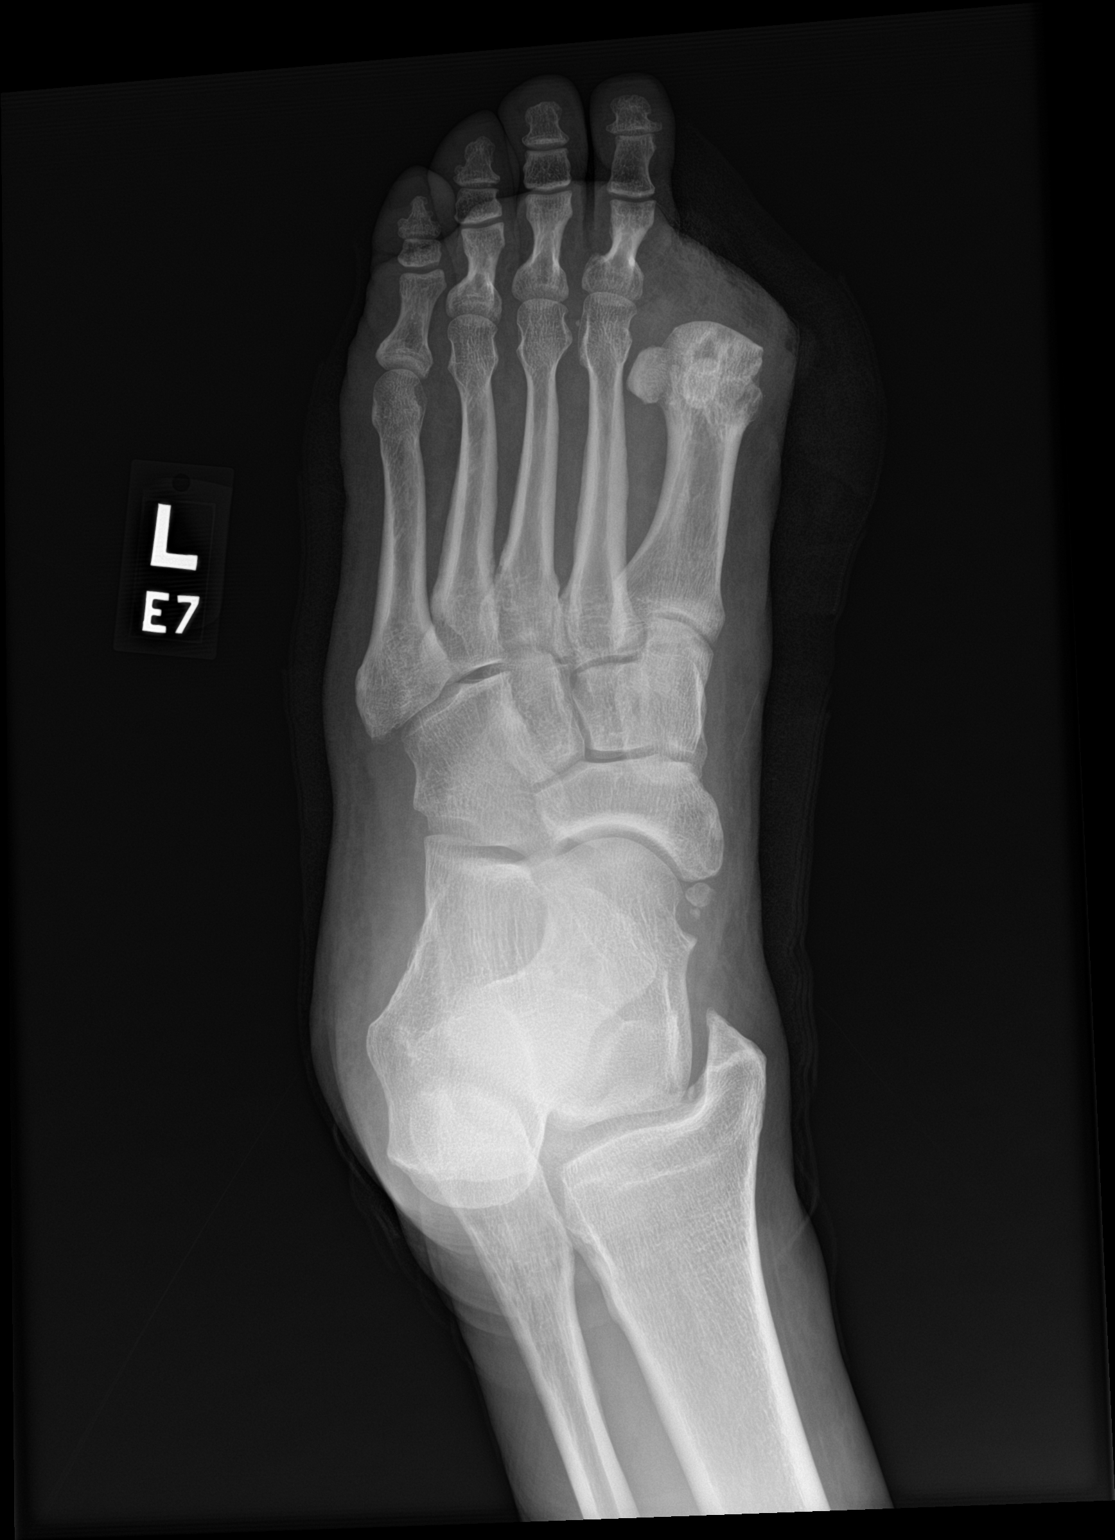

[foot lat]
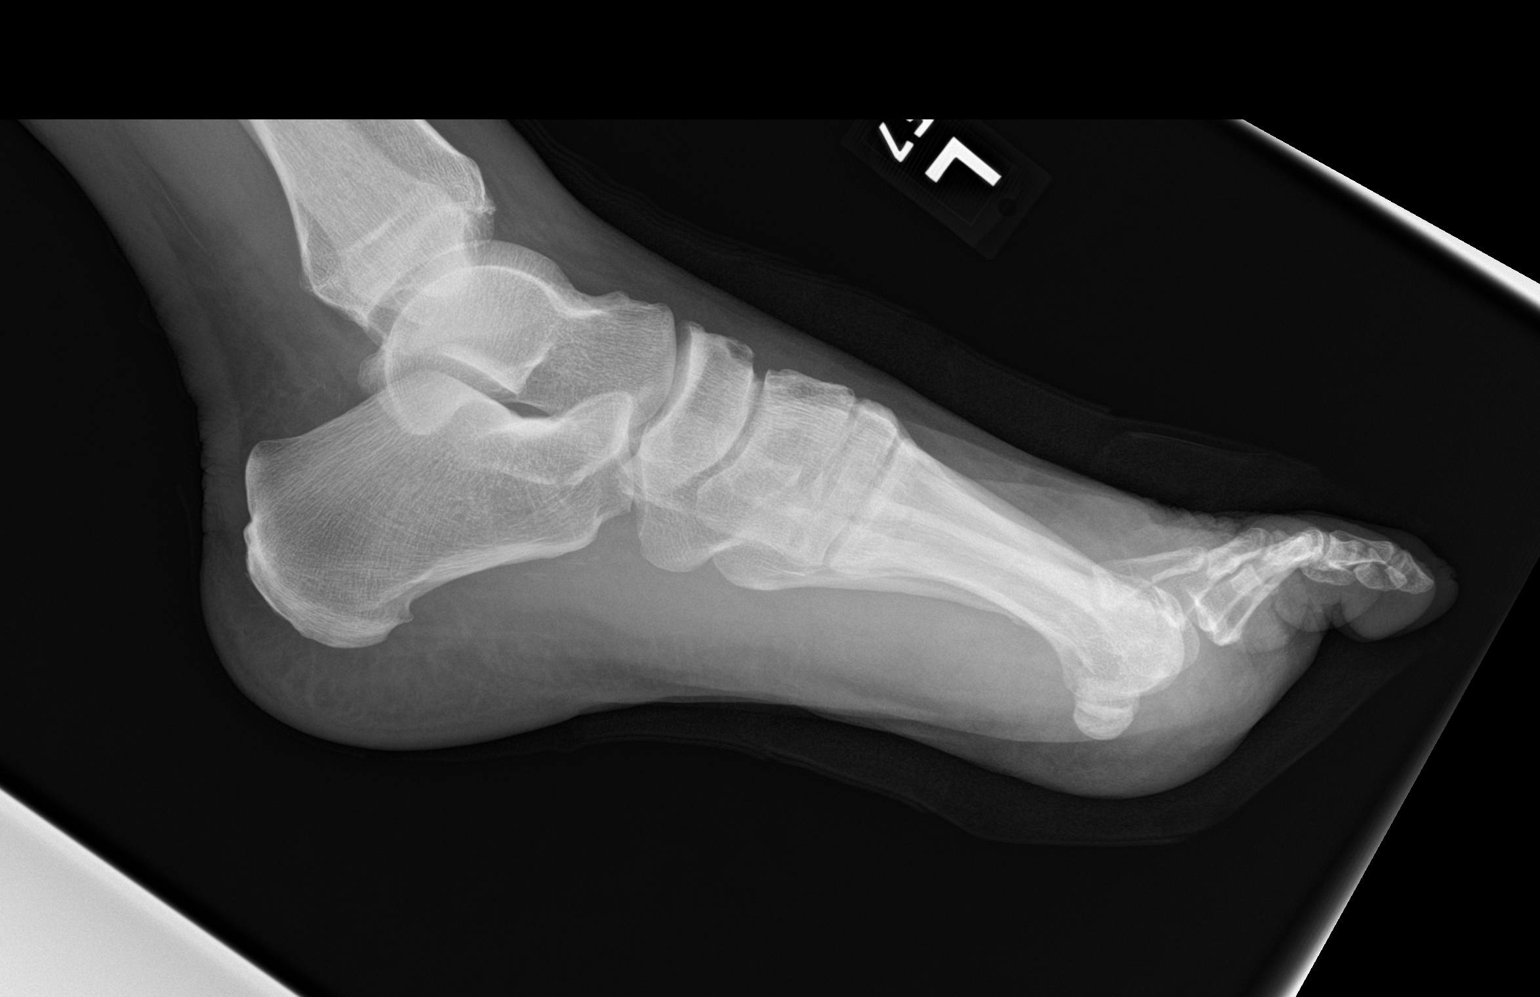

[3 of 3 positions shown; findings below may reference images not displayed]

FINDINGS: Interval great toe amputation. First metatarsal head articular
surface is smooth. No fracture or other acute osseous abnormality.
IMPRESSION: Interval great toe amputation. No immediate postoperative
complication.

## 2022-09-23 ENCOUNTER — Ambulatory Visit (INDEPENDENT_AMBULATORY_CARE_PROVIDER_SITE_OTHER): Payer: Medicare Other | Admitting: Podiatry

## 2022-09-23 DIAGNOSIS — T148XXA Other injury of unspecified body region, initial encounter: Secondary | ICD-10-CM

## 2022-09-23 DIAGNOSIS — B351 Tinea unguium: Secondary | ICD-10-CM

## 2022-09-23 DIAGNOSIS — M79674 Pain in right toe(s): Secondary | ICD-10-CM | POA: Diagnosis not present

## 2022-09-23 DIAGNOSIS — M79675 Pain in left toe(s): Secondary | ICD-10-CM | POA: Diagnosis not present

## 2022-09-23 DIAGNOSIS — M2042 Other hammer toe(s) (acquired), left foot: Secondary | ICD-10-CM

## 2022-09-23 MED ORDER — CEPHALEXIN 500 MG PO CAPS: 500.0000 mg | ORAL_CAPSULE | Freq: Three times a day (TID) | ORAL | 0 refills | Status: AC

## 2022-09-23 NOTE — Patient Instructions (Signed)
Monitor for any signs/symptoms of infection. Call the office immediately if any occur or go directly to the emergency room. Call with any questions/concerns.  

## 2022-09-25 NOTE — Progress Notes (Signed)
Subjective: Chief Complaint  Patient presents with   Foot Problem    diabetic with spot on toe and follow up on ulcer on right heel, right foot has healed significantly well, patient noticed spot on left foot about 1 week ago, would like a nail trim    67 year old male presents the office for above concerns.  He states the wound on the right heel is healed but he has a new concern of a blister on the right fifth toe for which he presents for today.  Denies any drainage or pus.  Not sure how it started.  No open wounds that he reports.  Also his nails are thickened elongated has difficulty trimming continues to have them trimmed today. Redness or any drainage to the toenail sites.  Objective: AAO x3, NAD DP/PT pulses palpable bilaterally, CRT less than 3 seconds Sensation decreased with Semmes Weinstein monofilament Ulceration on the right heel is healed.  There are some loss of the fat pad in this area.  Clear blister noted to the right fifth toe.  Upon draining this only clear fluid expressed there is no purulence.  There is no crepitation.  No ascending cellulitis. 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 No surrounding redness or drainage. Tenderness nails 1-5 bilaterally except the left 1st which has been amputated. Hammertoes present.  No pain with calf compression, swelling, warmth, erythema  Assessment: 67 year old male blister right fifth toe, heel ulceration right foot, symptomatic onychomycosis  Plan: -All treatment options discussed with the patient including all alternatives, risks, complications.  -I cleaned the blister with alcohol I did drain it and only clear fluid was expressed.  There is no purulence. Keflex -Nails are debrided x 9 without complications or bleeding -Daily foot inspection, glucose control -Patient encouraged to call the office with any questions, concerns, change in symptoms.    Trula Slade DPM

## 2022-10-02 ENCOUNTER — Ambulatory Visit: Payer: Medicare Other | Admitting: Podiatry

## 2022-10-03 ENCOUNTER — Ambulatory Visit (INDEPENDENT_AMBULATORY_CARE_PROVIDER_SITE_OTHER): Payer: Medicare Other | Admitting: Podiatry

## 2022-10-03 DIAGNOSIS — L97512 Non-pressure chronic ulcer of other part of right foot with fat layer exposed: Secondary | ICD-10-CM | POA: Diagnosis not present

## 2022-10-03 MED ORDER — AMOXICILLIN-POT CLAVULANATE 875-125 MG PO TABS: 1.0000 | ORAL_TABLET | Freq: Two times a day (BID) | ORAL | 0 refills | Status: AC

## 2022-10-03 NOTE — Progress Notes (Signed)
Subjective:  Patient ID: ELIM PIEL, male    DOB: 03/21/1956,  MRN: [id number]  Chief Complaint  Patient presents with   Follow-up    Patient is concerned about a blister that is on his fifth toe right foot.    67 y.o. male presents with concern for a new blister that has developed on the fifth toe of his right foot. He states this has been present for a week or two. Was taking antibiotics and it seemed to get better with less redness and drainage. He lanced the blister and drained some clear fluid. Refilled initially then subsided.    Past Medical History:  Diagnosis Date   Diabetes mellitus without complication (HCC)    Hypertension     Allergies  Allergen Reactions   Doxycycline     ROS: Negative except as per HPI above  Objective:  General: AAO x3, NAD  Dermatological: Attention directed to the lateral aspect of the fifth toe on the right foot there is noted to be a ulceration with necrotic tissue overlying consistent with prior serous blister that has ruptured.  Upon debridement there is noted to be an underlying ulceration along the subcutaneous fat tissue.  Postdebridement measures 1 x 0.7 x 0.2 cm.  No pain with debridement.  There is mild bleeding from the wound margins.  Overall the wound bed is relatively healthy with some fibrotic and granular tissue present.  Wound does not probe deep to bone  Vascular:  Dorsalis Pedis artery and Posterior Tibial artery pedal pulses are 2/4 bilateral.  Capillary fill time < 3 sec to all digits.   Neruologic: Diminished sensation to light touch protective sensation absent  Musculoskeletal: No gross boney pedal deformities bilateral. No pain, crepitus, or limitation noted with foot and ankle range of motion bilateral. Muscular strength 5/5 in all groups tested bilateral.  Gait: Unassisted, Nonantalgic.     Radiographs:  Deferred Assessment:   1. Skin ulcer of toe of right foot with fat layer exposed      Plan:  Patient  was evaluated and treated and all questions answered.  Ulcer right lateral fifth toe to the level of subcutaneous fat tissue with mild cellulitis -We discussed the etiology and factors that are a part of the wound healing process.  We also discussed the risk of infection both soft tissue and osteomyelitis from open ulceration.  Discussed the risk of limb loss if this happens or worsens. -Debridement as below. -Rx for Augmentin 875-125 twice daily x 7 days -Dressed with Betadine, DSD. -Continue home dressing changes daily with Betadine and adhesive bandage -Vascular testing deferred as patient does have pedal pulses palpable -Last antibiotics: Augmentin started today previously on cephalexin -Imaging: Deferred  Procedure: Excisional Debridement of Wound Rationale: Removal of non-viable soft tissue from the wound to promote healing.  Anesthesia: none Post-Debridement Wound Measurements: 1 cm x 0.7 cm x 0.2 cm  Type of Debridement: Sharp Excisional Tissue Removed: Non-viable soft tissue Depth of Debridement: subcutaneous tissue. Technique: Sharp excisional debridement to bleeding, viable wound base.  Dressing: Dry, sterile, compression dressing. Disposition: Patient tolerated procedure well.   Return in about 2 weeks (around 10/17/2022) for f/u R 5th toe ulcer.       Return in about 2 weeks (around 10/17/2022) for f/u R 5th toe ulcer.          Everitt Amber, DPM [facility]

## 2022-10-17 ENCOUNTER — Ambulatory Visit (INDEPENDENT_AMBULATORY_CARE_PROVIDER_SITE_OTHER): Payer: Medicare Other | Admitting: Podiatry

## 2022-10-17 DIAGNOSIS — Z91199 Patient's noncompliance with other medical treatment and regimen due to unspecified reason: Secondary | ICD-10-CM

## 2022-10-17 NOTE — Progress Notes (Signed)
Pt was a no show for apt, charge generated 

## 2023-03-13 ENCOUNTER — Ambulatory Visit (INDEPENDENT_AMBULATORY_CARE_PROVIDER_SITE_OTHER): Payer: Medicare Other | Admitting: Podiatry

## 2023-03-13 ENCOUNTER — Encounter: Payer: Self-pay | Admitting: Podiatry

## 2023-03-13 DIAGNOSIS — B351 Tinea unguium: Secondary | ICD-10-CM

## 2023-03-13 DIAGNOSIS — M79674 Pain in right toe(s): Secondary | ICD-10-CM

## 2023-03-13 DIAGNOSIS — L84 Corns and callosities: Secondary | ICD-10-CM

## 2023-03-13 DIAGNOSIS — M2041 Other hammer toe(s) (acquired), right foot: Secondary | ICD-10-CM

## 2023-03-13 DIAGNOSIS — M79675 Pain in left toe(s): Secondary | ICD-10-CM

## 2023-03-13 NOTE — Patient Instructions (Signed)

## 2023-03-13 NOTE — Progress Notes (Signed)
Subjective: Chief Complaint  Patient presents with   Diabetes    Patient is here for toe discoloration of right 5th toe, HX amputation of L Hallux, and nail trim   67 year old male presents the office today because he noticed discoloration to his right fifth toe.  He states that it looks much better today than had previously but it was darker today.  Does not report any open lesions.  No fevers or chills.  No other concerns.  Objective: AAO x3, NAD DP/PT pulses palpable bilaterally, CRT less than 3 seconds On the area of concern this is on the dorsal lateral aspect the right fifth toe.  This adductovarus of the toe noted there is no skin breakdown, significant callus formation identified today.  Evaluation of shoes regularly does seem to wear off and brought up the fifth toe as there is breakdown of the shoe itself in this area.  Hammertoes are present.  The nails are hypertrophic, dystrophic and elongated toenails 1 through 5 on the right and 2 through 5 on the left. No pain with calf compression, swelling, warmth, erythema  Assessment: 67 year old male with preulcerative lesion right fifth toe, symptomatic onychomycosis  Plan: -All treatment options discussed with the patient including all alternatives, risks, complications.  -We discussed shoe modifications avoid excess pressure on the right fifth toe given the digital deformity.  Issue does show that he is rubbing in his breakdown of the shoe itself to this area.  Discussed shoe modifications and dispensed offloading pads. -Sharply debrided nails x 9 without any complications or bleeding. -Daily foot inspection and glucose control -Patient encouraged to call the office with any questions, concerns, change in symptoms.   Vivi Barrack DPM
